# Patient Record
Sex: Female | Born: 1975
Health system: Southern US, Community
[De-identification: ages and names within clinical notes are randomized; demographics above are authoritative.]

## PROBLEM LIST (undated history)

## (undated) DIAGNOSIS — F329 Major depressive disorder, single episode, unspecified: Secondary | ICD-10-CM

## (undated) DIAGNOSIS — I2589 Other forms of chronic ischemic heart disease: Secondary | ICD-10-CM

## (undated) DIAGNOSIS — E079 Disorder of thyroid, unspecified: Secondary | ICD-10-CM

## (undated) DIAGNOSIS — I2585 Chronic coronary microvascular dysfunction: Secondary | ICD-10-CM

## (undated) DIAGNOSIS — B019 Varicella without complication: Secondary | ICD-10-CM

## (undated) DIAGNOSIS — F32A Depression, unspecified: Secondary | ICD-10-CM

## (undated) DIAGNOSIS — F419 Anxiety disorder, unspecified: Secondary | ICD-10-CM

## (undated) HISTORY — DX: Major depressive disorder, single episode, unspecified: F32.9

## (undated) HISTORY — DX: Depression, unspecified: F32.A

## (undated) HISTORY — DX: Disorder of thyroid, unspecified: E07.9

## (undated) HISTORY — DX: Varicella without complication: B01.9

## (undated) HISTORY — DX: Other forms of chronic ischemic heart disease: I25.89

## (undated) HISTORY — PX: THYROID SURGERY: SHX805

## (undated) HISTORY — DX: Chronic coronary microvascular dysfunction: I25.85

## (undated) HISTORY — PX: SEPTOPLASTY: SUR1290

## (undated) HISTORY — PX: TUBAL LIGATION: SHX77

## (undated) HISTORY — DX: Anxiety disorder, unspecified: F41.9

---

## 2007-08-15 HISTORY — PX: CHOLECYSTECTOMY: SHX55

## 2014-11-19 ENCOUNTER — Ambulatory Visit: Payer: Self-pay | Admitting: Internal Medicine

## 2014-12-29 ENCOUNTER — Encounter (INDEPENDENT_AMBULATORY_CARE_PROVIDER_SITE_OTHER): Payer: Self-pay

## 2014-12-29 ENCOUNTER — Ambulatory Visit (INDEPENDENT_AMBULATORY_CARE_PROVIDER_SITE_OTHER): Payer: Managed Care, Other (non HMO) | Admitting: Internal Medicine

## 2014-12-29 ENCOUNTER — Encounter: Payer: Self-pay | Admitting: Internal Medicine

## 2014-12-29 VITALS — BP 106/60 | HR 82 | Temp 98.2°F | Wt 237.0 lb

## 2014-12-29 DIAGNOSIS — R079 Chest pain, unspecified: Secondary | ICD-10-CM | POA: Diagnosis not present

## 2014-12-29 DIAGNOSIS — Z91048 Other nonmedicinal substance allergy status: Secondary | ICD-10-CM

## 2014-12-29 DIAGNOSIS — I209 Angina pectoris, unspecified: Secondary | ICD-10-CM

## 2014-12-29 DIAGNOSIS — F32A Depression, unspecified: Secondary | ICD-10-CM

## 2014-12-29 DIAGNOSIS — G43909 Migraine, unspecified, not intractable, without status migrainosus: Secondary | ICD-10-CM | POA: Insufficient documentation

## 2014-12-29 DIAGNOSIS — F329 Major depressive disorder, single episode, unspecified: Secondary | ICD-10-CM | POA: Insufficient documentation

## 2014-12-29 DIAGNOSIS — I2585 Chronic coronary microvascular dysfunction: Secondary | ICD-10-CM

## 2014-12-29 DIAGNOSIS — G43819 Other migraine, intractable, without status migrainosus: Secondary | ICD-10-CM

## 2014-12-29 DIAGNOSIS — G8929 Other chronic pain: Secondary | ICD-10-CM

## 2014-12-29 DIAGNOSIS — F419 Anxiety disorder, unspecified: Secondary | ICD-10-CM | POA: Insufficient documentation

## 2014-12-29 DIAGNOSIS — I2589 Other forms of chronic ischemic heart disease: Secondary | ICD-10-CM | POA: Insufficient documentation

## 2014-12-29 DIAGNOSIS — F418 Other specified anxiety disorders: Secondary | ICD-10-CM

## 2014-12-29 DIAGNOSIS — E079 Disorder of thyroid, unspecified: Secondary | ICD-10-CM | POA: Diagnosis not present

## 2014-12-29 DIAGNOSIS — Z9109 Other allergy status, other than to drugs and biological substances: Secondary | ICD-10-CM | POA: Insufficient documentation

## 2014-12-29 NOTE — Progress Notes (Signed)
Pre visit review using our clinic review tool, if applicable. No additional management support is needed unless otherwise documented below in the visit note. 

## 2014-12-29 NOTE — Assessment & Plan Note (Signed)
No reoccurance Will check TSH and Free T4 at physical

## 2014-12-29 NOTE — Progress Notes (Signed)
HPI  Pt presents to the clinic today to establish care and for management of the conditions listed below. She is transferring care from Dr. Senaida Oresichardson in [place].    Anxiety and Depression: She follows with [facility]. She takes Effexor daily. She is tapering off the Valium at this time. She is using Hydroxyzine daily in its place. She sees her psychiatrist once per month. She does plan on seeing a counselor once a week.  Microvascular heart disease: She follows with [facility] Alden Hippaubert at [facility]. Taking Norvasc, Coreg Zocor and ASA daily. She does have chest pain daily. She is taking the Isosorbide daily. She uses the Nitro 1-2 times a day when needed. She has chest pain on a daily basis but has "learned to live with it".  Chronic chest pain:Secondary to the microvascular disease. She follows with [facility] Azz[facility]Fras at [facility]. She reports the Isosorbide and Nitro are not helping all that much. Her pain management doctor is considering putting her on narcotics but insist that she be weaned off the Valium first.  Thyroid mass: She has had a partial thyroidectomy in 2011. She reports this was a benign mass.  Allergies: They occur all year long. She is taking Allegra daily with good relief.  Migraines: These are intermittent. She takes Topomax daily. She is not sure what triggers it. It is located on the right side of her head. She does have sensitivity to light and sound. She also has nausea and vomiting.   Insomnia: Takes Trazadone daily. Flu: 05/2014 Tetanus: > 10 years ago LMP: 12/28/2014 Pap Smear: She thinks it was in 2012 Dentist: biannually  Past Medical History  Diagnosis Date  . Chicken pox   . Depression   . Cardiac microvascular disease   . Thyroid mass     right side  . Anxiety     Current Outpatient Prescriptions  Medication Sig Dispense Refill  . amLODipine (NORVASC) 10 MG tablet Take 1 tablet by mouth daily.    Marland Kitchen. aspirin EC 81 MG tablet Take 1 tablet by mouth daily.    .  carvedilol (COREG) 25 MG tablet Take 1 tablet by mouth 2 (two) times daily.    . diazepam (VALIUM) 5 MG tablet Take 1 tablet by mouth daily.    Marland Kitchen. EPINEPHrine 0.3 mg/0.3 mL IJ SOAJ injection Inject 0.3 mg into the muscle as needed. For Anaphylaxis    . fexofenadine (ALLEGRA) 180 MG tablet Take 1 tablet by mouth daily.    . hydrOXYzine (ATARAX/VISTARIL) 25 MG tablet Take 1-2 tablets by mouth 3 (three) times daily as needed. For anxiety    . isosorbide dinitrate (ISORDIL) 20 MG tablet Take 1 tablet by mouth 3 (three) times daily.    . nitroGLYCERIN (NITROSTAT) 0.4 MG SL tablet Place 0.4 mg under the tongue every 5 (five) minutes as needed for chest pain.    . simvastatin (ZOCOR) 40 MG tablet Take 1 tablet by mouth daily.    Marland Kitchen. topiramate (TOPAMAX) 25 MG tablet Take 3 tablets by mouth at bedtime. Increase as instructed    . traZODone (DESYREL) 100 MG tablet Take 1.5 tablets by mouth at bedtime.    Marland Kitchen. venlafaxine XR (EFFEXOR-XR) 75 MG 24 hr capsule Take 3 capsules by mouth daily.     No current facility-administered medications for this visit.    Allergies  Allergen Reactions  . Fentanyl Swelling  . Mirtazapine Swelling  . Penicillins Anaphylaxis  . Sulfa Antibiotics Hives  . Ms Contin  [Morphine]  Swelling    Family History  Problem Relation Age of Onset  . Depression Sister   . Anxiety disorder Sister   . Cancer Paternal Uncle     Lung  . Depression Maternal Grandmother   . Heart disease Paternal Grandmother   . Stroke Paternal Grandmother   . Diabetes Paternal Grandmother   . Diabetes Paternal Grandfather     History   Social History  . Marital Status: Married    Spouse Name: N/A  . Number of Children: N/A  . Years of Education: N/A   Occupational History  . Not on file.   Social History Main Topics  . Smoking status: Current Every Day Smoker -- 0.50 packs/day    Types: Cigarettes  . Smokeless tobacco: Never Used  . Alcohol Use: 0.0 oz/week    0 Standard drinks or  equivalent per week     Comment: occasional  . Drug Use: No  . Sexual Activity: Not on file   Other Topics Concern  . Not on file   Social History Narrative  . No narrative on file    ROS:  Constitutional: Pt reports fatigue. Denies fever, malaise,  or abrupt weight changes.  HEENT: Denies eye pain, eye redness, ear pain, ringing in the ears, wax buildup, runny nose, nasal congestion, bloody nose, or sore throat. Respiratory: Denies difficulty breathing, shortness of breath, cough or sputum production.   Cardiovascular: Pt reports chest pain. Denies chest pain, chest tightness, palpitations or swelling in the hands or feet.  Gastrointestinal: Denies abdominal pain, bloating, constipation, diarrhea or blood in the stool.  Skin: Denies redness, rashes, lesions or ulcercations.  Neurological: Denies dizziness, difficulty with memory, difficulty with speech or problems with balance and coordination.  Psych: Pt reports anxiety and depression. Denies SI/HI.  No other specific complaints in a complete review of systems (except as listed in HPI above).  PE:  BP 106/60 mmHg  Pulse 82  Temp(Src) 98.2 F (36.8 C) (Oral)  Wt 237 lb (107.502 kg)  SpO2 98%  LMP 12/28/2014  Wt Readings from Last 3 Encounters:  12/29/14 237 lb (107.502 kg)    General: Appears her stated age, obesein NAD. HEENT: Head: normal shape and size; Eyes: sclera white, no icterus, conjunctiva pink, PERRLA and EOMs intact;  Neck:  Neck supple, trachea midline. No masses, lumps or thyromegaly present.  Cardiovascular: Normal rate and rhythm. S1,S2 noted.  No murmur, rubs or gallops noted. Pulmonary/Chest: Normal effort and positive vesicular breath sounds. No respiratory distress. No wheezes, rales or ronchi noted.  Neurological: Alert and oriented. Psychiatric: Mood and affect mildly flat. Behavior is normal. Judgment and thought content normal.     Assessment and Plan:

## 2014-12-29 NOTE — Assessment & Plan Note (Signed)
Not well controlled Continue to follow with pain management

## 2014-12-29 NOTE — Assessment & Plan Note (Signed)
Controlled on Topomax Advised her to try to keep a headache log so that we can identify some of her triggers

## 2014-12-29 NOTE — Patient Instructions (Signed)

## 2014-12-29 NOTE — Assessment & Plan Note (Signed)
She reports this is not well controlled on her current meds She has a follow up with her psychiatrist next week She will make an appt with Dr. Laymond PurserPerrin and start seeing her weekly

## 2014-12-29 NOTE — Assessment & Plan Note (Signed)
Continue Allegra daily 

## 2015-01-03 ENCOUNTER — Other Ambulatory Visit: Payer: Self-pay | Admitting: Internal Medicine

## 2015-01-03 DIAGNOSIS — Z0000 Encounter for general adult medical examination without abnormal findings: Secondary | ICD-10-CM

## 2015-01-04 ENCOUNTER — Other Ambulatory Visit (INDEPENDENT_AMBULATORY_CARE_PROVIDER_SITE_OTHER): Payer: Managed Care, Other (non HMO)

## 2015-01-04 DIAGNOSIS — Z0000 Encounter for general adult medical examination without abnormal findings: Secondary | ICD-10-CM

## 2015-01-04 LAB — COMPREHENSIVE METABOLIC PANEL
ALT: 10 U/L (ref 0–35)
AST: 14 U/L (ref 0–37)
Albumin: 4.1 g/dL (ref 3.5–5.2)
Alkaline Phosphatase: 62 U/L (ref 39–117)
BUN: 14 mg/dL (ref 6–23)
CO2: 26 mEq/L (ref 19–32)
CREATININE: 1.03 mg/dL (ref 0.40–1.20)
Calcium: 9.1 mg/dL (ref 8.4–10.5)
Chloride: 109 mEq/L (ref 96–112)
GFR: 63.53 mL/min (ref >60.00)
Glucose, Bld: 92 mg/dL (ref 70–99)
Potassium: 4 mEq/L (ref 3.5–5.1)
Sodium: 140 mEq/L (ref 135–145)
TOTAL PROTEIN: 7.2 g/dL (ref 6.0–8.3)
Total Bilirubin: 0.4 mg/dL (ref 0.2–1.2)

## 2015-01-04 LAB — LIPID PANEL
Cholesterol: 163 mg/dL (ref 0–200)
HDL: 57.7 mg/dL (ref >39.00)
LDL Cholesterol: 70 mg/dL (ref 0–99)
NONHDL: 105.3
Total CHOL/HDL Ratio: 3
Triglycerides: 175 mg/dL — ABNORMAL HIGH (ref 0.0–149.0)
VLDL: 35 mg/dL (ref 0.0–40.0)

## 2015-01-04 LAB — TSH: TSH: 1.48 u[IU]/mL (ref 0.35–4.50)

## 2015-01-04 LAB — HEMOGLOBIN A1C: Hgb A1c MFr Bld: 5.3 % (ref 4.6–6.5)

## 2015-01-04 LAB — CBC
HCT: 39.3 % (ref 36.0–46.0)
Hemoglobin: 13.4 g/dL (ref 12.0–15.0)
MCHC: 34.1 g/dL (ref 30.0–36.0)
MCV: 95.4 fl (ref 78.0–100.0)
PLATELETS: 337 10*3/uL (ref 150.0–400.0)
RBC: 4.12 Mil/uL (ref 3.87–5.11)
RDW: 13.6 % (ref 11.5–15.5)
WBC: 7 10*3/uL (ref 4.0–10.5)

## 2015-01-05 ENCOUNTER — Encounter: Payer: Managed Care, Other (non HMO) | Admitting: Internal Medicine

## 2015-01-06 ENCOUNTER — Other Ambulatory Visit (HOSPITAL_COMMUNITY)
Admission: RE | Admit: 2015-01-06 | Discharge: 2015-01-06 | Disposition: A | Discharge status: Home / Self Care | Payer: Managed Care, Other (non HMO) | Source: Ambulatory Visit | Attending: Internal Medicine | Admitting: Internal Medicine

## 2015-01-06 ENCOUNTER — Ambulatory Visit (INDEPENDENT_AMBULATORY_CARE_PROVIDER_SITE_OTHER): Payer: Managed Care, Other (non HMO) | Admitting: Internal Medicine

## 2015-01-06 ENCOUNTER — Encounter: Payer: Self-pay | Admitting: Internal Medicine

## 2015-01-06 VITALS — BP 108/70 | HR 100 | Temp 98.5°F | Ht 66.0 in | Wt 233.0 lb

## 2015-01-06 DIAGNOSIS — Z23 Encounter for immunization: Secondary | ICD-10-CM | POA: Diagnosis not present

## 2015-01-06 DIAGNOSIS — Z1151 Encounter for screening for human papillomavirus (HPV): Secondary | ICD-10-CM | POA: Diagnosis present

## 2015-01-06 DIAGNOSIS — R0989 Other specified symptoms and signs involving the circulatory and respiratory systems: Secondary | ICD-10-CM

## 2015-01-06 DIAGNOSIS — Z01419 Encounter for gynecological examination (general) (routine) without abnormal findings: Secondary | ICD-10-CM | POA: Insufficient documentation

## 2015-01-06 DIAGNOSIS — R198 Other specified symptoms and signs involving the digestive system and abdomen: Secondary | ICD-10-CM

## 2015-01-06 DIAGNOSIS — Z124 Encounter for screening for malignant neoplasm of cervix: Secondary | ICD-10-CM | POA: Diagnosis not present

## 2015-01-06 DIAGNOSIS — Z0000 Encounter for general adult medical examination without abnormal findings: Secondary | ICD-10-CM | POA: Diagnosis not present

## 2015-01-06 MED ORDER — SELENIUM SULFIDE 2.25 % EX SHAM: 1.0000 "application " | MEDICATED_SHAMPOO | Freq: Every day | CUTANEOUS | Status: DC | PRN

## 2015-01-06 NOTE — Patient Instructions (Signed)

## 2015-01-06 NOTE — Progress Notes (Signed)
Pre visit review using our clinic review tool, if applicable. No additional management support is needed unless otherwise documented below in the visit note. 

## 2015-01-06 NOTE — Progress Notes (Signed)
Subjective:    Patient ID: Dana Miller, female    DOB: [birth year], 39 y.o.   MRN: [id number]  HPI  Pt presents to the clinic today for her annual exam.  Flu: 05/2014 Tetanus: > 10 years ago Pap Smear: she thinks it was in 2012 Dentist: biannually  Review of Systems      Past Medical History  Diagnosis Date  . Chicken pox   . Depression   . Cardiac microvascular disease   . Thyroid mass     right side  . Anxiety     Current Outpatient Prescriptions  Medication Sig Dispense Refill  . amLODipine (NORVASC) 10 MG tablet Take 1 tablet by mouth daily.    Marland Kitchen aspirin EC 81 MG tablet Take 1 tablet by mouth daily.    . carvedilol (COREG) 25 MG tablet Take 1 tablet by mouth 2 (two) times daily.    . diazepam (VALIUM) 5 MG tablet Take 1 tablet by mouth daily.    Marland Kitchen EPINEPHrine 0.3 mg/0.3 mL IJ SOAJ injection Inject 0.3 mg into the muscle as needed. For Anaphylaxis    . fexofenadine (ALLEGRA) 180 MG tablet Take 1 tablet by mouth daily.    . isosorbide dinitrate (ISORDIL) 20 MG tablet Take 1 tablet by mouth 3 (three) times daily.    . nitroGLYCERIN (NITROSTAT) 0.4 MG SL tablet Place 0.4 mg under the tongue every 5 (five) minutes as needed for chest pain.    Marland Kitchen QUEtiapine (SEROQUEL) 50 MG tablet Take 25-50mg  twice a day as needed for anxiety/panic attacks. Take  nightly for insomnia.    Marland Kitchen simvastatin (ZOCOR) 40 MG tablet Take 1 tablet by mouth daily.    Marland Kitchen topiramate (TOPAMAX) 25 MG tablet Take 3 tablets by mouth at bedtime. Increase as instructed    . venlafaxine XR (EFFEXOR-XR) 75 MG 24 hr capsule Take 3 capsules by mouth daily.     No current facility-administered medications for this visit.    Allergies  Allergen Reactions  . Fentanyl Swelling  . Mirtazapine Swelling  . Penicillins Anaphylaxis  . Sulfa Antibiotics Hives  . Ms Contin  [Morphine] Swelling    Family History  Problem Relation Age of Onset  . Depression Sister   . Anxiety disorder Sister   . Cancer  Paternal Uncle     Lung  . Depression Maternal Grandmother   . Heart disease Paternal Grandmother   . Stroke Paternal Grandmother   . Diabetes Paternal Grandmother   . Diabetes Paternal Grandfather     History   Social History  . Marital Status: Married    Spouse Name: N/A  . Number of Children: N/A  . Years of Education: N/A   Occupational History  . Not on file.   Social History Main Topics  . Smoking status: Current Every Day Smoker -- 0.50 packs/day    Types: Cigarettes  . Smokeless tobacco: Never Used  . Alcohol Use: 0.0 oz/week    0 Standard drinks or equivalent per week     Comment: occasional  . Drug Use: No  . Sexual Activity: Yes   Other Topics Concern  . Not on file   Social History Narrative     Constitutional: Pt reports fatigue. Denies fever, malaise, headache or abrupt weight changes.  HEENT: Denies eye pain, eye redness, ear pain, ringing in the ears, wax buildup, runny nose, nasal congestion, bloody nose, or sore throat. Respiratory: Denies difficulty breathing, shortness of breath, cough or sputum production.   Cardiovascular: Pt  reports chronic chest pain. Denies chest tightness, palpitations or swelling in the hands or feet.  Gastrointestinal: Denies abdominal pain, bloating, constipation, diarrhea or blood in the stool.  GU: Denies urgency, frequency, pain with urination, burning sensation, blood in urine, odor or discharge. Musculoskeletal: Denies decrease in range of motion, difficulty with gait, muscle pain or joint pain and swelling.  Skin: Denies redness, rashes, lesions or ulcercations.  Neurological: Denies dizziness, difficulty with memory, difficulty with speech or problems with balance and coordination.  Psych: Pt reports anxiety and depression. Denies SI/HI.  No other specific complaints in a complete review of systems (except as listed in HPI above).  Objective:   Physical Exam  BP 108/70 mmHg  Pulse 100  Temp(Src) 98.5 F (36.9  C) (Oral)  Ht 5\' 6"  (1.676 m)  Wt 233 lb (105.688 kg)  BMI 37.63 kg/m2  SpO2 98%  LMP 12/28/2014 Wt Readings from Last 3 Encounters:  01/06/15 233 lb (105.688 kg)  12/29/14 237 lb (107.502 kg)    General: Appears her stated age, obese in NAD. Skin: Warm, dry and intact. No rashes or ulcerations noted. HEENT: Head: normal shape and size; Eyes: sclera white, no icterus, conjunctiva pink, PERRLA and EOMs intact; Ears: Tm's gray and intact, normal light reflex; Throat/Mouth: Teeth present, mucosa pink and moist, no exudate, lesions or ulcerations noted. Small growth noted on right tonsil. Neck: Neck supple, trachea midline. No masses, lumps or thyromegaly present. Surgical scar noted on anterior neck. Cardiovascular: Normal rate and rhythm. S1,S2 noted.  No murmur, rubs or gallops noted. No JVD or BLE edema.  Pulmonary/Chest: Normal effort and positive vesicular breath sounds. No respiratory distress. No wheezes, rales or ronchi noted.  Abdomen: Soft and nontender. Normal bowel sounds, no bruits noted. No distention or masses noted. Liver, spleen and kidneys non palpable. Musculoskeletal: Strength 5/5 BUE/BLE. No signs of joint swelling. No difficulty with gait.  Neurological: Alert and oriented. Cranial nerves II-XII grossly intact. Coordination normal.  Psychiatric: Mood and affect mildly flat. Behavior is normal. Judgment and thought content normal.     BMET    Component Value Date/Time   NA 140 01/04/2015 1122   K 4.0 01/04/2015 1122   CL 109 01/04/2015 1122   CO2 26 01/04/2015 1122   GLUCOSE 92 01/04/2015 1122   BUN 14 01/04/2015 1122   CREATININE 1.03 01/04/2015 1122   CALCIUM 9.1 01/04/2015 1122    Lipid Panel     Component Value Date/Time   CHOL 163 01/04/2015 1122   TRIG 175.0* 01/04/2015 1122   HDL 57.70 01/04/2015 1122   CHOLHDL 3 01/04/2015 1122   VLDL 35.0 01/04/2015 1122   LDLCALC 70 01/04/2015 1122    CBC    Component Value Date/Time   WBC 7.0 01/04/2015  1122   RBC 4.12 01/04/2015 1122   HGB 13.4 01/04/2015 1122   HCT 39.3 01/04/2015 1122   PLT 337.0 01/04/2015 1122   MCV 95.4 01/04/2015 1122   MCHC 34.1 01/04/2015 1122   RDW 13.6 01/04/2015 1122    Hgb A1C Lab Results  Component Value Date   HGBA1C 5.3 01/04/2015         Assessment & Plan:   Preventative Health Maintenance:  CBC, CMET, Lipid, TSH and A1C reviewed, slightly elevated triglycerides, advised her to work on low fat diet and start taking fish oil daily Tdap today Pap Smear today, will call you with the results All other HM UTD  Tonsil growth:  Will place referral to ENT  to r/o neoplasm  RTC in 1 year or sooner if needed

## 2015-01-06 NOTE — Addendum Note (Signed)
Addended by: Roena MaladyEVONTENNO, MELANIE Y on: 01/06/2015 04:06 PM   Modules accepted: Orders

## 2015-01-06 NOTE — Addendum Note (Signed)
Addended by: Roena MaladyEVONTENNO, Danyell Awbrey Y on: 01/06/2015 04:04 PM   Modules accepted: Orders

## 2015-01-12 LAB — CYTOLOGY - PAP

## 2015-02-03 ENCOUNTER — Ambulatory Visit (INDEPENDENT_AMBULATORY_CARE_PROVIDER_SITE_OTHER): Payer: 59 | Admitting: Psychology

## 2015-02-03 DIAGNOSIS — F332 Major depressive disorder, recurrent severe without psychotic features: Secondary | ICD-10-CM | POA: Diagnosis not present

## 2015-02-17 ENCOUNTER — Ambulatory Visit (INDEPENDENT_AMBULATORY_CARE_PROVIDER_SITE_OTHER): Payer: 59 | Admitting: Psychology

## 2015-02-17 DIAGNOSIS — F4323 Adjustment disorder with mixed anxiety and depressed mood: Secondary | ICD-10-CM | POA: Diagnosis not present

## 2015-03-04 ENCOUNTER — Ambulatory Visit: Payer: 59 | Admitting: Psychology

## 2015-03-31 ENCOUNTER — Ambulatory Visit (INDEPENDENT_AMBULATORY_CARE_PROVIDER_SITE_OTHER): Payer: 59 | Admitting: Psychology

## 2015-03-31 DIAGNOSIS — F323 Major depressive disorder, single episode, severe with psychotic features: Secondary | ICD-10-CM

## 2015-04-06 ENCOUNTER — Ambulatory Visit (INDEPENDENT_AMBULATORY_CARE_PROVIDER_SITE_OTHER): Payer: 59 | Admitting: Psychology

## 2015-04-06 DIAGNOSIS — F4323 Adjustment disorder with mixed anxiety and depressed mood: Secondary | ICD-10-CM

## 2015-04-13 ENCOUNTER — Ambulatory Visit: Payer: 59 | Admitting: Psychology

## 2015-04-20 ENCOUNTER — Ambulatory Visit: Payer: Managed Care, Other (non HMO) | Admitting: Internal Medicine

## 2015-04-20 ENCOUNTER — Telehealth: Payer: Self-pay | Admitting: Internal Medicine

## 2015-04-20 NOTE — Telephone Encounter (Signed)
No follow up needed

## 2015-04-20 NOTE — Telephone Encounter (Signed)
Pt did not come in for their appt today for acute visit. Please let me know if pt needs to be contacted immediately for follow up or no follow up needed. Best phone number to contact pt is [phone].

## 2015-04-21 ENCOUNTER — Ambulatory Visit (INDEPENDENT_AMBULATORY_CARE_PROVIDER_SITE_OTHER): Payer: Self-pay | Admitting: Internal Medicine

## 2015-04-21 ENCOUNTER — Ambulatory Visit: Payer: 59 | Admitting: Psychology

## 2015-04-21 ENCOUNTER — Encounter: Payer: Self-pay | Admitting: Internal Medicine

## 2015-04-21 VITALS — BP 112/70 | HR 91 | Temp 98.6°F | Wt 237.0 lb

## 2015-04-21 DIAGNOSIS — E669 Obesity, unspecified: Secondary | ICD-10-CM

## 2015-04-21 DIAGNOSIS — E079 Disorder of thyroid, unspecified: Secondary | ICD-10-CM

## 2015-04-21 NOTE — Patient Instructions (Signed)

## 2015-04-21 NOTE — Progress Notes (Signed)
Pre visit review using our clinic review tool, if applicable. No additional management support is needed unless otherwise documented below in the visit note. 

## 2015-04-21 NOTE — Progress Notes (Signed)
Subjective:    Patient ID: Dana Miller, female    DOB: 1976/05/31, 39 y.o.   MRN: [id number]  HPI  Pt presents to the clinic today with c/o weight gain. This started 1.5 years ago and has plateud in the last 6 months. She feels like her appetite is decreased but she continues to gain weight. Most days she only eats 1 meal per day. She cut sodas out her diet. She does not consume junk food. Her weight today is 237 with a BMI of 38.25. She is not exercising at this time.   Breakfast: 1 cup of coffee Lunch: Typically does not eat Dinner: Chicken, potato's, rice.   She does not eat out. She does not cook with butter or salt. She does not fry foods. She does not understand why she eats so little and still does not loose weight.  She is also concerned about a possible mass on her thyroid. She noticed this about 6 months ago but has noticed that it has gotten bigger. She feels like it is putting pressure on her esophagus. She has had the right side of her thyroid removed secondary to a mass. Her TSH was checked a few months ago and was normal.  Review of Systems      Past Medical History  Diagnosis Date  . Chicken pox   . Depression   . Cardiac microvascular disease   . Thyroid mass     right side  . Anxiety     Current Outpatient Prescriptions  Medication Sig Dispense Refill  . amLODipine (NORVASC) 10 MG tablet Take 1 tablet by mouth daily.    Marland Kitchen aspirin EC 81 MG tablet Take 1 tablet by mouth daily.    Marland Kitchen atorvastatin (LIPITOR) 20 MG tablet Take 1 tablet by mouth daily.  11  . carvedilol (COREG) 25 MG tablet Take 1 tablet by mouth 2 (two) times daily.    Marland Kitchen EPINEPHrine 0.3 mg/0.3 mL IJ SOAJ injection Inject 0.3 mg into the muscle as needed. For Anaphylaxis    . fexofenadine (ALLEGRA) 180 MG tablet Take 1 tablet by mouth daily.    . isosorbide dinitrate (ISORDIL) 20 MG tablet Take 1 tablet by mouth 3 (three) times daily.    . nitroGLYCERIN (NITROSTAT) 0.4 MG SL tablet Place 0.4 mg  under the tongue every 5 (five) minutes as needed for chest pain.    Marland Kitchen QUEtiapine (SEROQUEL) 50 MG tablet Take 25-50mg  twice a day as needed for anxiety/panic attacks. Take  nightly for insomnia.    Marland Kitchen RANEXA 500 MG 12 hr tablet Take 1 tablet by mouth 2 (two) times daily.  11  . Selenium Sulfide 2.25 % SHAM Apply 1 application topically daily as needed. 1 Bottle 3  . topiramate (TOPAMAX) 25 MG tablet Take 3 tablets by mouth at bedtime. Increase as instructed    . venlafaxine XR (EFFEXOR-XR) 75 MG 24 hr capsule Take 3 capsules by mouth daily.     No current facility-administered medications for this visit.    Allergies  Allergen Reactions  . Fentanyl Swelling  . Mirtazapine Swelling  . Penicillins Anaphylaxis  . Sulfa Antibiotics Hives  . Ms Contin  [Morphine] Swelling    Family History  Problem Relation Age of Onset  . Depression Sister   . Anxiety disorder Sister   . Cancer Paternal Uncle     Lung  . Depression Maternal Grandmother   . Heart disease Paternal Grandmother   . Stroke Paternal Grandmother   .  Diabetes Paternal Grandmother   . Diabetes Paternal Grandfather     Social History   Social History  . Marital Status: Married    Spouse Name: N/A  . Number of Children: N/A  . Years of Education: N/A   Occupational History  . Not on file.   Social History Main Topics  . Smoking status: Current Every Day Smoker -- 0.50 packs/day    Types: Cigarettes  . Smokeless tobacco: Never Used  . Alcohol Use: 0.0 oz/week    0 Standard drinks or equivalent per week     Comment: occasional  . Drug Use: No  . Sexual Activity: Yes   Other Topics Concern  . Not on file   Social History Narrative     Constitutional: Denies fever, malaise, fatigue, headache or abrupt weight changes.  Respiratory: Denies difficulty breathing, shortness of breath, cough or sputum production.   Cardiovascular: Denies chest pain, chest tightness, palpitations or swelling in the hands or  feet.  Skin: Pt reports thyroid mass. Denies redness, rashes, lesions or ulcercations.   No other specific complaints in a complete review of systems (except as listed in HPI above).  Objective:   Physical Exam   BP 112/70 mmHg  Pulse 91  Temp(Src) 98.6 F (37 C) (Oral)  Wt 237 lb (107.502 kg)  SpO2 98%  LMP 03/30/2015 Wt Readings from Last 3 Encounters:  04/21/15 237 lb (107.502 kg)  01/06/15 233 lb (105.688 kg)  12/29/14 237 lb (107.502 kg)    General: Appears her stated age, obese in NAD. Neck:  Neck supple, trachea midline. Small, palpable, oval mass noted at top of left thyroid. Cardiovascular: Normal rate and rhythm. S1,S2 noted.   Pulmonary/Chest: Normal effort and positive vesicular breath sounds. No respiratory distress. No wheezes, rales or ronchi noted.  Neurological: Alert and oriented.   BMET    Component Value Date/Time   NA 140 01/04/2015 1122   K 4.0 01/04/2015 1122   CL 109 01/04/2015 1122   CO2 26 01/04/2015 1122   GLUCOSE 92 01/04/2015 1122   BUN 14 01/04/2015 1122   CREATININE 1.03 01/04/2015 1122   CALCIUM 9.1 01/04/2015 1122    Lipid Panel     Component Value Date/Time   CHOL 163 01/04/2015 1122   TRIG 175.0* 01/04/2015 1122   HDL 57.70 01/04/2015 1122   CHOLHDL 3 01/04/2015 1122   VLDL 35.0 01/04/2015 1122   LDLCALC 70 01/04/2015 1122    CBC    Component Value Date/Time   WBC 7.0 01/04/2015 1122   RBC 4.12 01/04/2015 1122   HGB 13.4 01/04/2015 1122   HCT 39.3 01/04/2015 1122   PLT 337.0 01/04/2015 1122   MCV 95.4 01/04/2015 1122   MCHC 34.1 01/04/2015 1122   RDW 13.6 01/04/2015 1122    Hgb A1C Lab Results  Component Value Date   HGBA1C 5.3 01/04/2015        Assessment & Plan:   Palpable mass of thyroid:  Given location, could be a lymph node TSH reviewed Will obtain thyroid ultrasound- see Shirlee Limerick on your way out to schedule  Obesity:  Discussed how eating only once a day decreases her metabolism and stores  fat Encouraged her to eat at least 3-4 small meals per day Advised her to incorporate more fruits and veggies into her diet Referral placed for a nutritionist- see Shirlee Limerick on your way out to schedule  RTC as needed

## 2015-04-26 ENCOUNTER — Ambulatory Visit
Admission: RE | Admit: 2015-04-26 | Discharge: 2015-04-26 | Disposition: A | Discharge status: Home / Self Care | Payer: Self-pay | Source: Ambulatory Visit | Attending: Internal Medicine | Admitting: Internal Medicine

## 2015-04-26 DIAGNOSIS — E041 Nontoxic single thyroid nodule: Secondary | ICD-10-CM | POA: Insufficient documentation

## 2015-04-26 DIAGNOSIS — E079 Disorder of thyroid, unspecified: Secondary | ICD-10-CM

## 2015-06-06 ENCOUNTER — Encounter: Payer: Self-pay | Admitting: Gynecology

## 2015-06-06 ENCOUNTER — Ambulatory Visit
Admission: EM | Admit: 2015-06-06 | Discharge: 2015-06-06 | Disposition: A | Discharge status: Home / Self Care | Payer: BLUE CROSS/BLUE SHIELD | Attending: Family Medicine | Admitting: Family Medicine

## 2015-06-06 ENCOUNTER — Ambulatory Visit (INDEPENDENT_AMBULATORY_CARE_PROVIDER_SITE_OTHER): Payer: BLUE CROSS/BLUE SHIELD

## 2015-06-06 DIAGNOSIS — M79671 Pain in right foot: Secondary | ICD-10-CM

## 2015-06-06 NOTE — ED Provider Notes (Signed)
H and presents today with symptoms of right foot pain for the past 4-6 weeks. Patient denies any injury to the right foot. She does admit that she has gained quite a bit of weight recently and has started to coach more with volleyball. She has pain now that is radiating to the lateral ankle and also to the anterior lower leg area. She denies any swelling or burning/tingling of the foot. She denies any previous injury such as stress fracture to the foot.  ROS: Negative except mentioned above. Vitals as per Epic.  GENERAL: NAD RESP: CTA B CARD: RRR MSK: R Foot-no deformity appreciated, mild pes cavus, tenderness primarily over the 4th-5th MTP region dorsally, mild discomfort along ATFL and anterior tib. area, FROM, nv intact   A/P: R Foot Pain-no obvious bony abnormality noted on x-ray, recommend patient follow up with podiatry for further imaging, workup/treatment. Will put patient in walking boot at this time, patient states that she cannot take NSAIDs due to her heart medications, will take Tylenol for now and ice when necessary. Copy of Xrays given to patient on disc.   Jolene ProvostKirtida Kynslie Ringle, MD 06/06/15 [phone]

## 2015-06-06 NOTE — ED Notes (Signed)
Patient c/o right foot pain x 4-6 weeks. Per pt. No injury to her right foot. Patient stated unable to walk on foot and painful.

## 2015-07-14 ENCOUNTER — Ambulatory Visit (INDEPENDENT_AMBULATORY_CARE_PROVIDER_SITE_OTHER): Payer: BLUE CROSS/BLUE SHIELD | Admitting: Psychology

## 2015-07-14 DIAGNOSIS — F331 Major depressive disorder, recurrent, moderate: Secondary | ICD-10-CM

## 2015-07-20 ENCOUNTER — Encounter: Payer: Self-pay | Admitting: Internal Medicine

## 2015-07-20 ENCOUNTER — Ambulatory Visit (INDEPENDENT_AMBULATORY_CARE_PROVIDER_SITE_OTHER): Payer: BLUE CROSS/BLUE SHIELD | Admitting: Internal Medicine

## 2015-07-20 VITALS — BP 116/70 | HR 87 | Temp 98.4°F | Wt 236.0 lb

## 2015-07-20 DIAGNOSIS — R238 Other skin changes: Secondary | ICD-10-CM | POA: Diagnosis not present

## 2015-07-20 DIAGNOSIS — R233 Spontaneous ecchymoses: Secondary | ICD-10-CM

## 2015-07-20 DIAGNOSIS — M25571 Pain in right ankle and joints of right foot: Secondary | ICD-10-CM | POA: Diagnosis not present

## 2015-07-20 DIAGNOSIS — R635 Abnormal weight gain: Secondary | ICD-10-CM

## 2015-07-20 DIAGNOSIS — E559 Vitamin D deficiency, unspecified: Secondary | ICD-10-CM

## 2015-07-20 DIAGNOSIS — L603 Nail dystrophy: Secondary | ICD-10-CM

## 2015-07-20 LAB — CBC WITH DIFFERENTIAL/PLATELET
BASOS ABS: 0 10*3/uL (ref 0.0–0.1)
Basophils Relative: 0.4 % (ref 0.0–3.0)
EOS PCT: 3.3 % (ref 0.0–5.0)
Eosinophils Absolute: 0.3 10*3/uL (ref 0.0–0.7)
HEMATOCRIT: 36.8 % (ref 36.0–46.0)
HEMOGLOBIN: 12.4 g/dL (ref 12.0–15.0)
LYMPHS PCT: 16.9 % (ref 12.0–46.0)
Lymphs Abs: 1.4 10*3/uL (ref 0.7–4.0)
MCHC: 33.6 g/dL (ref 30.0–36.0)
MCV: 97.6 fl (ref 78.0–100.0)
MONOS PCT: 6 % (ref 3.0–12.0)
Monocytes Absolute: 0.5 10*3/uL (ref 0.1–1.0)
NEUTROS PCT: 73.4 % (ref 43.0–77.0)
Neutro Abs: 6.1 10*3/uL (ref 1.4–7.7)
Platelets: 296 10*3/uL (ref 150.0–400.0)
RBC: 3.77 Mil/uL — AB (ref 3.87–5.11)
RDW: 13.6 % (ref 11.5–15.5)
WBC: 8.3 10*3/uL (ref 4.0–10.5)

## 2015-07-20 LAB — COMPREHENSIVE METABOLIC PANEL
ALBUMIN: 3.7 g/dL (ref 3.5–5.2)
ALK PHOS: 71 U/L (ref 39–117)
ALT: 7 U/L (ref 0–35)
AST: 12 U/L (ref 0–37)
BILIRUBIN TOTAL: 0.3 mg/dL (ref 0.2–1.2)
BUN: 9 mg/dL (ref 6–23)
CO2: 23 meq/L (ref 19–32)
Calcium: 8.7 mg/dL (ref 8.4–10.5)
Chloride: 111 mEq/L (ref 96–112)
Creatinine, Ser: 1.12 mg/dL (ref 0.40–1.20)
GFR: 57.52 mL/min — AB (ref >60.00)
Glucose, Bld: 89 mg/dL (ref 70–99)
Potassium: 3.9 mEq/L (ref 3.5–5.1)
Sodium: 141 mEq/L (ref 135–145)
TOTAL PROTEIN: 6.9 g/dL (ref 6.0–8.3)

## 2015-07-20 LAB — VITAMIN D 25 HYDROXY (VIT D DEFICIENCY, FRACTURES): VITD: 13.27 ng/mL — AB (ref 30.00–100.00)

## 2015-07-20 LAB — TSH: TSH: 2.77 u[IU]/mL (ref 0.35–4.50)

## 2015-07-20 LAB — FOLATE: Folate: 7.5 ng/mL (ref >5.9)

## 2015-07-20 LAB — VITAMIN B12: VITAMIN B 12: 149 pg/mL — AB (ref 211–911)

## 2015-07-20 NOTE — Progress Notes (Signed)
Pre visit review using our clinic review tool, if applicable. No additional management support is needed unless otherwise documented below in the visit note. 

## 2015-07-20 NOTE — Progress Notes (Signed)
Subjective:    Patient ID: Dana Miller, female    DOB: [birth year], 39 y.o.   MRN: [id number]  HPI  Pt presents to the clinic today with multiple concerns. She recently injured her right foot out of nowhere. She was placed in a boot by podiatry after 9 weeks of pain in her right foot. She reports there is no fracture but she is not sure why she would all of a sudden develop pain like this. She also reports easy brusing. She had a bruise on the back of her right leg about the size of a grapefruit. She does not recall hitting her leg on anything. The bruise has resolved. She reports brittle nails. Normally, her nails and long and hard but for some reason, they are breaking off. She also reports that she is unable to lose weight despite the fact that she does not eat that much. She reports that she consumes a balanced diet but she does not exercise. It the last 6 months, she has lost 1 lb.  Review of Systems      Past Medical History  Diagnosis Date  . Chicken pox   . Depression   . Cardiac microvascular disease (HCC)   . Thyroid mass     right side  . Anxiety     Current Outpatient Prescriptions  Medication Sig Dispense Refill  . aspirin EC 81 MG tablet Take 1 tablet by mouth daily.    Marland Kitchen. atorvastatin (LIPITOR) 20 MG tablet Take 1 tablet by mouth daily.  11  . EPINEPHrine 0.3 mg/0.3 mL IJ SOAJ injection Inject 0.3 mg into the muscle as needed. For Anaphylaxis    . fexofenadine (ALLEGRA) 180 MG tablet Take 1 tablet by mouth daily.    . isosorbide dinitrate (ISORDIL) 20 MG tablet Take 1 tablet by mouth 3 (three) times daily.    . nitroGLYCERIN (NITROSTAT) 0.4 MG SL tablet Place 0.4 mg under the tongue every 5 (five) minutes as needed for chest pain.    Marland Kitchen. QUEtiapine (SEROQUEL) 50 MG tablet Take 25-50mg  twice a day as needed for anxiety/panic attacks. Take 50mg  nightly for insomnia.    Marland Kitchen. RANEXA 500 MG 12 hr tablet Take 1 tablet by mouth 2 (two) times daily.  11  . Selenium Sulfide 2.25 %  SHAM Apply 1 application topically daily as needed. 1 Bottle 3  . topiramate (TOPAMAX) 25 MG tablet Take 3 tablets by mouth at bedtime. Increase as instructed    . venlafaxine XR (EFFEXOR-XR) 75 MG 24 hr capsule Take 3 capsules by mouth daily.    Marland Kitchen. amLODipine (NORVASC) 10 MG tablet Take 1 tablet by mouth daily.    . carvedilol (COREG) 25 MG tablet Take 1 tablet by mouth 2 (two) times daily.     No current facility-administered medications for this visit.    Allergies  Allergen Reactions  . Fentanyl Swelling  . Mirtazapine Swelling  . Penicillins Anaphylaxis  . Sulfa Antibiotics Hives  . Ms Contin  [Morphine] Swelling    Family History  Problem Relation Age of Onset  . Depression Sister   . Anxiety disorder Sister   . Cancer Paternal Uncle     Lung  . Depression Maternal Grandmother   . Heart disease Paternal Grandmother   . Stroke Paternal Grandmother   . Diabetes Paternal Grandmother   . Diabetes Paternal Grandfather     Social History   Social History  . Marital Status: Married    Spouse Name: N/A  .  Number of Children: N/A  . Years of Education: N/A   Occupational History  . Not on file.   Social History Main Topics  . Smoking status: Current Every Day Smoker -- 0.50 packs/day    Types: Cigarettes  . Smokeless tobacco: Never Used  . Alcohol Use: 0.0 oz/week    0 Standard drinks or equivalent per week     Comment: occasional  . Drug Use: No  . Sexual Activity: Yes   Other Topics Concern  . Not on file   Social History Narrative     Constitutional: Pt reports weight gain. Denies fever, malaise, fatigue, headache.  Respiratory: Denies difficulty breathing, shortness of breath, cough or sputum production.   Cardiovascular: Denies chest pain, chest tightness, palpitations or swelling in the hands or feet.  Gastrointestinal: Denies abdominal pain, bloating, constipation, diarrhea or blood in the stool.  GU: Denies urgency, frequency, pain with urination,  burning sensation, blood in urine, odor or discharge. Musculoskeletal: Pt reports foot pain. Denies decrease in range of motion, difficulty with gait, muscle pain or joint swelling.  Skin: Pt reports easy bruising and brittle nails. Denies redness, rashes, lesions or ulcercations.  Neurological: Denies dizziness, difficulty with memory, difficulty with speech or problems with balance and coordination.  Psych: Pt reports stress and depression. Denies anxiety, SI/HI.  No other specific complaints in a complete review of systems (except as listed in HPI above).  Objective:   Physical Exam  BP 116/70 mmHg  Pulse 87  Temp(Src) 98.4 F (36.9 C) (Oral)  Wt 236 lb (107.049 kg)  SpO2 98% Wt Readings from Last 3 Encounters:  07/20/15 236 lb (107.049 kg)  06/06/15 235 lb (106.595 kg)  04/21/15 237 lb (107.502 kg)    General: Appears her stated age, obese in NAD. Skin: Warm, dry and intact. Nails are different lengths but they appear strong, no ridges noted. No bruises noted. Cardiovascular: Normal rate and rhythm. S1,S2 noted.  No murmur, rubs or gallops noted. . Pulmonary/Chest: Normal effort and positive vesicular breath sounds. No respiratory distress. No wheezes, rales or ronchi noted.  Musculoskeletal: Unable to assess right foot, in boot.  Neurological: Alert and oriented.  Psychiatric: She seems anxious today.  BMET    Component Value Date/Time   NA 140 01/04/2015 1122   K 4.0 01/04/2015 1122   CL 109 01/04/2015 1122   CO2 26 01/04/2015 1122   GLUCOSE 92 01/04/2015 1122   BUN 14 01/04/2015 1122   CREATININE 1.03 01/04/2015 1122   CALCIUM 9.1 01/04/2015 1122    Lipid Panel     Component Value Date/Time   CHOL 163 01/04/2015 1122   TRIG 175.0* 01/04/2015 1122   HDL 57.70 01/04/2015 1122   CHOLHDL 3 01/04/2015 1122   VLDL 35.0 01/04/2015 1122   LDLCALC 70 01/04/2015 1122    CBC    Component Value Date/Time   WBC 7.0 01/04/2015 1122   RBC 4.12 01/04/2015 1122   HGB  13.4 01/04/2015 1122   HCT 39.3 01/04/2015 1122   PLT 337.0 01/04/2015 1122   MCV 95.4 01/04/2015 1122   MCHC 34.1 01/04/2015 1122   RDW 13.6 01/04/2015 1122    Hgb A1C Lab Results  Component Value Date   HGBA1C 5.3 01/04/2015         Assessment & Plan:   Joint pain, easy bruising, brittle fingernails, weight gain:  Will check CBC, CMET, TSH, Vit D, B12, Folate ? Weight gain d/t seroquel versus effexor  Will follow up after  labs come back, RTC as needed

## 2015-07-20 NOTE — Patient Instructions (Signed)
Vitamin D Deficiency Vitamin D deficiency is when your body does not have enough vitamin D. Vitamin D is important to your body for many reasons:  It helps the body to absorb two important minerals, called calcium and phosphorus.  It plays a role in bone health.  It may help to prevent some diseases, such as diabetes and multiple sclerosis.  It plays a role in muscle function, including heart function. You can get vitamin D by:  Eating foods that naturally contain vitamin D.  Eating or drinking milk or other dairy products that have vitamin D added to them.  Taking a vitamin D supplement or a multivitamin supplement that contains vitamin D.  Being in the sun. Your body naturally makes vitamin D when your skin is exposed to sunlight. Your body changes the sunlight into a form of the vitamin that the body can use. If vitamin D deficiency is severe, it can cause a condition in which your bones become soft. In adults, this condition is called osteomalacia. In children, this condition is called rickets. CAUSES Vitamin D deficiency may be caused by:  Not eating enough foods that contain vitamin D.  Not getting enough sun exposure.  Having certain digestive system diseases that make it difficult for your body to absorb vitamin D. These diseases include Crohn disease, chronic pancreatitis, and cystic fibrosis.  Having a surgery in which a part of the stomach or a part of the small intestine is removed.  Being obese.  Having chronic kidney disease or liver disease. RISK FACTORS This condition is more likely to develop in:  Older people.  People who do not spend much time outdoors.  People who live in a long-term care facility.  People who have had broken bones.  People with weak or thin bones (osteoporosis).  People who have a disease or condition that changes how the body absorbs vitamin D.  People who have dark skin.  People who take certain medicines, such as steroid  medicines or certain seizure medicines.  People who are overweight or obese. SYMPTOMS In mild cases of vitamin D deficiency, there may not be any symptoms. If the condition is severe, symptoms may include:  Bone pain.  Muscle pain.  Falling often.  Broken bones caused by a minor injury. DIAGNOSIS This condition is usually diagnosed with a blood test.  TREATMENT Treatment for this condition may depend on what caused the condition. Treatment options include:  Taking vitamin D supplements.  Taking a calcium supplement. Your health care provider will suggest what dose is best for you. HOME CARE INSTRUCTIONS  Take medicines and supplements only as told by your health care provider.  Eat foods that contain vitamin D. Choices include:  Fortified dairy products, cereals, or juices. Fortified means that vitamin D has been added to the food. Check the label on the package to be sure.  Fatty fish, such as salmon or trout.  Eggs.  Oysters.  Do not use a tanning bed.  Maintain a healthy weight. Lose weight, if needed.  Keep all follow-up visits as told by your health care provider. This is important. SEEK MEDICAL CARE IF:  Your symptoms do not go away.  You feel like throwing up (nausea) or you throw up (vomit).  You have fewer bowel movements than usual or it is difficult for you to have a bowel movement (constipation).   This information is not intended to replace advice given to you by your health care provider. Make sure you discuss   any questions you have with your health care provider.   Document Released: 10/23/2011 Document Revised: 04/21/2015 Document Reviewed: 12/16/2014 Elsevier Interactive Patient Education 2016 [employer]  

## 2015-07-21 MED ORDER — VITAMIN D (ERGOCALCIFEROL) 1.25 MG (50000 UNIT) PO CAPS: 50000.0000 [IU] | ORAL_CAPSULE | ORAL | Status: DC

## 2015-07-21 NOTE — Addendum Note (Signed)
Addended by: Roena MaladyEVONTENNO, Evert Wenrich Y on: 07/21/2015 11:34 AM   Modules accepted: Orders

## 2015-07-22 ENCOUNTER — Ambulatory Visit (INDEPENDENT_AMBULATORY_CARE_PROVIDER_SITE_OTHER): Payer: BLUE CROSS/BLUE SHIELD | Admitting: *Deleted

## 2015-07-22 DIAGNOSIS — E538 Deficiency of other specified B group vitamins: Secondary | ICD-10-CM | POA: Diagnosis not present

## 2015-07-22 MED ORDER — CYANOCOBALAMIN 1000 MCG/ML IJ SOLN
1000.0000 ug | Freq: Once | INTRAMUSCULAR | Status: AC
  Administered 2015-07-22: 1000 ug via INTRAMUSCULAR

## 2015-07-27 ENCOUNTER — Ambulatory Visit (INDEPENDENT_AMBULATORY_CARE_PROVIDER_SITE_OTHER): Payer: BLUE CROSS/BLUE SHIELD | Admitting: Psychology

## 2015-07-27 DIAGNOSIS — F4323 Adjustment disorder with mixed anxiety and depressed mood: Secondary | ICD-10-CM | POA: Diagnosis not present

## 2015-08-24 ENCOUNTER — Ambulatory Visit: Payer: Self-pay

## 2015-08-27 ENCOUNTER — Ambulatory Visit: Payer: Self-pay

## 2015-09-01 ENCOUNTER — Ambulatory Visit: Payer: Self-pay | Admitting: Internal Medicine

## 2015-09-03 ENCOUNTER — Other Ambulatory Visit: Payer: Self-pay

## 2015-09-03 DIAGNOSIS — E559 Vitamin D deficiency, unspecified: Secondary | ICD-10-CM

## 2015-09-03 MED ORDER — VITAMIN D (ERGOCALCIFEROL) 1.25 MG (50000 UNIT) PO CAPS: 50000.0000 [IU] | ORAL_CAPSULE | ORAL | Status: DC

## 2015-09-03 NOTE — Telephone Encounter (Signed)
Received fax requesting Vit D Rx sent to mail order---pt had Vit d lab done 07/2015 was 13---pt's insurance did not allow 12 capsules--only #4 per month---I canceled last 4 capsules at local pharmacy---is it okay for pt to complete another 12 weeks Vit D treatment based on lab results? She would only have completed 8 weeks with current Rx---please advise

## 2015-09-03 NOTE — Telephone Encounter (Signed)
Yes, ok to continue Vit D supplement

## 2015-10-12 ENCOUNTER — Ambulatory Visit (INDEPENDENT_AMBULATORY_CARE_PROVIDER_SITE_OTHER): Payer: BLUE CROSS/BLUE SHIELD | Admitting: Psychology

## 2015-10-12 DIAGNOSIS — F411 Generalized anxiety disorder: Secondary | ICD-10-CM

## 2015-10-13 ENCOUNTER — Other Ambulatory Visit: Payer: Self-pay

## 2015-10-13 ENCOUNTER — Encounter: Payer: Self-pay | Admitting: Internal Medicine

## 2015-10-13 MED ORDER — TOPIRAMATE 100 MG PO TABS: 100.0000 mg | ORAL_TABLET | Freq: Every day | ORAL | Status: DC

## 2015-10-26 ENCOUNTER — Ambulatory Visit: Payer: BLUE CROSS/BLUE SHIELD | Admitting: Psychology

## 2015-11-02 ENCOUNTER — Ambulatory Visit: Payer: BLUE CROSS/BLUE SHIELD | Admitting: Psychology

## 2015-11-04 ENCOUNTER — Other Ambulatory Visit: Payer: Self-pay | Admitting: Internal Medicine

## 2015-11-09 ENCOUNTER — Ambulatory Visit: Payer: BLUE CROSS/BLUE SHIELD | Admitting: Psychology

## 2015-11-16 ENCOUNTER — Ambulatory Visit (INDEPENDENT_AMBULATORY_CARE_PROVIDER_SITE_OTHER): Payer: BLUE CROSS/BLUE SHIELD | Admitting: Psychology

## 2015-11-16 DIAGNOSIS — F4323 Adjustment disorder with mixed anxiety and depressed mood: Secondary | ICD-10-CM

## 2015-11-17 DIAGNOSIS — I209 Angina pectoris, unspecified: Secondary | ICD-10-CM | POA: Diagnosis not present

## 2015-11-17 DIAGNOSIS — R0602 Shortness of breath: Secondary | ICD-10-CM | POA: Diagnosis not present

## 2015-11-17 DIAGNOSIS — I201 Angina pectoris with documented spasm: Secondary | ICD-10-CM | POA: Diagnosis not present

## 2015-11-17 DIAGNOSIS — E782 Mixed hyperlipidemia: Secondary | ICD-10-CM | POA: Diagnosis not present

## 2015-11-23 ENCOUNTER — Ambulatory Visit (INDEPENDENT_AMBULATORY_CARE_PROVIDER_SITE_OTHER): Payer: BLUE CROSS/BLUE SHIELD | Admitting: Psychology

## 2015-11-23 DIAGNOSIS — F4323 Adjustment disorder with mixed anxiety and depressed mood: Secondary | ICD-10-CM | POA: Diagnosis not present

## 2015-11-30 ENCOUNTER — Ambulatory Visit: Payer: BLUE CROSS/BLUE SHIELD | Admitting: Psychology

## 2015-12-07 ENCOUNTER — Ambulatory Visit (INDEPENDENT_AMBULATORY_CARE_PROVIDER_SITE_OTHER): Payer: BLUE CROSS/BLUE SHIELD | Admitting: Psychology

## 2015-12-07 DIAGNOSIS — F4323 Adjustment disorder with mixed anxiety and depressed mood: Secondary | ICD-10-CM

## 2015-12-14 ENCOUNTER — Ambulatory Visit (INDEPENDENT_AMBULATORY_CARE_PROVIDER_SITE_OTHER): Payer: BLUE CROSS/BLUE SHIELD | Admitting: Psychology

## 2015-12-14 DIAGNOSIS — F4323 Adjustment disorder with mixed anxiety and depressed mood: Secondary | ICD-10-CM | POA: Diagnosis not present

## 2015-12-21 ENCOUNTER — Ambulatory Visit: Payer: BLUE CROSS/BLUE SHIELD | Admitting: Psychology

## 2015-12-28 ENCOUNTER — Ambulatory Visit (INDEPENDENT_AMBULATORY_CARE_PROVIDER_SITE_OTHER): Payer: BLUE CROSS/BLUE SHIELD | Admitting: Psychology

## 2015-12-28 DIAGNOSIS — F4323 Adjustment disorder with mixed anxiety and depressed mood: Secondary | ICD-10-CM | POA: Diagnosis not present

## 2015-12-31 ENCOUNTER — Ambulatory Visit
Admission: EM | Admit: 2015-12-31 | Discharge: 2015-12-31 | Disposition: A | Discharge status: Home / Self Care | Payer: BLUE CROSS/BLUE SHIELD | Attending: Family Medicine | Admitting: Family Medicine

## 2015-12-31 ENCOUNTER — Ambulatory Visit (INDEPENDENT_AMBULATORY_CARE_PROVIDER_SITE_OTHER): Payer: BLUE CROSS/BLUE SHIELD

## 2015-12-31 ENCOUNTER — Encounter: Payer: Self-pay | Admitting: *Deleted

## 2015-12-31 DIAGNOSIS — Z818 Family history of other mental and behavioral disorders: Secondary | ICD-10-CM | POA: Diagnosis not present

## 2015-12-31 DIAGNOSIS — F329 Major depressive disorder, single episode, unspecified: Secondary | ICD-10-CM | POA: Insufficient documentation

## 2015-12-31 DIAGNOSIS — F419 Anxiety disorder, unspecified: Secondary | ICD-10-CM | POA: Insufficient documentation

## 2015-12-31 DIAGNOSIS — Z7982 Long term (current) use of aspirin: Secondary | ICD-10-CM | POA: Diagnosis not present

## 2015-12-31 DIAGNOSIS — J181 Lobar pneumonia, unspecified organism: Secondary | ICD-10-CM

## 2015-12-31 DIAGNOSIS — Z823 Family history of stroke: Secondary | ICD-10-CM | POA: Insufficient documentation

## 2015-12-31 DIAGNOSIS — Z79899 Other long term (current) drug therapy: Secondary | ICD-10-CM | POA: Insufficient documentation

## 2015-12-31 DIAGNOSIS — F17210 Nicotine dependence, cigarettes, uncomplicated: Secondary | ICD-10-CM | POA: Diagnosis not present

## 2015-12-31 DIAGNOSIS — Z880 Allergy status to penicillin: Secondary | ICD-10-CM | POA: Diagnosis not present

## 2015-12-31 DIAGNOSIS — Z801 Family history of malignant neoplasm of trachea, bronchus and lung: Secondary | ICD-10-CM | POA: Diagnosis not present

## 2015-12-31 DIAGNOSIS — Z882 Allergy status to sulfonamides status: Secondary | ICD-10-CM | POA: Diagnosis not present

## 2015-12-31 DIAGNOSIS — Z885 Allergy status to narcotic agent status: Secondary | ICD-10-CM | POA: Diagnosis not present

## 2015-12-31 DIAGNOSIS — J189 Pneumonia, unspecified organism: Secondary | ICD-10-CM

## 2015-12-31 DIAGNOSIS — R05 Cough: Secondary | ICD-10-CM | POA: Diagnosis not present

## 2015-12-31 DIAGNOSIS — Z833 Family history of diabetes mellitus: Secondary | ICD-10-CM | POA: Insufficient documentation

## 2015-12-31 DIAGNOSIS — J069 Acute upper respiratory infection, unspecified: Secondary | ICD-10-CM | POA: Diagnosis present

## 2015-12-31 DIAGNOSIS — R0602 Shortness of breath: Secondary | ICD-10-CM | POA: Diagnosis not present

## 2015-12-31 MED ORDER — HYDROCOD POLST-CPM POLST ER 10-8 MG/5ML PO SUER: 5.0000 mL | Freq: Two times a day (BID) | ORAL | Status: DC | PRN

## 2015-12-31 MED ORDER — DOXYCYCLINE HYCLATE 100 MG PO TABS: 100.0000 mg | ORAL_TABLET | Freq: Two times a day (BID) | ORAL | Status: DC

## 2015-12-31 MED ORDER — IPRATROPIUM-ALBUTEROL 0.5-2.5 (3) MG/3ML IN SOLN
3.0000 mL | Freq: Once | RESPIRATORY_TRACT | Status: AC
  Administered 2015-12-31: 3 mL via RESPIRATORY_TRACT

## 2015-12-31 NOTE — ED Provider Notes (Signed)
CSN: [id number]     Arrival date & time 12/31/15  4098 History   First MD Initiated Contact with Patient 12/31/15 1023     Chief Complaint  Patient presents with  . URI   (Consider location/radiation/quality/duration/timing/severity/associated sxs/prior Treatment) Patient is a 40 y.o. female presenting with URI. The history is provided by the patient.  URI Presenting symptoms: cough and fever   Severity:  Moderate Onset quality:  Sudden Timing:  Constant Progression:  Worsening Chronicity:  New Relieved by:  Nothing Ineffective treatments:  OTC medications Associated symptoms comment:  Shortness of breath Risk factors: chronic cardiac disease and sick contacts   Risk factors: not elderly, no chronic kidney disease, no chronic respiratory disease, no diabetes mellitus, no immunosuppression, no recent illness and no recent travel     Past Medical History  Diagnosis Date  . Chicken pox   . Depression   . Cardiac microvascular disease (HCC)   . Thyroid mass     right side  . Anxiety    Past Surgical History  Procedure Laterality Date  . Cholecystectomy  2009  . Tubal ligation    . Cesarean section    . Septoplasty    . Thyroid surgery Right     thyroidectomy   Family History  Problem Relation Age of Onset  . Depression Sister   . Anxiety disorder Sister   . Cancer Paternal Uncle     Lung  . Depression Maternal Grandmother   . Heart disease Paternal Grandmother   . Stroke Paternal Grandmother   . Diabetes Paternal Grandmother   . Diabetes Paternal Grandfather    Social History  Substance Use Topics  . Smoking status: Current Every Day Smoker -- 0.50 packs/day    Types: Cigarettes  . Smokeless tobacco: Never Used  . Alcohol Use: 0.0 oz/week    0 Standard drinks or equivalent per week     Comment: occasional   OB History    No data available     Review of Systems  Constitutional: Positive for fever.  Respiratory: Positive for cough.     Allergies   Fentanyl; Mirtazapine; Penicillins; Sulfa antibiotics; and Ms contin   Home Medications   Prior to Admission medications   Medication Sig Start Date End Date Taking? Authorizing Provider  aspirin EC 81 MG tablet Take 1 tablet by mouth daily.   Yes Historical Provider, MD  atorvastatin (LIPITOR) 20 MG tablet Take 1 tablet by mouth daily. 04/06/15  Yes Historical Provider, MD  carvedilol (COREG) 25 MG tablet Take 1 tablet by mouth 2 (two) times daily. 06/24/14 12/31/15 Yes Historical Provider, MD  EPINEPHrine 0.3 mg/0.3 mL IJ SOAJ injection Inject 0.3 mg into the muscle as needed. For Anaphylaxis   Yes Historical Provider, MD  fexofenadine (ALLEGRA) 180 MG tablet Take 1 tablet by mouth daily.   Yes Historical Provider, MD  isosorbide dinitrate (ISORDIL) 20 MG tablet Take 1 tablet by mouth 3 (three) times daily.   Yes Historical Provider, MD  nitroGLYCERIN (NITROSTAT) 0.4 MG SL tablet Place 0.4 mg under the tongue every 5 (five) minutes as needed for chest pain.   Yes Historical Provider, MD  QUEtiapine (SEROQUEL) 50 MG tablet Take 25-50mg  twice a day as needed for anxiety/panic attacks. Take 50mg  nightly for insomnia. 01/05/15  Yes Historical Provider, MD  RANEXA 500 MG 12 hr tablet Take 1 tablet by mouth 2 (two) times daily. 04/06/15  Yes Historical Provider, MD  Selenium Sulfide 2.25 % SHAM Apply 1 application topically  daily as needed. 01/06/15  Yes Lorre Munroeegina W Baity, NP  topiramate (TOPAMAX) 100 MG tablet Take 1 tablet (100 mg total) by mouth daily. 10/13/15  Yes Lorre Munroeegina W Baity, NP  venlafaxine XR (EFFEXOR-XR) 75 MG 24 hr capsule Take 3 capsules by mouth daily. 11/03/14  Yes Historical Provider, MD  Vitamin D, Ergocalciferol, (DRISDOL) 50000 units CAPS capsule Take 1 capsule (50,000 Units total) by mouth every 7 (seven) days. 09/03/15  Yes Lorre Munroeegina W Baity, NP  amLODipine (NORVASC) 10 MG tablet Take 1 tablet by mouth daily. 06/24/14 06/25/15  Historical Provider, MD  chlorpheniramine-HYDROcodone (TUSSIONEX  PENNKINETIC ER) 10-8 MG/5ML SUER Take 5 mLs by mouth every 12 (twelve) hours as needed. 12/31/15   Payton Mccallumrlando Merikay Lesniewski, MD  doxycycline (VIBRA-TABS) 100 MG tablet Take 1 tablet (100 mg total) by mouth 2 (two) times daily. 12/31/15   Payton Mccallumrlando Prue Lingenfelter, MD   Meds Ordered and Administered this Visit   Medications  ipratropium-albuterol (DUONEB) 0.5-2.5 (3) MG/3ML nebulizer solution 3 mL (3 mLs Nebulization Given 12/31/15 0943)    BP 122/66 mmHg  Pulse 89  Temp(Src) 99.1 F (37.3 C) (Oral)  Resp 20  Ht 5\' 7"  (1.702 m)  Wt 235 lb (106.595 kg)  BMI 36.80 kg/m2  SpO2 98%  LMP 12/17/2015 No data found.   Physical Exam  Constitutional: She appears well-developed and well-nourished. No distress.  HENT:  Head: Normocephalic and atraumatic.  Right Ear: Tympanic membrane, external ear and ear canal normal.  Left Ear: Tympanic membrane, external ear and ear canal normal.  Nose: Mucosal edema and rhinorrhea present. No nose lacerations, sinus tenderness, nasal deformity, septal deviation or nasal septal hematoma. No epistaxis.  No foreign bodies.  Mouth/Throat: Uvula is midline, oropharynx is clear and moist and mucous membranes are normal. No oropharyngeal exudate.  Eyes: Conjunctivae and EOM are normal. Pupils are equal, round, and reactive to light. Right eye exhibits no discharge. Left eye exhibits no discharge. No scleral icterus.  Neck: Normal range of motion. Neck supple. No thyromegaly present.  Cardiovascular: Normal rate, regular rhythm and normal heart sounds.   Pulmonary/Chest: Effort normal and breath sounds normal. No respiratory distress. She has no wheezes. She has no rales.  Lymphadenopathy:    She has no cervical adenopathy.  Skin: She is not diaphoretic.  Nursing note and vitals reviewed.   ED Course  Procedures (including critical care time)  Labs Review Labs Reviewed - No data to display  Imaging Review Dg Chest 2 View  12/31/2015  CLINICAL DATA:  Cough and fever EXAM: CHEST   2 VIEW COMPARISON:  None. FINDINGS: Left lower lobe airspace disease. Lingular airspace disease. Findings most consistent with pneumonia. No significant pleural effusion Right lung is clear.  Negative for heart failure. Hiatal hernia with air-fluid level. IMPRESSION: Left lower lobe and lingular infiltrate compatible with pneumonia. Electronically Signed   By: Marlan Palauharles  Clark M.D.   On: 12/31/2015 10:13     Visual Acuity Review  Right Eye Distance:   Left Eye Distance:   Bilateral Distance:    Right Eye Near:   Left Eye Near:    Bilateral Near:         MDM   1. Left lower lobe pneumonia    Discharge Medication List as of 12/31/2015 11:03 AM    START taking these medications   Details  chlorpheniramine-HYDROcodone (TUSSIONEX PENNKINETIC ER) 10-8 MG/5ML SUER Take 5 mLs by mouth every 12 (twelve) hours as needed., Starting 12/31/2015, Until Discontinued, Normal    doxycycline (VIBRA-TABS)  100 MG tablet Take 1 tablet (100 mg total) by mouth 2 (two) times daily., Starting 12/31/2015, Until Discontinued, Normal       1. x-ray results and diagnosis reviewed with patient; patient given duoneb x1 with improvement in symptoms 2. rx as per orders above; reviewed possible side effects, interactions, risks and benefits; (rx for doxycycline due to allergies and current chronic medications interaction precluding use of other antibiotics) 3. Recommend supportive treatment with rest, increased fluids, otc analgesics prn 4. Follow-up prn if symptoms worsen or don't improve    Payton Mccallum, MD 12/31/15 1731

## 2015-12-31 NOTE — ED Notes (Signed)
Patient started having symptoms of cough 2 days ago and difficulty breathing 1 day ago. Additional symptoms of body aches and fever started this am. Son has been diagnosed with bronchitis and is being treated.

## 2016-01-04 ENCOUNTER — Ambulatory Visit: Payer: BLUE CROSS/BLUE SHIELD | Admitting: Psychology

## 2016-01-09 ENCOUNTER — Encounter: Payer: Self-pay | Admitting: Internal Medicine

## 2016-01-11 ENCOUNTER — Ambulatory Visit: Payer: BLUE CROSS/BLUE SHIELD | Admitting: Psychology

## 2016-01-25 ENCOUNTER — Ambulatory Visit: Payer: BLUE CROSS/BLUE SHIELD | Admitting: Psychology

## 2016-02-01 ENCOUNTER — Ambulatory Visit (INDEPENDENT_AMBULATORY_CARE_PROVIDER_SITE_OTHER): Payer: BLUE CROSS/BLUE SHIELD | Admitting: Psychology

## 2016-02-01 DIAGNOSIS — F4323 Adjustment disorder with mixed anxiety and depressed mood: Secondary | ICD-10-CM

## 2016-02-08 ENCOUNTER — Encounter: Payer: Self-pay | Admitting: Internal Medicine

## 2016-02-08 ENCOUNTER — Ambulatory Visit: Payer: BLUE CROSS/BLUE SHIELD | Admitting: Psychology

## 2016-02-22 ENCOUNTER — Ambulatory Visit: Payer: BLUE CROSS/BLUE SHIELD | Admitting: Psychology

## 2016-02-23 ENCOUNTER — Other Ambulatory Visit: Payer: Self-pay

## 2016-02-23 MED ORDER — TOPIRAMATE 100 MG PO TABS: 100.0000 mg | ORAL_TABLET | Freq: Every day | ORAL | Status: DC

## 2016-02-28 DIAGNOSIS — F4010 Social phobia, unspecified: Secondary | ICD-10-CM | POA: Diagnosis not present

## 2016-02-28 DIAGNOSIS — F5102 Adjustment insomnia: Secondary | ICD-10-CM | POA: Diagnosis not present

## 2016-02-28 DIAGNOSIS — F331 Major depressive disorder, recurrent, moderate: Secondary | ICD-10-CM | POA: Diagnosis not present

## 2016-02-28 DIAGNOSIS — F411 Generalized anxiety disorder: Secondary | ICD-10-CM | POA: Diagnosis not present

## 2016-02-29 ENCOUNTER — Ambulatory Visit: Payer: BLUE CROSS/BLUE SHIELD | Admitting: Psychology

## 2016-03-03 ENCOUNTER — Encounter: Payer: Self-pay | Admitting: Internal Medicine

## 2016-03-03 ENCOUNTER — Ambulatory Visit (INDEPENDENT_AMBULATORY_CARE_PROVIDER_SITE_OTHER): Payer: BLUE CROSS/BLUE SHIELD | Admitting: Internal Medicine

## 2016-03-03 VITALS — BP 96/70 | HR 84 | Temp 98.4°F | Ht 66.0 in | Wt 237.0 lb

## 2016-03-03 DIAGNOSIS — E559 Vitamin D deficiency, unspecified: Secondary | ICD-10-CM

## 2016-03-03 DIAGNOSIS — F329 Major depressive disorder, single episode, unspecified: Secondary | ICD-10-CM

## 2016-03-03 DIAGNOSIS — G43819 Other migraine, intractable, without status migrainosus: Secondary | ICD-10-CM

## 2016-03-03 DIAGNOSIS — E538 Deficiency of other specified B group vitamins: Secondary | ICD-10-CM

## 2016-03-03 DIAGNOSIS — Z0001 Encounter for general adult medical examination with abnormal findings: Secondary | ICD-10-CM

## 2016-03-03 DIAGNOSIS — G8929 Other chronic pain: Secondary | ICD-10-CM

## 2016-03-03 DIAGNOSIS — Z9109 Other allergy status, other than to drugs and biological substances: Secondary | ICD-10-CM

## 2016-03-03 DIAGNOSIS — I2589 Other forms of chronic ischemic heart disease: Secondary | ICD-10-CM

## 2016-03-03 DIAGNOSIS — Z111 Encounter for screening for respiratory tuberculosis: Secondary | ICD-10-CM | POA: Diagnosis not present

## 2016-03-03 DIAGNOSIS — E079 Disorder of thyroid, unspecified: Secondary | ICD-10-CM | POA: Diagnosis not present

## 2016-03-03 DIAGNOSIS — R079 Chest pain, unspecified: Secondary | ICD-10-CM | POA: Diagnosis not present

## 2016-03-03 DIAGNOSIS — F419 Anxiety disorder, unspecified: Secondary | ICD-10-CM

## 2016-03-03 DIAGNOSIS — F32A Depression, unspecified: Secondary | ICD-10-CM

## 2016-03-03 DIAGNOSIS — N3946 Mixed incontinence: Secondary | ICD-10-CM

## 2016-03-03 DIAGNOSIS — F418 Other specified anxiety disorders: Secondary | ICD-10-CM | POA: Diagnosis not present

## 2016-03-03 DIAGNOSIS — I209 Angina pectoris, unspecified: Secondary | ICD-10-CM

## 2016-03-03 DIAGNOSIS — Z91048 Other nonmedicinal substance allergy status: Secondary | ICD-10-CM

## 2016-03-03 LAB — COMPREHENSIVE METABOLIC PANEL
ALK PHOS: 70 U/L (ref 39–117)
ALT: 11 U/L (ref 0–35)
AST: 15 U/L (ref 0–37)
Albumin: 4 g/dL (ref 3.5–5.2)
BUN: 12 mg/dL (ref 6–23)
CHLORIDE: 110 meq/L (ref 96–112)
CO2: 24 mEq/L (ref 19–32)
Calcium: 8.9 mg/dL (ref 8.4–10.5)
Creatinine, Ser: 1.25 mg/dL — ABNORMAL HIGH (ref 0.40–1.20)
GFR: 50.51 mL/min — ABNORMAL LOW (ref >60.00)
GLUCOSE: 82 mg/dL (ref 70–99)
POTASSIUM: 4 meq/L (ref 3.5–5.1)
SODIUM: 139 meq/L (ref 135–145)
TOTAL PROTEIN: 7.1 g/dL (ref 6.0–8.3)
Total Bilirubin: 0.3 mg/dL (ref 0.2–1.2)

## 2016-03-03 LAB — LIPID PANEL
Cholesterol: 143 mg/dL (ref 0–200)
HDL: 55.2 mg/dL (ref >39.00)
LDL CALC: 75 mg/dL (ref 0–99)
NONHDL: 87.39
Total CHOL/HDL Ratio: 3
Triglycerides: 64 mg/dL (ref 0.0–149.0)
VLDL: 12.8 mg/dL (ref 0.0–40.0)

## 2016-03-03 LAB — TSH: TSH: 1.48 u[IU]/mL (ref 0.35–4.50)

## 2016-03-03 LAB — VITAMIN B12: VITAMIN B 12: 172 pg/mL — AB (ref 211–911)

## 2016-03-03 LAB — CBC
HEMATOCRIT: 38.1 % (ref 36.0–46.0)
Hemoglobin: 12.8 g/dL (ref 12.0–15.0)
MCHC: 33.6 g/dL (ref 30.0–36.0)
MCV: 97.1 fl (ref 78.0–100.0)
Platelets: 315 10*3/uL (ref 150.0–400.0)
RBC: 3.92 Mil/uL (ref 3.87–5.11)
RDW: 14.3 % (ref 11.5–15.5)
WBC: 5.9 10*3/uL (ref 4.0–10.5)

## 2016-03-03 LAB — VITAMIN D 25 HYDROXY (VIT D DEFICIENCY, FRACTURES): VITD: 31.5 ng/mL (ref 30.00–100.00)

## 2016-03-03 MED ORDER — EPINEPHRINE 0.3 MG/0.3ML IJ SOAJ: 0.3000 mg | INTRAMUSCULAR | Status: DC | PRN

## 2016-03-03 NOTE — Assessment & Plan Note (Signed)
Continue daily Allegra Will monitor

## 2016-03-03 NOTE — Progress Notes (Signed)
Pre visit review using our clinic review tool, if applicable. No additional management support is needed unless otherwise documented below in the visit note. 

## 2016-03-03 NOTE — Progress Notes (Signed)
Subjective:    Patient ID: Dana Miller, female    DOB: 1976/05/01, 40 y.o.   MRN: [id number]  HPI  Pt presents to the clinic today for her annual exam. She is also due for follow up of chronic conditions.  Flu: 05/2014 Tetanus: 12/2014 Pap Smear: 12/2014 Dentist: biannually  Diet: She does eat meats. She consumes fruits and veggies daily. She tries to avoid sweets and fried foods. She has cut out sodas. She drinks mostly water and juice. Exercise: She is slowly getting into exercise, squats etc  Anxiety and Depression: She follows with Dr. Lucianne Muss at [facility] She takes Effexor daily. She sees her psychiatrist once per month. She does plan on seeing a counselor once a week. She is now taking Seroquel at night for sleep.  Microvascular heart disease: She follows with Dr. Alden Hipp at [facility]. Taking Norvasc, Coreg, Lipitor and ASA daily. She is taking the Isosorbide daily. She uses the Nitro 1-2 times a day when needed. She has chest pain on a daily basis but has "learned to live with it".  Chronic chest pain: Secondary to the microvascular disease. She follows with Dr. Azzie Glatter at [facility]. She reports the Isosorbide, Ranexa and Nitro are not helping all that much. Her pain management doctor is considering putting her on narcotics but insist that she be weaned off the Valium first.  Thyroid mass: She has had a partial thyroidectomy in 2011. She reports this was a benign mass.  Allergies: They occur all year long. She is taking Allegra daily with good relief.  Migraines: These are intermittent. She takes Topomax daily. She is not sure what triggers it. It is located on the right side of her head. She does have sensitivity to light and sound. She also has nausea and vomiting.    Review of Systems      Past Medical History  Diagnosis Date  . Chicken pox   . Depression   . Cardiac microvascular disease (HCC)   . Thyroid mass     right side  . Anxiety     Current Outpatient Prescriptions    Medication Sig Dispense Refill  . amLODipine (NORVASC) 10 MG tablet Take 1 tablet by mouth daily.    Marland Kitchen aspirin EC 81 MG tablet Take 1 tablet by mouth daily.    Marland Kitchen atorvastatin (LIPITOR) 20 MG tablet Take 1 tablet by mouth daily.  11  . carvedilol (COREG) 25 MG tablet Take 1 tablet by mouth 2 (two) times daily.    . chlorpheniramine-HYDROcodone (TUSSIONEX PENNKINETIC ER) 10-8 MG/5ML SUER Take 5 mLs by mouth every 12 (twelve) hours as needed. 140 mL 0  . doxycycline (VIBRA-TABS) 100 MG tablet Take 1 tablet (100 mg total) by mouth 2 (two) times daily. 20 tablet 0  . EPINEPHrine 0.3 mg/0.3 mL IJ SOAJ injection Inject 0.3 mg into the muscle as needed. For Anaphylaxis    . fexofenadine (ALLEGRA) 180 MG tablet Take 1 tablet by mouth daily.    . isosorbide dinitrate (ISORDIL) 20 MG tablet Take 1 tablet by mouth 3 (three) times daily.    . nitroGLYCERIN (NITROSTAT) 0.4 MG SL tablet Place 0.4 mg under the tongue every 5 (five) minutes as needed for chest pain.    Marland Kitchen QUEtiapine (SEROQUEL) 50 MG tablet Take 25-50mg  twice a day as needed for anxiety/panic attacks. Take 50mg  nightly for insomnia.    Marland Kitchen RANEXA 500 MG 12 hr tablet Take 1 tablet by mouth 2 (two) times daily.  11  . Selenium  Sulfide 2.25 % SHAM Apply 1 application topically daily as needed. 1 Bottle 3  . topiramate (TOPAMAX) 100 MG tablet Take 1 tablet (100 mg total) by mouth daily. 90 tablet 0  . venlafaxine XR (EFFEXOR-XR) 75 MG 24 hr capsule Take 3 capsules by mouth daily.    . Vitamin D, Ergocalciferol, (DRISDOL) 50000 units CAPS capsule Take 1 capsule (50,000 Units total) by mouth every 7 (seven) days. 12 capsule 0   No current facility-administered medications for this visit.    Allergies  Allergen Reactions  . Fentanyl Swelling  . Mirtazapine Swelling  . Penicillins Anaphylaxis  . Sulfa Antibiotics Hives  . Ms Contin  [Morphine] Swelling    Family History  Problem Relation Age of Onset  . Depression Sister   . Anxiety disorder  Sister   . Cancer Paternal Uncle     Lung  . Depression Maternal Grandmother   . Heart disease Paternal Grandmother   . Stroke Paternal Grandmother   . Diabetes Paternal Grandmother   . Diabetes Paternal Grandfather     Social History   Social History  . Marital Status: Married    Spouse Name: N/A  . Number of Children: N/A  . Years of Education: N/A   Occupational History  . Not on file.   Social History Main Topics  . Smoking status: Current Every Day Smoker -- 0.50 packs/day    Types: Cigarettes  . Smokeless tobacco: Never Used  . Alcohol Use: 0.0 oz/week    0 Standard drinks or equivalent per week     Comment: occasional  . Drug Use: No  . Sexual Activity: Yes   Other Topics Concern  . Not on file   Social History Narrative     Constitutional: Pt reports fatigue. Denies fever, malaise, headache or abrupt weight changes.  HEENT: Denies eye pain, eye redness, ear pain, ringing in the ears, wax buildup, runny nose, nasal congestion, bloody nose, or sore throat. Respiratory: Denies difficulty breathing, shortness of breath, cough or sputum production.   Cardiovascular: Pt reports chronic chest pain. Denies chest tightness, palpitations or swelling in the hands or feet.  Gastrointestinal: Denies abdominal pain, bloating, constipation, diarrhea or blood in the stool.  GU: Pt reports urge and stress incontinence. Denies frequency, pain with urination, burning sensation, blood in urine, odor or discharge. Musculoskeletal: Denies decrease in range of motion, difficulty with gait, muscle pain or joint pain and swelling.  Skin: Denies redness, rashes, lesions or ulcercations.  Neurological: Denies dizziness, difficulty with memory, difficulty with speech or problems with balance and coordination.  Psych: Pt reports anxiety and depression. Denies SI/HI.  No other specific complaints in a complete review of systems (except as listed in HPI above).  Objective:   Physical  Exam BP 96/70 mmHg  Pulse 84  Temp(Src) 98.4 F (36.9 C) (Oral)  Ht  (1.676 m)  Wt 237 lb (107.502 kg)  BMI 38.27 kg/m2  SpO2 97%  LMP 02/27/2016  Wt Readings from Last 3 Encounters:  12/31/15 235 lb (106.595 kg)  07/20/15 236 lb (107.049 kg)  06/06/15 235 lb (106.595 kg)    General: Appears her stated age, obese in NAD. Skin: Warm, dry and intact. No rashes or ulcerations noted. HEENT: Head: normal shape and size; Eyes: sclera white, no icterus, conjunctiva pink, PERRLA and EOMs intact; Ears: Tm's gray and intact, normal light reflex; Throat/Mouth: Teeth present, mucosa pink and moist, no exudate, lesions or ulcerations noted. Small growth noted on right tonsil. Neck:  Neck supple, trachea midline. No masses, lumps or thyromegaly present. Surgical scar noted on anterior neck. Cardiovascular: Normal rate and rhythm. S1,S2 noted.  No murmur, rubs or gallops noted. No JVD or BLE edema.  Pulmonary/Chest: Normal effort and positive vesicular breath sounds. No respiratory distress. No wheezes, rales or ronchi noted.  Abdomen: Soft and nontender. Normal bowel sounds. No distention or masses noted. Liver, spleen and kidneys non palpable. Musculoskeletal: Strength 5/5 BUE/BLE. No signs of joint swelling. No difficulty with gait.  Neurological: Alert and oriented. Cranial nerves II-XII grossly intact. Coordination normal.  Psychiatric: Mood and affect mildly flat. Behavior is normal. Judgment and thought content normal.     BMET    Component Value Date/Time   NA 141 07/20/2015 0926   K 3.9 07/20/2015 0926   CL 111 07/20/2015 0926   CO2 23 07/20/2015 0926   GLUCOSE 89 07/20/2015 0926   BUN 9 07/20/2015 0926   CREATININE 1.12 07/20/2015 0926   CALCIUM 8.7 07/20/2015 0926    Lipid Panel     Component Value Date/Time   CHOL 163 01/04/2015 1122   TRIG 175.0* 01/04/2015 1122   HDL 57.70 01/04/2015 1122   CHOLHDL 3 01/04/2015 1122   VLDL 35.0 01/04/2015 1122   LDLCALC 70  01/04/2015 1122    CBC    Component Value Date/Time   WBC 8.3 07/20/2015 0926   RBC 3.77* 07/20/2015 0926   HGB 12.4 07/20/2015 0926   HCT 36.8 07/20/2015 0926   PLT 296.0 07/20/2015 0926   MCV 97.6 07/20/2015 0926   MCHC 33.6 07/20/2015 0926   RDW 13.6 07/20/2015 0926   LYMPHSABS 1.4 07/20/2015 0926   MONOABS 0.5 07/20/2015 0926   EOSABS 0.3 07/20/2015 0926   BASOSABS 0.0 07/20/2015 0926    Hgb A1C Lab Results  Component Value Date   HGBA1C 5.3 01/04/2015         Assessment & Plan:   Preventative Health Maintenance:  CBC, CMET, Lipid, TSH and Quantiferon TB Gold assay ordered Encouraged her to get a flu shot in the fall Tetanus UTD Pap Smear UTD Encouraged her to consume a balanced diet and start an exercise regimen Advised her to continue to see a dentist annually Discussed mammogram and vision screening starting at age 40  B12 and Vit D deficiency:  Will check Vit D and B12 today  Mixed Incontinence:  Discussed kegel exercises, treatment with OAB medication or referral to urogynecology for evaluation of need for bladder tack  RTC in 1 year or sooner if needed

## 2016-03-03 NOTE — Assessment & Plan Note (Signed)
S/p partial thyroidectomy Will monitor

## 2016-03-03 NOTE — Assessment & Plan Note (Signed)
Stable on Effexor Now on Seroquel for sleep She will continue to follow with Dr. Lucianne MussKumar

## 2016-03-03 NOTE — Assessment & Plan Note (Signed)
Lipid panel and CMET today She will continue Coreg, Norvasc, Zocor, ASA, Isosorbide and Nitro She will continue to follow with Dr. Alden Hippaubert

## 2016-03-03 NOTE — Patient Instructions (Signed)
Health Maintenance, Female Adopting a healthy lifestyle and getting preventive care can go a long way to promote health and wellness. Talk with your health care provider about what schedule of regular examinations is right for you. This is a good chance for you to check in with your provider about disease prevention and staying healthy. In between checkups, there are plenty of things you can do on your own. Experts have done a lot of research about which lifestyle changes and preventive measures are most likely to keep you healthy. Ask your health care provider for more information. WEIGHT AND DIET  Eat a healthy diet  Be sure to include plenty of vegetables, fruits, low-fat dairy products, and lean protein.  Do not eat a lot of foods high in solid fats, added sugars, or salt.  Get regular exercise. This is one of the most important things you can do for your health.  Most adults should exercise for at least 150 minutes each week. The exercise should increase your heart rate and make you sweat (moderate-intensity exercise).  Most adults should also do strengthening exercises at least twice a week. This is in addition to the moderate-intensity exercise.  Maintain a healthy weight  Body mass index (BMI) is a measurement that can be used to identify possible weight problems. It estimates body fat based on height and weight. Your health care provider can help determine your BMI and help you achieve or maintain a healthy weight.  For females 20 years of age and older:   A BMI below 18.5 is considered underweight.  A BMI of 18.5 to 24.9 is normal.  A BMI of 25 to 29.9 is considered overweight.  A BMI of 30 and above is considered obese.  Watch levels of cholesterol and blood lipids  You should start having your blood tested for lipids and cholesterol at 40 years of age, then have this test every 5 years.  You may need to have your cholesterol levels checked more often if:  Your lipid  or cholesterol levels are high.  You are older than 40 years of age.  You are at high risk for heart disease.  CANCER SCREENING   Lung Cancer  Lung cancer screening is recommended for adults 55-80 years old who are at high risk for lung cancer because of a history of smoking.  A yearly low-dose CT scan of the lungs is recommended for people who:  Currently smoke.  Have quit within the past 15 years.  Have at least a 30-pack-year history of smoking. A pack year is smoking an average of one pack of cigarettes a day for 1 year.  Yearly screening should continue until it has been 15 years since you quit.  Yearly screening should stop if you develop a health problem that would prevent you from having lung cancer treatment.  Breast Cancer  Practice breast self-awareness. This means understanding how your breasts normally appear and feel.  It also means doing regular breast self-exams. Let your health care provider know about any changes, no matter how small.  If you are in your 20s or 30s, you should have a clinical breast exam (CBE) by a health care provider every 1-3 years as part of a regular health exam.  If you are 40 or older, have a CBE every year. Also consider having a breast X-ray (mammogram) every year.  If you have a family history of breast cancer, talk to your health care provider about genetic screening.  If you   are at high risk for breast cancer, talk to your health care provider about having an MRI and a mammogram every year.  Breast cancer gene (BRCA) assessment is recommended for women who have family members with BRCA-related cancers. BRCA-related cancers include:  Breast.  Ovarian.  Tubal.  Peritoneal cancers.  Results of the assessment will determine the need for genetic counseling and BRCA1 and BRCA2 testing. Cervical Cancer Your health care provider may recommend that you be screened regularly for cancer of the pelvic organs (ovaries, uterus, and  vagina). This screening involves a pelvic examination, including checking for microscopic changes to the surface of your cervix (Pap test). You may be encouraged to have this screening done every 3 years, beginning at age 21.  For women ages 30-65, health care providers may recommend pelvic exams and Pap testing every 3 years, or they may recommend the Pap and pelvic exam, combined with testing for human papilloma virus (HPV), every 5 years. Some types of HPV increase your risk of cervical cancer. Testing for HPV may also be done on women of any age with unclear Pap test results.  Other health care providers may not recommend any screening for nonpregnant women who are considered low risk for pelvic cancer and who do not have symptoms. Ask your health care provider if a screening pelvic exam is right for you.  If you have had past treatment for cervical cancer or a condition that could lead to cancer, you need Pap tests and screening for cancer for at least 20 years after your treatment. If Pap tests have been discontinued, your risk factors (such as having a new sexual partner) need to be reassessed to determine if screening should resume. Some women have medical problems that increase the chance of getting cervical cancer. In these cases, your health care provider may recommend more frequent screening and Pap tests. Colorectal Cancer  This type of cancer can be detected and often prevented.  Routine colorectal cancer screening usually begins at 40 years of age and continues through 40 years of age.  Your health care provider may recommend screening at an earlier age if you have risk factors for colon cancer.  Your health care provider may also recommend using home test kits to check for hidden blood in the stool.  A small camera at the end of a tube can be used to examine your colon directly (sigmoidoscopy or colonoscopy). This is done to check for the earliest forms of colorectal  cancer.  Routine screening usually begins at age 50.  Direct examination of the colon should be repeated every 5-10 years through 40 years of age. However, you may need to be screened more often if early forms of precancerous polyps or small growths are found. Skin Cancer  Check your skin from head to toe regularly.  Tell your health care provider about any new moles or changes in moles, especially if there is a change in a mole's shape or color.  Also tell your health care provider if you have a mole that is larger than the size of a pencil eraser.  Always use sunscreen. Apply sunscreen liberally and repeatedly throughout the day.  Protect yourself by wearing long sleeves, pants, a wide-brimmed hat, and sunglasses whenever you are outside. HEART DISEASE, DIABETES, AND HIGH BLOOD PRESSURE   High blood pressure causes heart disease and increases the risk of stroke. High blood pressure is more likely to develop in:  People who have blood pressure in the high end   of the normal range (130-139/85-89 mm Hg).  People who are overweight or obese.  People who are African American.  If you are 38-23 years of age, have your blood pressure checked every 3-5 years. If you are 61 years of age or older, have your blood pressure checked every year. You should have your blood pressure measured twice--once when you are at a hospital or clinic, and once when you are not at a hospital or clinic. Record the average of the two measurements. To check your blood pressure when you are not at a hospital or clinic, you can use:  An automated blood pressure machine at a pharmacy.  A home blood pressure monitor.  If you are between 45 years and 39 years old, ask your health care provider if you should take aspirin to prevent strokes.  Have regular diabetes screenings. This involves taking a blood sample to check your fasting blood sugar level.  If you are at a normal weight and have a low risk for diabetes,  have this test once every three years after 40 years of age.  If you are overweight and have a high risk for diabetes, consider being tested at a younger age or more often. PREVENTING INFECTION  Hepatitis B  If you have a higher risk for hepatitis B, you should be screened for this virus. You are considered at high risk for hepatitis B if:  You were born in a country where hepatitis B is common. Ask your health care provider which countries are considered high risk.  Your parents were born in a high-risk country, and you have not been immunized against hepatitis B (hepatitis B vaccine).  You have HIV or AIDS.  You use needles to inject street drugs.  You live with someone who has hepatitis B.  You have had sex with someone who has hepatitis B.  You get hemodialysis treatment.  You take certain medicines for conditions, including cancer, organ transplantation, and autoimmune conditions. Hepatitis C  Blood testing is recommended for:  Everyone born from [birth year] through 1965.  Anyone with known risk factors for hepatitis C. Sexually transmitted infections (STIs)  You should be screened for sexually transmitted infections (STIs) including gonorrhea and chlamydia if:  You are sexually active and are younger than 40 years of age.  You are older than 40 years of age and your health care provider tells you that you are at risk for this type of infection.  Your sexual activity has changed since you were last screened and you are at an increased risk for chlamydia or gonorrhea. Ask your health care provider if you are at risk.  If you do not have HIV, but are at risk, it may be recommended that you take a prescription medicine daily to prevent HIV infection. This is called pre-exposure prophylaxis (PrEP). You are considered at risk if:  You are sexually active and do not regularly use condoms or know the HIV status of your partner(s).  You take drugs by injection.  You are sexually  active with a partner who has HIV. Talk with your health care provider about whether you are at high risk of being infected with HIV. If you choose to begin PrEP, you should first be tested for HIV. You should then be tested every 3 months for as long as you are taking PrEP.  PREGNANCY   If you are premenopausal and you may become pregnant, ask your health care provider about preconception counseling.  If you may  become pregnant, take 400 to 800 micrograms (mcg) of folic acid every day.  If you want to prevent pregnancy, talk to your health care provider about birth control (contraception). OSTEOPOROSIS AND MENOPAUSE   Osteoporosis is a disease in which the bones lose minerals and strength with aging. This can result in serious bone fractures. Your risk for osteoporosis can be identified using a bone density scan.  If you are 61 years of age or older, or if you are at risk for osteoporosis and fractures, ask your health care provider if you should be screened.  Ask your health care provider whether you should take a calcium or vitamin D supplement to lower your risk for osteoporosis.  Menopause may have certain physical symptoms and risks.  Hormone replacement therapy may reduce some of these symptoms and risks. Talk to your health care provider about whether hormone replacement therapy is right for you.  HOME CARE INSTRUCTIONS   Schedule regular health, dental, and eye exams.  Stay current with your immunizations.   Do not use any tobacco products including cigarettes, chewing tobacco, or electronic cigarettes.  If you are pregnant, do not drink alcohol.  If you are breastfeeding, limit how much and how often you drink alcohol.  Limit alcohol intake to no more than 1 drink per day for nonpregnant women. One drink equals 12 ounces of beer, 5 ounces of wine, or 1 ounces of hard liquor.  Do not use street drugs.  Do not share needles.  Ask your health care provider for help if  you need support or information about quitting drugs.  Tell your health care provider if you often feel depressed.  Tell your health care provider if you have ever been abused or do not feel safe at home.   This information is not intended to replace advice given to you by your health care provider. Make sure you discuss any questions you have with your health care provider.   Document Released: 02/13/2011 Document Revised: 08/21/2014 Document Reviewed: 07/02/2013 Elsevier Interactive Patient Education [employer].

## 2016-03-03 NOTE — Assessment & Plan Note (Signed)
Controlled on Topomax Will monitor

## 2016-03-03 NOTE — Assessment & Plan Note (Signed)
Secondary to microvascular disease She is following with Dr. Azzie GlatterFras

## 2016-03-05 LAB — QUANTIFERON TB GOLD ASSAY (BLOOD)
INTERFERON GAMMA RELEASE ASSAY: NEGATIVE
Mitogen-Nil: >10 IU/mL
Quantiferon Nil Value: 0.01 IU/mL
Quantiferon Tb Ag Minus Nil Value: 0 IU/mL

## 2016-03-07 ENCOUNTER — Encounter: Payer: Self-pay | Admitting: Internal Medicine

## 2016-03-07 ENCOUNTER — Ambulatory Visit (INDEPENDENT_AMBULATORY_CARE_PROVIDER_SITE_OTHER): Payer: BLUE CROSS/BLUE SHIELD | Admitting: Psychology

## 2016-03-07 DIAGNOSIS — F4323 Adjustment disorder with mixed anxiety and depressed mood: Secondary | ICD-10-CM

## 2016-03-10 DIAGNOSIS — I209 Angina pectoris, unspecified: Secondary | ICD-10-CM | POA: Diagnosis not present

## 2016-03-10 DIAGNOSIS — I201 Angina pectoris with documented spasm: Secondary | ICD-10-CM | POA: Diagnosis not present

## 2016-03-10 DIAGNOSIS — F17200 Nicotine dependence, unspecified, uncomplicated: Secondary | ICD-10-CM | POA: Diagnosis not present

## 2016-03-10 NOTE — Telephone Encounter (Signed)
Form placed in the front office for pick up---mychart msg sent to pt letting her know it is ready

## 2016-03-14 ENCOUNTER — Ambulatory Visit: Payer: BLUE CROSS/BLUE SHIELD | Admitting: Psychology

## 2016-03-16 ENCOUNTER — Encounter: Payer: Self-pay | Admitting: Internal Medicine

## 2016-03-21 ENCOUNTER — Ambulatory Visit: Payer: BLUE CROSS/BLUE SHIELD | Admitting: Psychology

## 2016-03-28 ENCOUNTER — Ambulatory Visit: Payer: BLUE CROSS/BLUE SHIELD | Admitting: Psychology

## 2016-04-03 ENCOUNTER — Ambulatory Visit
Admission: EM | Admit: 2016-04-03 | Discharge: 2016-04-03 | Disposition: A | Discharge status: Home / Self Care | Payer: BLUE CROSS/BLUE SHIELD | Attending: Family Medicine | Admitting: Family Medicine

## 2016-04-03 ENCOUNTER — Ambulatory Visit (INDEPENDENT_AMBULATORY_CARE_PROVIDER_SITE_OTHER): Payer: BLUE CROSS/BLUE SHIELD

## 2016-04-03 DIAGNOSIS — S3992XA Unspecified injury of lower back, initial encounter: Secondary | ICD-10-CM | POA: Diagnosis not present

## 2016-04-03 DIAGNOSIS — M25552 Pain in left hip: Secondary | ICD-10-CM

## 2016-04-03 DIAGNOSIS — S39012A Strain of muscle, fascia and tendon of lower back, initial encounter: Secondary | ICD-10-CM

## 2016-04-03 DIAGNOSIS — M545 Low back pain, unspecified: Secondary | ICD-10-CM

## 2016-04-03 DIAGNOSIS — W19XXXA Unspecified fall, initial encounter: Secondary | ICD-10-CM

## 2016-04-03 DIAGNOSIS — S79912A Unspecified injury of left hip, initial encounter: Secondary | ICD-10-CM | POA: Diagnosis not present

## 2016-04-03 MED ORDER — CYCLOBENZAPRINE HCL 10 MG PO TABS: 10.0000 mg | ORAL_TABLET | Freq: Every day | ORAL | 0 refills | Status: DC

## 2016-04-03 MED ORDER — PREDNISONE 10 MG PO TABS: ORAL_TABLET | ORAL | 0 refills | Status: DC

## 2016-04-03 MED ORDER — HYDROCODONE-ACETAMINOPHEN 5-325 MG PO TABS: ORAL_TABLET | ORAL | 0 refills | Status: DC

## 2016-04-03 NOTE — ED Provider Notes (Signed)
[facility]    CSN: [id number] Arrival date & time: 04/03/16  4098  First Provider Contact:  None       History   Chief Complaint Chief Complaint  Patient presents with  . Back Pain    HPI Dana Miller is a 40 y.o. female.   The history is provided by the patient.  Back Pain  Location:  Lumbar spine Quality:  Aching Radiates to:  L thigh Pain severity:  Moderate Pain is:  Same all the time Onset quality:  Gradual Duration:  3 weeks Timing:  Constant Progression:  Worsening Chronicity:  New Context: falling   Relieved by:  Nothing Ineffective treatments:  OTC medications Associated symptoms: no bladder incontinence, no bowel incontinence, no fever, no numbness, no paresthesias, no perianal numbness, no tingling and no weakness     Past Medical History:  Diagnosis Date  . Anxiety   . Cardiac microvascular disease (HCC)   . Chicken pox   . Depression   . Thyroid mass    right side    Patient Active Problem List   Diagnosis Date Noted  . Anxiety and depression 12/29/2014  . Cardiac microvascular disease (HCC) 12/29/2014  . Chronic chest pain 12/29/2014  . Thyroid mass 12/29/2014  . Environmental allergies 12/29/2014  . Migraines 12/29/2014    Past Surgical History:  Procedure Laterality Date  . CESAREAN SECTION    . CHOLECYSTECTOMY  2009  . SEPTOPLASTY    . THYROID SURGERY Right    thyroidectomy  . TUBAL LIGATION      OB History    No data available       Home Medications    Prior to Admission medications   Medication Sig Start Date End Date Taking? Authorizing Provider  aspirin EC 81 MG tablet Take 1 tablet by mouth daily.   Yes Historical Provider, MD  atorvastatin (LIPITOR) 20 MG tablet Take 1 tablet by mouth daily. 04/06/15  Yes Historical Provider, MD  EPINEPHrine 0.3 mg/0.3 mL IJ SOAJ injection Inject 0.3 mLs (0.3 mg total) into the muscle as needed. For Anaphylaxis 03/03/16  Yes Lorre Munroe, NP  fexofenadine (ALLEGRA)  180 MG tablet Take 1 tablet by mouth daily.   Yes Historical Provider, MD  isosorbide dinitrate (ISORDIL) 20 MG tablet Take 1 tablet by mouth 3 (three) times daily.   Yes Historical Provider, MD  nitroGLYCERIN (NITROSTAT) 0.4 MG SL tablet Place 0.4 mg under the tongue every 5 (five) minutes as needed for chest pain.   Yes Historical Provider, MD  QUEtiapine (SEROQUEL) 50 MG tablet Take 25-50mg  twice a day as needed for anxiety/panic attacks. Take 50mg  nightly for insomnia. 01/05/15  Yes Historical Provider, MD  RANEXA 500 MG 12 hr tablet Take 1 tablet by mouth 2 (two) times daily. 04/06/15  Yes Historical Provider, MD  Selenium Sulfide 2.25 % SHAM Apply 1 application topically daily as needed. 01/06/15  Yes Lorre Munroe, NP  topiramate (TOPAMAX) 100 MG tablet Take 1 tablet (100 mg total) by mouth daily. 02/23/16  Yes Lorre Munroe, NP  venlafaxine XR (EFFEXOR-XR) 75 MG 24 hr capsule Take 3 capsules by mouth daily. 11/03/14  Yes Historical Provider, MD  Vitamin D, Ergocalciferol, (DRISDOL) 50000 units CAPS capsule Take 1 capsule (50,000 Units total) by mouth every 7 (seven) days. 09/03/15  Yes Lorre Munroe, NP  amLODipine (NORVASC) 10 MG tablet Take 1 tablet by mouth daily. 06/24/14 06/25/15  Historical Provider, MD  carvedilol (COREG) 25 MG tablet  Take 1 tablet by mouth 2 (two) times daily. 06/24/14 12/31/15  Historical Provider, MD  cyclobenzaprine (FLEXERIL) 10 MG tablet Take 1 tablet (10 mg total) by mouth at bedtime. 04/03/16   Payton Mccallumrlando Finnley Lewis, MD  HYDROcodone-acetaminophen (NORCO/VICODIN) 5-325 MG tablet 1-2 tabs po bid prn 04/03/16   Payton Mccallumrlando Forrest Jaroszewski, MD  predniSONE (DELTASONE) 10 MG tablet 1 tab po qd for 5 days 04/03/16   Payton Mccallumrlando Demetrice Combes, MD    Family History Family History  Problem Relation Age of Onset  . Depression Sister   . Anxiety disorder Sister   . Cancer Paternal Uncle     Lung  . Depression Maternal Grandmother   . Heart disease Paternal Grandmother   . Stroke Paternal Grandmother   .  Diabetes Paternal Grandmother   . Diabetes Paternal Grandfather     Social History Social History  Substance Use Topics  . Smoking status: Current Every Day Smoker    Packs/day: 0.50    Types: Cigarettes  . Smokeless tobacco: Never Used  . Alcohol use 0.0 oz/week     Comment: occasional     Allergies   Fentanyl; Mirtazapine; Penicillins; Sulfa antibiotics; and Ms contin  [morphine]   Review of Systems Review of Systems  Constitutional: Negative for fever.  Gastrointestinal: Negative for bowel incontinence.  Genitourinary: Negative for bladder incontinence.  Musculoskeletal: Positive for back pain.  Neurological: Negative for tingling, weakness, numbness and paresthesias.     Physical Exam Triage Vital Signs ED Triage Vitals  Enc Vitals Group     BP 04/03/16 1012 114/69     Pulse Rate 04/03/16 1012 77     Resp 04/03/16 1012 18     Temp 04/03/16 1012 98.1 F (36.7 C)     Temp Source 04/03/16 1012 Oral     SpO2 04/03/16 1012 97 %     Weight 04/03/16 1010 230 lb (104.3 kg)     Height 04/03/16 1010 5\' 6"  (1.676 m)     Head Circumference --      Peak Flow --      Pain Score 04/03/16 1012 8     Pain Loc --      Pain Edu? --      Excl. in GC? --    No data found.   Updated Vital Signs BP 114/69 (BP Location: Left Arm)   Pulse 77   Temp 98.1 F (36.7 C) (Oral)   Resp 18   Ht 5\' 6"  (1.676 m)   Wt 230 lb (104.3 kg)   LMP 03/24/2016 (Exact Date) Comment: denies preg, signed waiver,tubaligation 2009  SpO2 97%   BMI 37.12 kg/m   Visual Acuity Right Eye Distance:   Left Eye Distance:   Bilateral Distance:    Right Eye Near:   Left Eye Near:    Bilateral Near:     Physical Exam  Constitutional: She appears well-developed and well-nourished. No distress.  Musculoskeletal: She exhibits tenderness. She exhibits no edema.       Lumbar back: She exhibits tenderness (over the lumbar paraspinous muscles) and spasm. She exhibits normal range of motion, no bony  tenderness, no swelling, no edema, no deformity, no laceration, no pain and normal pulse.  Neurological: She is alert. She has normal reflexes. She displays normal reflexes. She exhibits normal muscle tone.  Skin: Skin is warm and dry. No rash noted. She is not diaphoretic. No erythema.  Nursing note and vitals reviewed.    UC Treatments / Results  Labs (all labs ordered  are listed, but only abnormal results are displayed) Labs Reviewed - No data to display  EKG  EKG Interpretation None       Radiology Dg Lumbar Spine Complete  Result Date: 04/03/2016 CLINICAL DATA:  Fall, pain EXAM: LUMBAR SPINE - COMPLETE 4+ VIEW COMPARISON:  None. FINDINGS: Prior cholecystectomy. Normal alignment. Mild degenerative changes in the facet joints in the lower lumbar spine. No fracture. Disc spaces are maintained. SI joints are symmetric and unremarkable. IMPRESSION: Degenerative facet disease at L5-S1.  No acute findings. Electronically Signed   By: Charlett NoseKevin  Dover M.D.   On: 04/03/2016 11:49   Dg Hip Unilat With Pelvis 2-3 Views Left  Result Date: 04/03/2016 CLINICAL DATA:  Fall 3 weeks ago.  Left hip pain. EXAM: DG HIP (WITH OR WITHOUT PELVIS) 2-3V LEFT COMPARISON:  None FINDINGS: No acute bony abnormality. Specifically, no fracture, subluxation, or dislocation. Soft tissues are intact. SI joints and hip joints are symmetric and unremarkable. IMPRESSION: Negative. Electronically Signed   By: Charlett NoseKevin  Dover M.D.   On: 04/03/2016 11:50    Procedures Procedures (including critical care time)  Medications Ordered in UC Medications - No data to display   Initial Impression / Assessment and Plan / UC Course  I have reviewed the triage vital signs and the nursing notes.  Pertinent labs & imaging results that were available during my care of the patient were reviewed by me and considered in my medical decision making (see chart for details).  Clinical Course      Final Clinical Impressions(s) / UC  Diagnoses   Final diagnoses:  Fall  Bilateral low back pain without sciatica  Hip pain, left  Lumbar strain, initial encounter    New Prescriptions Discharge Medication List as of 04/03/2016 12:19 PM    START taking these medications   Details  cyclobenzaprine (FLEXERIL) 10 MG tablet Take 1 tablet (10 mg total) by mouth at bedtime., Starting Mon 04/03/2016, Normal    HYDROcodone-acetaminophen (NORCO/VICODIN) 5-325 MG tablet 1-2 tabs po bid prn, Print    predniSONE (DELTASONE) 10 MG tablet 1 tab po qd for 5 days, Normal       1. diagnosis reviewed with patient 2. rx as per orders above; reviewed possible side effects, interactions, risks and benefits  3. Recommend supportive treatment with rest, heat/ice 4. Follow-up prn if symptoms worsen or don't improve   Payton Mccallumrlando Luanne Krzyzanowski, MD 04/03/16 1416

## 2016-04-03 NOTE — ED Triage Notes (Signed)
Patient complains of back pain, she says she has chest pains from Heart disease and she had an episode of chest pains about 3 weeks ago and she got up to go get her nitro and while she was walking she started vomiting and she blacked out because the pain was so bad, She fell and hit the column on the front porch. Nothing makes it better and it gets worse as the day goes on.

## 2016-04-04 ENCOUNTER — Ambulatory Visit: Payer: BLUE CROSS/BLUE SHIELD | Admitting: Psychology

## 2016-04-11 ENCOUNTER — Ambulatory Visit: Payer: BLUE CROSS/BLUE SHIELD | Admitting: Psychology

## 2016-06-15 ENCOUNTER — Ambulatory Visit (INDEPENDENT_AMBULATORY_CARE_PROVIDER_SITE_OTHER): Payer: BLUE CROSS/BLUE SHIELD | Admitting: Psychology

## 2016-06-15 DIAGNOSIS — F4323 Adjustment disorder with mixed anxiety and depressed mood: Secondary | ICD-10-CM

## 2016-07-13 DIAGNOSIS — I201 Angina pectoris with documented spasm: Secondary | ICD-10-CM | POA: Diagnosis not present

## 2016-07-13 DIAGNOSIS — I209 Angina pectoris, unspecified: Secondary | ICD-10-CM | POA: Diagnosis not present

## 2016-08-11 ENCOUNTER — Other Ambulatory Visit: Payer: Self-pay | Admitting: Internal Medicine

## 2016-08-22 ENCOUNTER — Ambulatory Visit (INDEPENDENT_AMBULATORY_CARE_PROVIDER_SITE_OTHER): Payer: BLUE CROSS/BLUE SHIELD | Admitting: Psychology

## 2016-08-22 DIAGNOSIS — F4323 Adjustment disorder with mixed anxiety and depressed mood: Secondary | ICD-10-CM

## 2016-08-29 ENCOUNTER — Encounter: Payer: Self-pay | Admitting: Internal Medicine

## 2016-08-29 ENCOUNTER — Ambulatory Visit (INDEPENDENT_AMBULATORY_CARE_PROVIDER_SITE_OTHER): Payer: BLUE CROSS/BLUE SHIELD | Admitting: Internal Medicine

## 2016-08-29 VITALS — BP 106/68 | HR 81 | Temp 98.2°F | Wt 225.0 lb

## 2016-08-29 DIAGNOSIS — R1031 Right lower quadrant pain: Secondary | ICD-10-CM | POA: Diagnosis not present

## 2016-08-29 DIAGNOSIS — R252 Cramp and spasm: Secondary | ICD-10-CM | POA: Diagnosis not present

## 2016-08-29 DIAGNOSIS — Z23 Encounter for immunization: Secondary | ICD-10-CM | POA: Diagnosis not present

## 2016-08-29 LAB — BASIC METABOLIC PANEL
BUN: 11 mg/dL (ref 6–23)
CHLORIDE: 110 meq/L (ref 96–112)
CO2: 25 meq/L (ref 19–32)
CREATININE: 1.25 mg/dL — AB (ref 0.40–1.20)
Calcium: 8.6 mg/dL (ref 8.4–10.5)
GFR: 50.38 mL/min — ABNORMAL LOW (ref >60.00)
Glucose, Bld: 82 mg/dL (ref 70–99)
POTASSIUM: 4.4 meq/L (ref 3.5–5.1)
Sodium: 139 mEq/L (ref 135–145)

## 2016-08-29 LAB — MAGNESIUM: MAGNESIUM: 2.1 mg/dL (ref 1.5–2.5)

## 2016-08-29 MED ORDER — QUETIAPINE FUMARATE 50 MG PO TABS: ORAL_TABLET | ORAL | 0 refills | Status: DC

## 2016-08-29 NOTE — Patient Instructions (Signed)
Muscle Cramps and Spasms Muscle cramps and spasms are when muscles tighten by themselves. They usually get better within minutes. Muscle cramps are painful. They are usually stronger and last longer than muscle spasms. Muscle spasms may or may not be painful. They can last a few seconds or much longer. HOME CARE  Drink enough fluid to keep your pee (urine) clear or pale yellow.  Massage, stretch, and relax the muscle.  Use a warm towel, heating pad, or warm shower water on tight muscles.  Place ice on the muscle if it is tender or in pain.  Put ice in a plastic bag.  Place a towel between your skin and the bag.  Leave the ice on for 15-20 minutes, 3-4 times a day.  Only take medicine as told by your doctor. GET HELP RIGHT AWAY IF:  Your cramps or spasms get worse, happen more often, or do not get better with time. MAKE SURE YOU:  Understand these instructions.  Will watch your condition.  Will get help right away if you are not doing well or get worse. This information is not intended to replace advice given to you by your health care provider. Make sure you discuss any questions you have with your health care provider. Document Released: 07/13/2008 Document Revised: 11/25/2012 Document Reviewed: 05/04/2015 Elsevier Interactive Patient Education  2017 Elsevier Inc.  

## 2016-08-29 NOTE — Progress Notes (Signed)
Subjective:    Patient ID: Dana Miller, female    DOB: 1976/08/08, 41 y.o.   MRN: [id number]  HPI  Pt presents to the clinic today with a few concerns.  1- She is c/o bilateral lower leg cramps. This started 6 weeks ago. She describes the pain as multiple charlie horses that occur in multiple spots at the same time. They occur mostly in her calf and groin. Her right leg seems worse than her left leg. The symptoms can occur during the day but often seem worse at night. She denies any numbness, tingling, abnormal crawling sensation or leg jerking. She has increased her water intake, and has tried to eat mor calcium and potassium rich foods.  She has also tried stretching and hot baths without any relief.  2- She also c/o RLQ cramping. This also started 6 weeks ago. It is intermittent. It is not associated with food intake. She denies nausea, vomiting, constipation or diarrhea. The pain is not worse with certain movements. She has not taken anything OTC for this.  3- She would like her flu shot today.  Review of Systems      Past Medical History:  Diagnosis Date  . Anxiety   . Cardiac microvascular disease (HCC)   . Chicken pox   . Depression   . Thyroid mass    right side    Current Outpatient Prescriptions  Medication Sig Dispense Refill  . aspirin EC 81 MG tablet Take 1 tablet by mouth daily.    Marland Kitchen atorvastatin (LIPITOR) 20 MG tablet Take 1 tablet by mouth daily.  11  . cyclobenzaprine (FLEXERIL) 10 MG tablet Take 1 tablet (10 mg total) by mouth at bedtime. 15 tablet 0  . EPINEPHrine 0.3 mg/0.3 mL IJ SOAJ injection Inject 0.3 mLs (0.3 mg total) into the muscle as needed. For Anaphylaxis 1 Device 0  . fexofenadine (ALLEGRA) 180 MG tablet Take 1 tablet by mouth daily.    Marland Kitchen HYDROcodone-acetaminophen (NORCO/VICODIN) 5-325 MG tablet 1-2 tabs po bid prn 6 tablet 0  . isosorbide dinitrate (ISORDIL) 20 MG tablet Take 1 tablet by mouth 3 (three) times daily.    . nitroGLYCERIN  (NITROSTAT) 0.4 MG SL tablet Place 0.4 mg under the tongue every 5 (five) minutes as needed for chest pain.    . predniSONE (DELTASONE) 10 MG tablet 1 tab po qd for 5 days 5 tablet 0  . QUEtiapine (SEROQUEL) 50 MG tablet Take 25-50mg  twice a day as needed for anxiety/panic attacks. Take 50mg  nightly for insomnia.    Marland Kitchen RANEXA 500 MG 12 hr tablet Take 1 tablet by mouth 2 (two) times daily.  11  . Selenium Sulfide 2.25 % SHAM Apply 1 application topically daily as needed. 1 Bottle 3  . topiramate (TOPAMAX) 100 MG tablet TAKE 1 TABLET DAILY 90 tablet 1  . venlafaxine XR (EFFEXOR-XR) 75 MG 24 hr capsule Take 3 capsules by mouth daily.    . Vitamin D, Ergocalciferol, (DRISDOL) 50000 units CAPS capsule Take 1 capsule (50,000 Units total) by mouth every 7 (seven) days. 12 capsule 0  . amLODipine (NORVASC) 10 MG tablet Take 1 tablet by mouth daily.    . carvedilol (COREG) 25 MG tablet Take 1 tablet by mouth 2 (two) times daily.     No current facility-administered medications for this visit.     Allergies  Allergen Reactions  . Fentanyl Swelling  . Mirtazapine Swelling  . Penicillins Anaphylaxis  . Sulfa Antibiotics Hives  . Ms Contin  [  Morphine] Swelling    Family History  Problem Relation Age of Onset  . Depression Sister   . Anxiety disorder Sister   . Cancer Paternal Uncle     Lung  . Depression Maternal Grandmother   . Heart disease Paternal Grandmother   . Stroke Paternal Grandmother   . Diabetes Paternal Grandmother   . Diabetes Paternal Grandfather     Social History   Social History  . Marital status: Married    Spouse name: N/A  . Number of children: N/A  . Years of education: N/A   Occupational History  . Not on file.   Social History Main Topics  . Smoking status: Current Every Day Smoker    Packs/day: 0.50    Types: Cigarettes  . Smokeless tobacco: Never Used  . Alcohol use 0.0 oz/week     Comment: occasional  . Drug use: No  . Sexual activity: Yes   Other  Topics Concern  . Not on file   Social History Narrative  . No narrative on file     Constitutional: Denies fever, malaise, fatigue, headache or abrupt weight changes.  Gastrointestinal: Pt reports abdominal pain. Denies bloating, constipation, diarrhea or blood in the stool.  GU: Denies urgency, frequency, pain with urination, burning sensation, blood in urine, odor or discharge. Musculoskeletal: Pt reports leg cramps. Denies decrease in range of motion, difficulty with gait, or joint pain and swelling.    No other specific complaints in a complete review of systems (except as listed in HPI above).  Objective:   Physical Exam    BP 106/68   Pulse 81   Temp 98.2 F (36.8 C) (Oral)   Wt 225 lb (102.1 kg)   LMP 08/08/2016   SpO2 99%   BMI 36.32 kg/m  Wt Readings from Last 3 Encounters:  08/29/16 225 lb (102.1 kg)  04/03/16 230 lb (104.3 kg)  03/03/16 237 lb (107.5 kg)    General: Appears her stated age, obese in NAD. Abdomen: Soft and nontender. Normal bowel sounds. No distention or masses noted.  Musculoskeletal: Calves nontender with palpation. No difficulty with gait.  BMET    Component Value Date/Time   NA 139 03/03/2016 1406   K 4.0 03/03/2016 1406   CL 110 03/03/2016 1406   CO2 24 03/03/2016 1406   GLUCOSE 82 03/03/2016 1406   BUN 12 03/03/2016 1406   CREATININE 1.25 (H) 03/03/2016 1406   CALCIUM 8.9 03/03/2016 1406    Lipid Panel     Component Value Date/Time   CHOL 143 03/03/2016 1406   TRIG 64.0 03/03/2016 1406   HDL 55.20 03/03/2016 1406   CHOLHDL 3 03/03/2016 1406   VLDL 12.8 03/03/2016 1406   LDLCALC 75 03/03/2016 1406    CBC    Component Value Date/Time   WBC 5.9 03/03/2016 1406   RBC 3.92 03/03/2016 1406   HGB 12.8 03/03/2016 1406   HCT 38.1 03/03/2016 1406   PLT 315.0 03/03/2016 1406   MCV 97.1 03/03/2016 1406   MCHC 33.6 03/03/2016 1406   RDW 14.3 03/03/2016 1406   LYMPHSABS 1.4 07/20/2015 0926   MONOABS 0.5 07/20/2015 0926    EOSABS 0.3 07/20/2015 0926   BASOSABS 0.0 07/20/2015 0926    Hgb A1C Lab Results  Component Value Date   HGBA1C 5.3 01/04/2015          Assessment & Plan:   Muscle cramps of BLE:  Does not sound like restless leg Will check BMET and magnesioum today If  labs normal, consider holding stating for 2 weeks to see if this is the cause Advised her to consume plenty of water and take a tsp of yellow mustard if she has cramps again  ot see if this helps  RLQ pain:  Exam benign ? Muscle strain Will monitor for now Need for influenza vaccine:  Flu shot today  Will follow up after labs, RTC as needed Nicki Reaper, NP

## 2016-08-30 ENCOUNTER — Encounter: Payer: Self-pay | Admitting: Internal Medicine

## 2016-08-31 ENCOUNTER — Encounter: Payer: Self-pay | Admitting: Internal Medicine

## 2016-09-05 ENCOUNTER — Other Ambulatory Visit: Payer: Self-pay | Admitting: Internal Medicine

## 2016-09-05 DIAGNOSIS — R7989 Other specified abnormal findings of blood chemistry: Secondary | ICD-10-CM

## 2016-09-05 DIAGNOSIS — R252 Cramp and spasm: Secondary | ICD-10-CM

## 2016-09-05 MED ORDER — PRAMIPEXOLE DIHYDROCHLORIDE 0.5 MG PO TABS: 0.5000 mg | ORAL_TABLET | Freq: Every day | ORAL | 2 refills | Status: DC

## 2016-09-28 ENCOUNTER — Other Ambulatory Visit: Payer: Self-pay | Admitting: Internal Medicine

## 2016-11-14 ENCOUNTER — Other Ambulatory Visit: Payer: Self-pay | Admitting: Internal Medicine

## 2016-11-14 DIAGNOSIS — R7989 Other specified abnormal findings of blood chemistry: Secondary | ICD-10-CM

## 2016-11-14 NOTE — Progress Notes (Signed)
Referral placed.

## 2016-11-15 ENCOUNTER — Ambulatory Visit: Payer: Self-pay | Admitting: Psychology

## 2016-11-23 ENCOUNTER — Ambulatory Visit (INDEPENDENT_AMBULATORY_CARE_PROVIDER_SITE_OTHER): Payer: Medicaid Other | Admitting: Psychology

## 2016-11-23 ENCOUNTER — Encounter: Payer: Self-pay | Admitting: Internal Medicine

## 2016-11-23 DIAGNOSIS — F4323 Adjustment disorder with mixed anxiety and depressed mood: Secondary | ICD-10-CM | POA: Diagnosis not present

## 2016-11-24 MED ORDER — TOPIRAMATE 100 MG PO TABS: 100.0000 mg | ORAL_TABLET | Freq: Every day | ORAL | 0 refills | Status: DC

## 2016-12-14 ENCOUNTER — Ambulatory Visit (INDEPENDENT_AMBULATORY_CARE_PROVIDER_SITE_OTHER): Payer: Medicaid Other | Admitting: Psychology

## 2016-12-14 DIAGNOSIS — F4323 Adjustment disorder with mixed anxiety and depressed mood: Secondary | ICD-10-CM

## 2016-12-29 ENCOUNTER — Other Ambulatory Visit: Payer: Self-pay | Admitting: Internal Medicine

## 2016-12-29 NOTE — Telephone Encounter (Signed)
Last filled 09/28/16 with 2 refills-- please advise if okay to refill

## 2016-12-29 NOTE — Telephone Encounter (Signed)
I know I filled this for her but [name]. Is she still seeing him?

## 2017-01-03 ENCOUNTER — Telehealth: Payer: Self-pay | Admitting: Internal Medicine

## 2017-01-03 DIAGNOSIS — R252 Cramp and spasm: Secondary | ICD-10-CM

## 2017-01-03 NOTE — Telephone Encounter (Signed)
Last filled 09/05/16-- please advise

## 2017-01-03 NOTE — Telephone Encounter (Signed)
Left detailed msg on VM per HIPAA asking pt if she is still seeing Dr Lucianne MussKumar who fills this medication

## 2017-01-04 NOTE — Telephone Encounter (Signed)
Patient returned Dana Miller's call.  Patient said [name] left.  She has an appointment with her therapist and she'll discuss with the therapist about who to see. The two she recommended before were out of network with her insurance.  Patient is requesting Dana Miller to refill for one more month and hopefully she can get in with someone else. Patient can be reached at [phone].

## 2017-01-04 NOTE — Telephone Encounter (Signed)
Left detailed msg on VM per HIPAA  

## 2017-01-04 NOTE — Telephone Encounter (Signed)
Ok to refill Seroquel

## 2017-01-04 NOTE — Telephone Encounter (Signed)
Dana Miller, Dana Miller 33 minutes ago (3:35 PM)      Patient returned Dana Miller's call.  Patient said [name] left.  She has an appointment with her therapist and she'll discuss with the therapist about who to see. The two she recommended before were out of network with her insurance.  Patient is requesting Dana KocherRegina to refill for one more month and hopefully she can get in with someone else. Patient can be reached at [phone].

## 2017-01-09 MED ORDER — QUETIAPINE FUMARATE 50 MG PO TABS: ORAL_TABLET | ORAL | 2 refills | Status: DC

## 2017-01-09 NOTE — Addendum Note (Signed)
Addended by: Lorre MunroeBAITY, Kreston Ahrendt W on: 01/09/2017 09:41 AM   Modules accepted: Orders

## 2017-01-18 ENCOUNTER — Ambulatory Visit (INDEPENDENT_AMBULATORY_CARE_PROVIDER_SITE_OTHER): Payer: Medicaid Other | Admitting: Psychology

## 2017-01-18 DIAGNOSIS — F4323 Adjustment disorder with mixed anxiety and depressed mood: Secondary | ICD-10-CM | POA: Diagnosis not present

## 2017-01-18 DIAGNOSIS — N183 Chronic kidney disease, stage 3 (moderate): Secondary | ICD-10-CM | POA: Diagnosis not present

## 2017-02-08 ENCOUNTER — Encounter: Payer: Self-pay | Admitting: Internal Medicine

## 2017-02-08 ENCOUNTER — Ambulatory Visit (INDEPENDENT_AMBULATORY_CARE_PROVIDER_SITE_OTHER): Payer: BLUE CROSS/BLUE SHIELD | Admitting: Internal Medicine

## 2017-02-08 VITALS — BP 116/74 | HR 74 | Temp 98.1°F | Ht 66.0 in | Wt 214.0 lb

## 2017-02-08 DIAGNOSIS — G43819 Other migraine, intractable, without status migrainosus: Secondary | ICD-10-CM | POA: Diagnosis not present

## 2017-02-08 DIAGNOSIS — Z1239 Encounter for other screening for malignant neoplasm of breast: Secondary | ICD-10-CM

## 2017-02-08 DIAGNOSIS — F329 Major depressive disorder, single episode, unspecified: Secondary | ICD-10-CM

## 2017-02-08 DIAGNOSIS — I2589 Other forms of chronic ischemic heart disease: Secondary | ICD-10-CM

## 2017-02-08 DIAGNOSIS — Z9109 Other allergy status, other than to drugs and biological substances: Secondary | ICD-10-CM | POA: Diagnosis not present

## 2017-02-08 DIAGNOSIS — Z1231 Encounter for screening mammogram for malignant neoplasm of breast: Secondary | ICD-10-CM | POA: Diagnosis not present

## 2017-02-08 DIAGNOSIS — F419 Anxiety disorder, unspecified: Secondary | ICD-10-CM

## 2017-02-08 DIAGNOSIS — F32A Depression, unspecified: Secondary | ICD-10-CM

## 2017-02-08 DIAGNOSIS — G2581 Restless legs syndrome: Secondary | ICD-10-CM

## 2017-02-08 DIAGNOSIS — Z0001 Encounter for general adult medical examination with abnormal findings: Secondary | ICD-10-CM

## 2017-02-08 LAB — COMPREHENSIVE METABOLIC PANEL
ALK PHOS: 67 U/L (ref 39–117)
ALT: 7 U/L (ref 0–35)
AST: 13 U/L (ref 0–37)
Albumin: 4 g/dL (ref 3.5–5.2)
BILIRUBIN TOTAL: 0.3 mg/dL (ref 0.2–1.2)
BUN: 7 mg/dL (ref 6–23)
CALCIUM: 9.1 mg/dL (ref 8.4–10.5)
CO2: 27 mEq/L (ref 19–32)
CREATININE: 1.13 mg/dL (ref 0.40–1.20)
Chloride: 109 mEq/L (ref 96–112)
GFR: 56.48 mL/min — AB (ref >60.00)
Glucose, Bld: 91 mg/dL (ref 70–99)
Potassium: 4.2 mEq/L (ref 3.5–5.1)
Sodium: 141 mEq/L (ref 135–145)
TOTAL PROTEIN: 6.6 g/dL (ref 6.0–8.3)

## 2017-02-08 LAB — CBC
HCT: 38.9 % (ref 36.0–46.0)
HEMOGLOBIN: 13.3 g/dL (ref 12.0–15.0)
MCHC: 34 g/dL (ref 30.0–36.0)
MCV: 100.7 fl — ABNORMAL HIGH (ref 78.0–100.0)
PLATELETS: 308 10*3/uL (ref 150.0–400.0)
RBC: 3.87 Mil/uL (ref 3.87–5.11)
RDW: 13.8 % (ref 11.5–15.5)
WBC: 7.1 10*3/uL (ref 4.0–10.5)

## 2017-02-08 LAB — LIPID PANEL
Cholesterol: 167 mg/dL (ref 0–200)
HDL: 48.1 mg/dL (ref >39.00)
LDL Cholesterol: 88 mg/dL (ref 0–99)
NONHDL: 118.59
TRIGLYCERIDES: 154 mg/dL — AB (ref 0.0–149.0)
Total CHOL/HDL Ratio: 3
VLDL: 30.8 mg/dL (ref 0.0–40.0)

## 2017-02-08 LAB — HEMOGLOBIN A1C: Hgb A1c MFr Bld: 5.5 % (ref 4.6–6.5)

## 2017-02-08 MED ORDER — QUETIAPINE FUMARATE 50 MG PO TABS: ORAL_TABLET | ORAL | 5 refills | Status: DC

## 2017-02-08 MED ORDER — TOPIRAMATE 100 MG PO TABS: 100.0000 mg | ORAL_TABLET | Freq: Every day | ORAL | 11 refills | Status: DC

## 2017-02-08 MED ORDER — VENLAFAXINE HCL ER 75 MG PO CP24: 225.0000 mg | ORAL_CAPSULE | Freq: Every day | ORAL | 11 refills | Status: DC

## 2017-02-08 NOTE — Assessment & Plan Note (Signed)
She plans on seeing a dentist to see if her TMJ is contributing She will continue Topamax for now, refilled today

## 2017-02-08 NOTE — Assessment & Plan Note (Signed)
She will continue to follow with cardiology Continue Amlodipine, Carvedilol, Ranexa, Lipitor and ASA

## 2017-02-08 NOTE — Telephone Encounter (Signed)
Printed and placed in your box. 

## 2017-02-08 NOTE — Assessment & Plan Note (Signed)
Continue daily Allegra Will monitor 

## 2017-02-08 NOTE — Progress Notes (Signed)
Subjective:    Patient ID: Dana Miller, female    DOB: 1976/05/15, 41 y.o.   MRN: [id number]  HPI  Pt presents to the clinic today for her annual exam. She is also due to follow up chronic conditions.  Anxiety and Depression: Triggered by her health issues. She takes Effexor daily as prescribed and Seroquel at night. She is no longer following with Dr. Lucianne Muss at [facility]. She is no longer seeing her therapist, but is establishing care with a new therapist soon. She is requesting a refill of her Effexor today.  Microvascular Heart Disease: She follows with Dr. Vidal Schwalbe at [facility]. She is taking Amlodipine, Carvedilol, Ranexa and Lipitor and ASA. She takes Nitro on an as needed basis.  She has chronic chest pain.  RLS: She reports the Mirapex helps her with the leg cramps.  Seasonal Allergies: These occur all year long. She takes Allegra daily as prescribed.   Migraines: These occur a few times a week. She is not sure what her triggers are but thinks it might be her TMJ. She takes Topamax daily and does need a refill of this today.  Flu: 08/2016 Tetanus: 12/2014 Pap Smear: 12/2014 Mammogram: never, [facility] Vision Screening: as needed Dentist: biannually  Diet: She does eat meat. She consumes fruits and veggies daily. She avoids fried foods. She drinks mostly Green Tea, some coffee. Exercise: None  Review of Systems      Past Medical History:  Diagnosis Date  . Anxiety   . Cardiac microvascular disease   . Chicken pox   . Depression   . Thyroid mass    right side    Current Outpatient Prescriptions  Medication Sig Dispense Refill  . aspirin EC 81 MG tablet Take 1 tablet by mouth daily.    Marland Kitchen atorvastatin (LIPITOR) 20 MG tablet Take 1 tablet by mouth daily.  11  . cyclobenzaprine (FLEXERIL) 10 MG tablet Take 1 tablet (10 mg total) by mouth at bedtime. 15 tablet 0  . EPINEPHrine 0.3 mg/0.3 mL IJ SOAJ injection Inject 0.3 mLs (0.3 mg total) into the muscle as  needed. For Anaphylaxis 1 Device 0  . fexofenadine (ALLEGRA) 180 MG tablet Take 1 tablet by mouth daily.    Marland Kitchen HYDROcodone-acetaminophen (NORCO/VICODIN) 5-325 MG tablet 1-2 tabs po bid prn 6 tablet 0  . isosorbide dinitrate (ISORDIL) 20 MG tablet Take 1 tablet by mouth 3 (three) times daily.    . nitroGLYCERIN (NITROSTAT) 0.4 MG SL tablet Place 0.4 mg under the tongue every 5 (five) minutes as needed for chest pain.    . pramipexole (MIRAPEX) 0.5 MG tablet TAKE 1 TABLET (0.5 MG TOTAL) BY MOUTH AT BEDTIME. 30 tablet 2  . QUEtiapine (SEROQUEL) 50 MG tablet TAKE 1/2 TO 1 TABLET BY MOUTH TWICE A DAY AS NEEDED FOR PANIC ATTACKS, TAKE 1 NIGHTLY FOR INSOMNIA 60 tablet 2  . RANEXA 500 MG 12 hr tablet Take 1 tablet by mouth 2 (two) times daily.  11  . Selenium Sulfide 2.25 % SHAM Apply 1 application topically daily as needed. 1 Bottle 3  . topiramate (TOPAMAX) 100 MG tablet Take 1 tablet (100 mg total) by mouth daily. 90 tablet 0  . venlafaxine XR (EFFEXOR-XR) 75 MG 24 hr capsule Take 3 capsules by mouth daily.    Marland Kitchen amLODipine (NORVASC) 10 MG tablet Take 1 tablet by mouth daily.    . carvedilol (COREG) 25 MG tablet Take 1 tablet by mouth 2 (two) times  daily.     No current facility-administered medications for this visit.     Allergies  Allergen Reactions  . Fentanyl Swelling  . Mirtazapine Swelling  . Penicillins Anaphylaxis  . Sulfa Antibiotics Hives  . Ms Contin  [Morphine] Swelling    Family History  Problem Relation Age of Onset  . Depression Sister   . Anxiety disorder Sister   . Cancer Paternal Uncle        Lung  . Depression Maternal Grandmother   . Heart disease Paternal Grandmother   . Stroke Paternal Grandmother   . Diabetes Paternal Grandmother   . Diabetes Paternal Grandfather     Social History   Social History  . Marital status: Married    Spouse name: N/A  . Number of children: N/A  . Years of education: N/A   Occupational History  . Not on file.   Social  History Main Topics  . Smoking status: Current Every Day Smoker    Packs/day: 0.50    Types: Cigarettes  . Smokeless tobacco: Never Used  . Alcohol use 0.0 oz/week     Comment: occasional  . Drug use: No  . Sexual activity: Yes   Other Topics Concern  . Not on file   Social History Narrative  . No narrative on file     Constitutional: Pt reports headaches. Denies fever, malaise, fatigue, or abrupt weight changes.  HEENT: Denies eye pain, eye redness, ear pain, ringing in the ears, wax buildup, runny nose, nasal congestion, bloody nose, or sore throat. Respiratory: Denies difficulty breathing, shortness of breath, cough or sputum production.   Cardiovascular: Pt reports chronic chest pain. Denies chest tightness, palpitations or swelling in the hands or feet.  Gastrointestinal: Pt reports loose stools.  Denies abdominal pain, bloating, constipation, or blood in the stool.  GU: Denies urgency, frequency, pain with urination, burning sensation, blood in urine, odor or discharge. Musculoskeletal: Pt reports muscle cramps. Denies decrease in range of motion, difficulty with gait, or joint pain and swelling.  Skin: Denies redness, rashes, lesions or ulcercations.  Neurological: Denies dizziness, difficulty with memory, difficulty with speech or problems with balance and coordination.  Psych: Pt has a history of anxiety and depression. Denies SI/HI.  No other specific complaints in a complete review of systems (except as listed in HPI above).  Objective:   Physical Exam    BP 116/74   Pulse 74   Temp 98.1 F (36.7 C) (Oral)   Ht 5\' 6"  (1.676 m)   Wt 214 lb (97.1 kg)   LMP 02/02/2017   SpO2 98%   BMI 34.54 kg/m  Wt Readings from Last 3 Encounters:  02/08/17 214 lb (97.1 kg)  08/29/16 225 lb (102.1 kg)  04/03/16 230 lb (104.3 kg)    General: Appears her stated age, obese in NAD. Skin: Warm, dry and intact.  HEENT: Head: normal shape and size; Eyes: sclera white, no  icterus, conjunctiva pink, PERRLA and EOMs intact; Ears: Tm's gray and intact, normal light reflex; Throat/Mouth: Teeth present, mucosa pink and moist, no exudate, lesions or ulcerations noted.  Neck:  Neck supple, trachea midline. No masses, lumps present.  Cardiovascular: Normal rate and rhythm. S1,S2 noted.  No murmur, rubs or gallops noted. No JVD or BLE edema.  Pulmonary/Chest: Normal effort and positive vesicular breath sounds. No respiratory distress. No wheezes, rales or ronchi noted.  Abdomen: Soft and nontender. Normal bowel sounds. No distention or masses noted.  Musculoskeletal: Strength 5/5 BUE/BLE. No difficulty  with gait.  Neurological: Alert and oriented. Cranial nerves II-XII grossly intact. Coordination normal.  Psychiatric: Mood and affect normal. Behavior is normal. Judgment and thought content normal.    BMET    Component Value Date/Time   NA 139 08/29/2016 1150   K 4.4 08/29/2016 1150   CL 110 08/29/2016 1150   CO2 25 08/29/2016 1150   GLUCOSE 82 08/29/2016 1150   BUN 11 08/29/2016 1150   CREATININE 1.25 (H) 08/29/2016 1150   CALCIUM 8.6 08/29/2016 1150    Lipid Panel     Component Value Date/Time   CHOL 143 03/03/2016 1406   TRIG 64.0 03/03/2016 1406   HDL 55.20 03/03/2016 1406   CHOLHDL 3 03/03/2016 1406   VLDL 12.8 03/03/2016 1406   LDLCALC 75 03/03/2016 1406    CBC    Component Value Date/Time   WBC 5.9 03/03/2016 1406   RBC 3.92 03/03/2016 1406   HGB 12.8 03/03/2016 1406   HCT 38.1 03/03/2016 1406   PLT 315.0 03/03/2016 1406   MCV 97.1 03/03/2016 1406   MCHC 33.6 03/03/2016 1406   RDW 14.3 03/03/2016 1406   LYMPHSABS 1.4 07/20/2015 0926   MONOABS 0.5 07/20/2015 0926   EOSABS 0.3 07/20/2015 0926   BASOSABS 0.0 07/20/2015 0926    Hgb A1C Lab Results  Component Value Date   HGBA1C 5.3 01/04/2015          Assessment & Plan:   Preventative Health Maintenance:  Encouraged her to get a flu shot in the fall Tetanus UTD Pap smear  UTD Mammogram ordered, [facility]- number provided Encouraged her to consume a balanced diet and exercise regimen Advised her to see an eye doctor and dentist annually Will check CBC, CMET, Lipid and A1C today  RTC in 1 year, sooner if needed Nicki ReaperBAITY, REGINA, NP

## 2017-02-08 NOTE — Assessment & Plan Note (Signed)
Persistent but controlled on Effexor and Seroquel She is trying to establish care with a new psychiatrist and therapist Effexor and Seroquel refilled today

## 2017-02-08 NOTE — Assessment & Plan Note (Signed)
Controlled with Mirapex Will monitor

## 2017-02-08 NOTE — Patient Instructions (Signed)

## 2017-02-09 ENCOUNTER — Telehealth: Payer: Self-pay | Admitting: Internal Medicine

## 2017-02-09 ENCOUNTER — Encounter: Payer: Self-pay | Admitting: Internal Medicine

## 2017-02-09 NOTE — Telephone Encounter (Signed)
Pt's husband dropped off form to be filled out. Placed in RX tower. Can call [phone].

## 2017-02-09 NOTE — Telephone Encounter (Signed)
Form has been completed and place in front office for pick up. Pt aware via mychart msg.Dana Miller.Dana Miller..Dana Miller

## 2017-03-13 ENCOUNTER — Other Ambulatory Visit: Payer: Self-pay | Admitting: Internal Medicine

## 2017-03-13 DIAGNOSIS — Z1231 Encounter for screening mammogram for malignant neoplasm of breast: Secondary | ICD-10-CM

## 2017-03-29 ENCOUNTER — Ambulatory Visit (INDEPENDENT_AMBULATORY_CARE_PROVIDER_SITE_OTHER): Payer: Medicaid Other | Admitting: Psychology

## 2017-03-29 DIAGNOSIS — F4323 Adjustment disorder with mixed anxiety and depressed mood: Secondary | ICD-10-CM

## 2017-03-30 ENCOUNTER — Encounter: Payer: Self-pay | Admitting: Internal Medicine

## 2017-04-18 ENCOUNTER — Ambulatory Visit: Payer: Self-pay | Admitting: Psychology

## 2017-04-28 DIAGNOSIS — J189 Pneumonia, unspecified organism: Secondary | ICD-10-CM | POA: Diagnosis not present

## 2017-05-09 ENCOUNTER — Encounter: Payer: Self-pay | Admitting: Internal Medicine

## 2017-07-02 ENCOUNTER — Other Ambulatory Visit: Payer: Self-pay | Admitting: Family Medicine

## 2017-07-02 ENCOUNTER — Other Ambulatory Visit: Payer: Self-pay | Admitting: Internal Medicine

## 2017-07-02 DIAGNOSIS — R252 Cramp and spasm: Secondary | ICD-10-CM

## 2017-07-02 NOTE — Telephone Encounter (Signed)
Electronic refill request. Mirapex Last office visit:   02/08/17 Last Filled:    30 tablet 2 01/03/2017  Please advise.

## 2017-07-05 ENCOUNTER — Encounter: Payer: Self-pay | Admitting: Internal Medicine

## 2017-07-09 MED ORDER — BUSPIRONE HCL 5 MG PO TABS: 5.0000 mg | ORAL_TABLET | Freq: Two times a day (BID) | ORAL | 0 refills | Status: DC

## 2017-07-09 NOTE — Addendum Note (Signed)
Addended by: Lorre MunroeBAITY, REGINA W on: 07/09/2017 04:32 PM   Modules accepted: Orders

## 2017-07-17 ENCOUNTER — Encounter: Payer: Self-pay | Admitting: Internal Medicine

## 2017-07-17 MED ORDER — VENLAFAXINE HCL ER 75 MG PO CP24: 225.0000 mg | ORAL_CAPSULE | Freq: Every day | ORAL | 4 refills | Status: DC

## 2017-07-29 ENCOUNTER — Other Ambulatory Visit: Payer: Self-pay | Admitting: Family Medicine

## 2017-07-29 ENCOUNTER — Other Ambulatory Visit: Payer: Self-pay | Admitting: Internal Medicine

## 2017-07-29 DIAGNOSIS — R252 Cramp and spasm: Secondary | ICD-10-CM

## 2017-07-30 NOTE — Telephone Encounter (Signed)
RG-Plz see refill req/thx dmf 

## 2017-08-01 NOTE — Telephone Encounter (Signed)
Last filled 07/03/2017... Please advise 

## 2017-08-06 ENCOUNTER — Ambulatory Visit: Payer: Medicaid Other | Admitting: Psychology

## 2017-09-13 ENCOUNTER — Other Ambulatory Visit: Payer: Self-pay | Admitting: Internal Medicine

## 2017-09-13 NOTE — Telephone Encounter (Signed)
Please advise if okay to refill. 

## 2017-10-02 ENCOUNTER — Other Ambulatory Visit: Payer: Self-pay | Admitting: Internal Medicine

## 2017-10-03 ENCOUNTER — Telehealth: Payer: Self-pay | Admitting: Internal Medicine

## 2017-10-03 MED ORDER — VENLAFAXINE HCL ER 75 MG PO CP24: 225.0000 mg | ORAL_CAPSULE | Freq: Every day | ORAL | 0 refills | Status: DC

## 2017-10-03 NOTE — Telephone Encounter (Signed)
Copied from CRM [phone]. Topic: General - Other >> Oct 03, 2017 10:27 AM Cecelia ByarsGreen, Shaniah Baltes L, RMA wrote: Reason for CRM: [facility] insurance will only pay for a 90 day prescription for venlafaxine XR (EFFEXOR-XR) 75 MG 24 hr capsule, and the prescription was only written for a 10- day supply pharmacy is requesting an adjustment please call pharmacy

## 2017-10-03 NOTE — Telephone Encounter (Signed)
Rx sent through e-scribe  

## 2017-10-23 ENCOUNTER — Ambulatory Visit: Payer: BLUE CROSS/BLUE SHIELD | Admitting: Nurse Practitioner

## 2017-10-23 DIAGNOSIS — J069 Acute upper respiratory infection, unspecified: Secondary | ICD-10-CM | POA: Diagnosis not present

## 2017-10-30 ENCOUNTER — Other Ambulatory Visit: Payer: Self-pay | Admitting: Internal Medicine

## 2017-11-09 DIAGNOSIS — Z0000 Encounter for general adult medical examination without abnormal findings: Secondary | ICD-10-CM | POA: Diagnosis not present

## 2017-11-14 ENCOUNTER — Other Ambulatory Visit: Payer: Self-pay | Admitting: Internal Medicine

## 2017-11-15 NOTE — Telephone Encounter (Signed)
Buspar was not on her med list the last time I saw her. Did someone else prescribe this? Did she used to take it and want to restart it?

## 2017-11-15 NOTE — Telephone Encounter (Signed)
At LOV it says pt is on Effexor and Seroquel... Please advise if appropriate to refill

## 2017-11-15 NOTE — Telephone Encounter (Signed)
Buspar was restarted 06/2017. Refilled today.

## 2017-11-23 ENCOUNTER — Ambulatory Visit (INDEPENDENT_AMBULATORY_CARE_PROVIDER_SITE_OTHER): Payer: BLUE CROSS/BLUE SHIELD | Admitting: Internal Medicine

## 2017-11-23 ENCOUNTER — Encounter: Payer: Self-pay | Admitting: Internal Medicine

## 2017-11-23 VITALS — BP 110/76 | HR 78 | Temp 98.4°F | Wt 209.0 lb

## 2017-11-23 DIAGNOSIS — H6983 Other specified disorders of Eustachian tube, bilateral: Secondary | ICD-10-CM | POA: Diagnosis not present

## 2017-11-23 DIAGNOSIS — N3946 Mixed incontinence: Secondary | ICD-10-CM

## 2017-11-23 DIAGNOSIS — R350 Frequency of micturition: Secondary | ICD-10-CM | POA: Diagnosis not present

## 2017-11-23 LAB — POC URINALSYSI DIPSTICK (AUTOMATED)
BILIRUBIN UA: NEGATIVE
Blood, UA: NEGATIVE
GLUCOSE UA: NEGATIVE
KETONES UA: NEGATIVE
Leukocytes, UA: NEGATIVE
Nitrite, UA: NEGATIVE
Protein, UA: NEGATIVE
SPEC GRAV UA: 1.02 (ref 1.010–1.025)
Urobilinogen, UA: 0.2 E.U./dL
pH, UA: 6 (ref 5.0–8.0)

## 2017-11-23 MED ORDER — PREDNISONE 10 MG PO TABS: ORAL_TABLET | ORAL | 0 refills | Status: DC

## 2017-11-23 MED ORDER — MIRABEGRON ER 25 MG PO TB24: 25.0000 mg | ORAL_TABLET | Freq: Every day | ORAL | 2 refills | Status: DC

## 2017-11-23 NOTE — Progress Notes (Signed)
Subjective:    Patient ID: Dana Miller, female    DOB: 1976-01-03, 42 y.o.   MRN: [id number]  HPI  Pt presents to the clinic today with c/o urinary frequency and mixed incontinence. She reports this has been going on for over 8 months. She reports she counted going at least 20 times one day. She has been making diet changes, drinking more water. She is now having to wear incontinence pads daily. She denies abdominal pain.  She also reports bilateral ear fullness. This started 7-8 weeks ago. She denies ear pain or drainage. She has been to UC for the same x 2, they advised her it could be ETD. She has been taking Flonase and an antihistamine OTC.  Review of Systems      Past Medical History:  Diagnosis Date  . Anxiety   . Cardiac microvascular disease   . Chicken pox   . Depression   . Thyroid mass    right side    Current Outpatient Medications  Medication Sig Dispense Refill  . aspirin EC 81 MG tablet Take 1 tablet by mouth daily.    Marland Kitchen atorvastatin (LIPITOR) 20 MG tablet Take 1 tablet by mouth daily.  11  . busPIRone (BUSPAR) 5 MG tablet TAKE 1 TABLET(5 MG) BY MOUTH TWICE DAILY 180 tablet 0  . EPINEPHrine 0.3 mg/0.3 mL IJ SOAJ injection Inject 0.3 mLs (0.3 mg total) into the muscle as needed. For Anaphylaxis 1 Device 0  . fexofenadine (ALLEGRA) 180 MG tablet Take 1 tablet by mouth daily.    . nitroGLYCERIN (NITROSTAT) 0.4 MG SL tablet Place 0.4 mg under the tongue every 5 (five) minutes as needed for chest pain.    . pramipexole (MIRAPEX) 0.5 MG tablet TAKE 1 TABLET BY MOUTH EVERY NIGHT AT BEDTIME 30 tablet 5  . QUEtiapine (SEROQUEL) 50 MG tablet Take 0.5-1 tablets (25-50 mg total) by mouth 2 (two) times daily as needed. 60 tablet 2  . Selenium Sulfide 2.25 % SHAM Apply 1 application topically daily as needed. 1 Bottle 3  . topiramate (TOPAMAX) 100 MG tablet Take 1 tablet (100 mg total) by mouth daily. 30 tablet 11  . venlafaxine XR (EFFEXOR-XR) 75 MG 24 hr capsule Take 3  capsules (225 mg total) by mouth daily with breakfast. 270 capsule 0  . amLODipine (NORVASC) 10 MG tablet Take 1 tablet by mouth daily.    . carvedilol (COREG) 25 MG tablet Take 1 tablet by mouth 2 (two) times daily.     No current facility-administered medications for this visit.     Allergies  Allergen Reactions  . Fentanyl Swelling  . Mirtazapine Swelling  . Penicillins Anaphylaxis  . Sulfa Antibiotics Hives  . Ms Contin  [Morphine] Swelling    Family History  Problem Relation Age of Onset  . Depression Sister   . Anxiety disorder Sister   . Cancer Paternal Uncle        Lung  . Depression Maternal Grandmother   . Heart disease Paternal Grandmother   . Stroke Paternal Grandmother   . Diabetes Paternal Grandmother   . Diabetes Paternal Grandfather     Social History   Socioeconomic History  . Marital status: Married    Spouse name: Not on file  . Number of children: Not on file  . Years of education: Not on file  . Highest education level: Not on file  Occupational History  . Not on file  Social Needs  . Financial resource strain: Not on  file  . Food insecurity:    Worry: Not on file    Inability: Not on file  . Transportation needs:    Medical: Not on file    Non-medical: Not on file  Tobacco Use  . Smoking status: Current Every Day Smoker    Packs/day: 0.50    Types: Cigarettes  . Smokeless tobacco: Never Used  Substance and Sexual Activity  . Alcohol use: Yes    Alcohol/week: 0.0 oz    Comment: occasional  . Drug use: No  . Sexual activity: Yes  Lifestyle  . Physical activity:    Days per week: Not on file    Minutes per session: Not on file  . Stress: Not on file  Relationships  . Social connections:    Talks on phone: Not on file    Gets together: Not on file    Attends religious service: Not on file    Active member of club or organization: Not on file    Attends meetings of clubs or organizations: Not on file    Relationship status: Not on  file  . Intimate partner violence:    Fear of current or ex partner: Not on file    Emotionally abused: Not on file    Physically abused: Not on file    Forced sexual activity: Not on file  Other Topics Concern  . Not on file  Social History Narrative  . Not on file     Constitutional: Denies fever, malaise, fatigue, headache or abrupt weight changes.  HEENT: Pt reports bilateral ear fullness. Denies eye pain, eye redness, ear pain, ringing in the ears, wax buildup, runny nose, nasal congestion, bloody nose, or sore throat. Respiratory: Denies difficulty breathing, shortness of breath, cough or sputum production.   Gastrointestinal: Denies abdominal pain, bloating, constipation, diarrhea or blood in the stool.  GU: Pt reports urinary frequency. Denies urgency, pain with urination, burning sensation, blood in urine, odor or discharge.   No other specific complaints in a complete review of systems (except as listed in HPI above).  Objective:   Physical Exam   BP 110/76   Pulse 78   Temp 98.4 F (36.9 C) (Oral)   Wt 209 lb (94.8 kg)   SpO2 98%   BMI 33.73 kg/m  Wt Readings from Last 3 Encounters:  11/23/17 209 lb (94.8 kg)  02/08/17 214 lb (97.1 kg)  08/29/16 225 lb (102.1 kg)    General: Appears their stated age, well developed, well nourished in NAD. Skin: Warm, dry and intact. No rashes, lesions or ulcerations noted. HEENT: Head: normal shape and size; Eyes: sclera white, no icterus, conjunctiva pink, PERRLA and EOMs intact; Ears: Tm's gray and intact, normal light reflex; Nose: mucosa pink and moist, septum midline; Throat/Mouth: Teeth present, mucosa pink and moist, no exudate, lesions or ulcerations noted.  Neck:  Neck supple, trachea midline. No masses, lumps or thyromegaly present.  Cardiovascular: Normal rate and rhythm. S1,S2 noted.  No murmur, rubs or gallops noted. No JVD or BLE edema. No carotid bruits noted. Pulmonary/Chest: Normal effort and positive vesicular  breath sounds. No respiratory distress. No wheezes, rales or ronchi noted.  Abdomen: Soft and nontender. Normal bowel sounds. No distention or masses noted. Liver, spleen and kidneys non palpable. Musculoskeletal: Normal range of motion. No signs of joint swelling. No difficulty with gait.  Neurological: Alert and oriented. Cranial nerves II-XII grossly intact. Coordination normal.  Psychiatric: Mood and affect normal. Behavior is normal. Judgment and thought  content normal.   EKG:  BMET    Component Value Date/Time   NA 141 02/08/2017 1349   K 4.2 02/08/2017 1349   CL 109 02/08/2017 1349   CO2 27 02/08/2017 1349   GLUCOSE 91 02/08/2017 1349   BUN 7 02/08/2017 1349   CREATININE 1.13 02/08/2017 1349   CALCIUM 9.1 02/08/2017 1349    Lipid Panel     Component Value Date/Time   CHOL 167 02/08/2017 1349   TRIG 154.0 (H) 02/08/2017 1349   HDL 48.10 02/08/2017 1349   CHOLHDL 3 02/08/2017 1349   VLDL 30.8 02/08/2017 1349   LDLCALC 88 02/08/2017 1349    CBC    Component Value Date/Time   WBC 7.1 02/08/2017 1349   RBC 3.87 02/08/2017 1349   HGB 13.3 02/08/2017 1349   HCT 38.9 02/08/2017 1349   PLT 308.0 02/08/2017 1349   MCV 100.7 (H) 02/08/2017 1349   MCHC 34.0 02/08/2017 1349   RDW 13.8 02/08/2017 1349   LYMPHSABS 1.4 07/20/2015 0926   MONOABS 0.5 07/20/2015 0926   EOSABS 0.3 07/20/2015 0926   BASOSABS 0.0 07/20/2015 0926    Hgb A1C Lab Results  Component Value Date   HGBA1C 5.5 02/08/2017           Assessment & Plan:   Urinary Frequency, Mixed Incontinence:  Urinalysis: normal eRx for Myrbetriq 25 mg daily Encouraged timed voiding Will send to urology if no improvement  ETD Bilateral:  Failed Flonase eRx for Pred Taper x 6 days Referral to ENT for further evaluation  Return precautions discussed Nicki ReaperBAITY, Linette Gunderson, NP

## 2017-11-23 NOTE — Patient Instructions (Signed)
Barotitis Media Barotitis media is inflammation of the middle ear. This condition occurs when an auditory tube (eustachian tube) is blocked in one or both ears. These tubes lead from the middle ear to the back of the nose (nasopharynx). This condition typically occurs when you experience changes in pressure, such as when flying or scuba diving. Untreated barotitis media may lead to damage or hearing loss (barotrauma), which may become permanent. What are the causes? This condition may be caused by changes in air pressure from:  Flying.  Scuba diving.  A nearby explosion. What increases the risk? The following factors may make you more likely to develop this condition:  Middle ear infection.  Sinus infection.  A cold.  Environmental allergies.  Small eustachian tubes.  Recent ear surgery. What are the signs or symptoms? Symptoms of this condition may include:  Ear pain.  Hearing loss. In severe cases, symptoms can include:  Dizziness and nausea (vertigo).  Temporary facial paralysis. How is this diagnosed? This condition is diagnosed based on:  A physical exam. Your health care provider may:  Use a device (otoscope) to look into your ear canal and check your eardrum.  Do a test that changes air pressure in the middle ear to check how well the eardrum moves and to see if the eustachian tube is working(tympanogram).  Your medical history. In some cases, your health care provider may have you take a hearing test. You may also be referred to someone who specializes in ear treatment (otolaryngologist, "ENT"). How is this treated? This condition may be treated with:  Medicines to relieve congestion in your nose, sinus, or upper respiratory tract (decongestants).  Techniques to equalize pressure (to "pop" your ears), such as:  Yawning.  Chewing gum.  Swallowing. In severe cases, you may need surgery to relieve your symptoms or to prevent future inflammation. Follow  these instructions at home:  Take over-the-counter and prescription medicines only as told by your health care provider.  Do not put anything into your ears to clean or unplug them. Ear drops will not help.  Keep all follow-up visits as told by your health care provider. This is important. How is this prevented? Using these strategies may help to prevent barotitis media:  Chewing gum with frequent, forceful swallowing during takeoff and landing when flying.  Holding your nose and gently blowing to pop your ears for equalizing pressure changes. This forces air into the eustachian tube.  Yawning during air pressure changes.  Using a nasal decongestant about 30-60 minutes before flying, if you have nasal congestion. Contact a health care provider if:  You have vertigo.  You have hearing loss.  Your symptoms do not get better or they get worse.  You have a fever. Get help right away if:  You have a severe headache, ear pain, and dizziness.  You have balance problems.  You cannot move or feel part of your face.  You have bloody or pus-like drainage from your ears. Summary  Barotitis media is inflammation of the middle ear.  This condition typically occurs when you experience changes in pressure, such as when flying or scuba diving.  You may be at a higher risk for this condition if you have small eustachian tubes, had recent ear surgery, or have allergies, a cold, or sinus or middle ear infection.  This condition may be treated with medicines or techniques to equalize pressure in your ears.  Strategies can be used to help prevent barotitis media. This information is   not intended to replace advice given to you by your health care provider. Make sure you discuss any questions you have with your health care provider. Document Released: 07/28/2000 Document Revised: 06/19/2016 Document Reviewed: 06/19/2016 Elsevier Interactive Patient Education  2017 [employer]  

## 2017-11-28 ENCOUNTER — Ambulatory Visit (INDEPENDENT_AMBULATORY_CARE_PROVIDER_SITE_OTHER): Payer: Medicare Other | Admitting: Psychology

## 2017-11-28 DIAGNOSIS — F4323 Adjustment disorder with mixed anxiety and depressed mood: Secondary | ICD-10-CM

## 2017-12-06 ENCOUNTER — Encounter: Payer: Self-pay | Admitting: Internal Medicine

## 2017-12-09 MED ORDER — QUETIAPINE FUMARATE 50 MG PO TABS: 25.0000 mg | ORAL_TABLET | Freq: Two times a day (BID) | ORAL | 0 refills | Status: DC | PRN

## 2017-12-27 DIAGNOSIS — H6983 Other specified disorders of Eustachian tube, bilateral: Secondary | ICD-10-CM | POA: Diagnosis not present

## 2017-12-27 DIAGNOSIS — J301 Allergic rhinitis due to pollen: Secondary | ICD-10-CM | POA: Diagnosis not present

## 2017-12-27 DIAGNOSIS — H9209 Otalgia, unspecified ear: Secondary | ICD-10-CM | POA: Diagnosis not present

## 2018-01-03 ENCOUNTER — Ambulatory Visit (INDEPENDENT_AMBULATORY_CARE_PROVIDER_SITE_OTHER): Payer: BLUE CROSS/BLUE SHIELD | Admitting: Psychology

## 2018-01-03 DIAGNOSIS — F4323 Adjustment disorder with mixed anxiety and depressed mood: Secondary | ICD-10-CM

## 2018-01-08 ENCOUNTER — Other Ambulatory Visit: Payer: Self-pay | Admitting: Internal Medicine

## 2018-01-10 ENCOUNTER — Ambulatory Visit: Payer: Self-pay | Admitting: Psychology

## 2018-01-10 DIAGNOSIS — R252 Cramp and spasm: Secondary | ICD-10-CM | POA: Diagnosis not present

## 2018-01-10 DIAGNOSIS — I201 Angina pectoris with documented spasm: Secondary | ICD-10-CM | POA: Diagnosis not present

## 2018-01-10 DIAGNOSIS — I2589 Other forms of chronic ischemic heart disease: Secondary | ICD-10-CM | POA: Diagnosis not present

## 2018-01-22 ENCOUNTER — Ambulatory Visit (INDEPENDENT_AMBULATORY_CARE_PROVIDER_SITE_OTHER): Payer: BLUE CROSS/BLUE SHIELD | Admitting: Psychology

## 2018-01-22 DIAGNOSIS — F4323 Adjustment disorder with mixed anxiety and depressed mood: Secondary | ICD-10-CM

## 2018-01-31 ENCOUNTER — Ambulatory Visit (INDEPENDENT_AMBULATORY_CARE_PROVIDER_SITE_OTHER): Payer: BLUE CROSS/BLUE SHIELD | Admitting: Psychology

## 2018-01-31 DIAGNOSIS — F4323 Adjustment disorder with mixed anxiety and depressed mood: Secondary | ICD-10-CM

## 2018-02-07 ENCOUNTER — Ambulatory Visit (INDEPENDENT_AMBULATORY_CARE_PROVIDER_SITE_OTHER): Payer: BLUE CROSS/BLUE SHIELD | Admitting: Psychology

## 2018-02-07 DIAGNOSIS — F4321 Adjustment disorder with depressed mood: Secondary | ICD-10-CM

## 2018-02-12 ENCOUNTER — Ambulatory Visit (INDEPENDENT_AMBULATORY_CARE_PROVIDER_SITE_OTHER): Payer: BLUE CROSS/BLUE SHIELD | Admitting: Psychology

## 2018-02-12 DIAGNOSIS — F4323 Adjustment disorder with mixed anxiety and depressed mood: Secondary | ICD-10-CM | POA: Diagnosis not present

## 2018-02-28 ENCOUNTER — Ambulatory Visit: Payer: Self-pay | Admitting: Psychology

## 2018-03-05 ENCOUNTER — Other Ambulatory Visit: Payer: Self-pay | Admitting: Internal Medicine

## 2018-03-05 DIAGNOSIS — R252 Cramp and spasm: Secondary | ICD-10-CM

## 2018-03-06 NOTE — Telephone Encounter (Signed)
Last filled 07/2017 w/ 5 refills... Please advise if okay to refill

## 2018-03-08 ENCOUNTER — Ambulatory Visit (INDEPENDENT_AMBULATORY_CARE_PROVIDER_SITE_OTHER): Payer: BLUE CROSS/BLUE SHIELD | Admitting: Psychology

## 2018-03-08 DIAGNOSIS — F4323 Adjustment disorder with mixed anxiety and depressed mood: Secondary | ICD-10-CM

## 2018-03-22 ENCOUNTER — Other Ambulatory Visit: Payer: Self-pay | Admitting: Internal Medicine

## 2018-03-22 ENCOUNTER — Ambulatory Visit (INDEPENDENT_AMBULATORY_CARE_PROVIDER_SITE_OTHER): Payer: BLUE CROSS/BLUE SHIELD | Admitting: Psychology

## 2018-03-22 DIAGNOSIS — F4323 Adjustment disorder with mixed anxiety and depressed mood: Secondary | ICD-10-CM | POA: Diagnosis not present

## 2018-04-03 ENCOUNTER — Other Ambulatory Visit: Payer: Self-pay | Admitting: Family Medicine

## 2018-04-03 NOTE — Telephone Encounter (Signed)
Please see refills request.

## 2018-04-22 ENCOUNTER — Encounter: Payer: Self-pay | Admitting: Internal Medicine

## 2018-05-09 ENCOUNTER — Other Ambulatory Visit: Payer: Self-pay | Admitting: Internal Medicine

## 2018-05-16 ENCOUNTER — Other Ambulatory Visit: Payer: Self-pay | Admitting: Internal Medicine

## 2018-05-21 ENCOUNTER — Other Ambulatory Visit: Payer: Self-pay | Admitting: Internal Medicine

## 2018-06-12 ENCOUNTER — Telehealth: Payer: Self-pay | Admitting: Internal Medicine

## 2018-06-12 NOTE — Telephone Encounter (Signed)
Pt husband called to get physical scheduled for insurance purposes. He said it need to done by 06/21/18. Can pt get worked in? Last cpe 01/2017.

## 2018-06-12 NOTE — Telephone Encounter (Signed)
We have been calling her and sending her emails for a few months now that she needed to schedule a physical. Now it's down to the last minute and she wants me to work her in. I'm not going to do that. She can take the next available.

## 2018-06-13 NOTE — Telephone Encounter (Signed)
I spoke with spouse Rusty per dpr. I offered 11/22 but he said they will have to get this done earlier and will have to go elsewhere.

## 2018-07-03 DIAGNOSIS — E7800 Pure hypercholesterolemia, unspecified: Secondary | ICD-10-CM | POA: Diagnosis not present

## 2018-07-03 DIAGNOSIS — Z131 Encounter for screening for diabetes mellitus: Secondary | ICD-10-CM | POA: Diagnosis not present

## 2018-07-06 ENCOUNTER — Encounter: Payer: Self-pay | Admitting: Internal Medicine

## 2018-07-08 ENCOUNTER — Ambulatory Visit (INDEPENDENT_AMBULATORY_CARE_PROVIDER_SITE_OTHER): Payer: BLUE CROSS/BLUE SHIELD | Admitting: Internal Medicine

## 2018-07-08 ENCOUNTER — Encounter: Payer: Self-pay | Admitting: Internal Medicine

## 2018-07-08 VITALS — BP 116/78 | HR 76 | Temp 97.9°F | Wt 175.0 lb

## 2018-07-08 DIAGNOSIS — G2581 Restless legs syndrome: Secondary | ICD-10-CM

## 2018-07-08 DIAGNOSIS — R109 Unspecified abdominal pain: Secondary | ICD-10-CM | POA: Diagnosis not present

## 2018-07-08 DIAGNOSIS — Z8051 Family history of malignant neoplasm of kidney: Secondary | ICD-10-CM

## 2018-07-08 DIAGNOSIS — R252 Cramp and spasm: Secondary | ICD-10-CM | POA: Diagnosis not present

## 2018-07-08 LAB — BASIC METABOLIC PANEL
BUN: 11 mg/dL (ref 6–23)
CO2: 24 meq/L (ref 19–32)
Calcium: 8.9 mg/dL (ref 8.4–10.5)
Chloride: 110 mEq/L (ref 96–112)
Creatinine, Ser: 1.16 mg/dL (ref 0.40–1.20)
GFR: 54.42 mL/min — AB (ref >60.00)
GLUCOSE: 115 mg/dL — AB (ref 70–99)
Potassium: 4.3 mEq/L (ref 3.5–5.1)
SODIUM: 141 meq/L (ref 135–145)

## 2018-07-08 LAB — MAGNESIUM: Magnesium: 2.1 mg/dL (ref 1.5–2.5)

## 2018-07-08 NOTE — Patient Instructions (Signed)

## 2018-07-08 NOTE — Progress Notes (Signed)
Subjective:    Patient ID: Dana Miller, female    DOB: 1976/04/21, 42 y.o.   MRN: [id number]  HPI  Pt presents to the clinic today with c/o cramping in her legs. She noticed this 1 year ago. She reports it is intermittent. The right seems worse than the left. She is concerned but it recently worsened and is occurring more frequently. It seems worse. She averages  48ounces of water daily. She does have RLS currently managed on Pramipexole. She feels like this is different than her RLS.  She also reports that they recently found a tumor on her fathers kidney. It was cancerous. He had ot have a nephrectomy. The cancer has metastasized. This is concerning for her. She would like her kidney function checked today.  Review of Systems  Past Medical History:  Diagnosis Date  . Anxiety   . Cardiac microvascular disease   . Chicken pox   . Depression   . Thyroid mass    right side    Current Outpatient Medications  Medication Sig Dispense Refill  . aspirin EC 81 MG tablet Take 1 tablet by mouth daily.    Marland Kitchen atorvastatin (LIPITOR) 20 MG tablet Take 1 tablet by mouth daily.  11  . busPIRone (BUSPAR) 5 MG tablet TAKE 1 TABLET(5 MG) BY MOUTH TWICE DAILY 180 tablet 0  . EPINEPHrine 0.3 mg/0.3 mL IJ SOAJ injection Inject 0.3 mLs (0.3 mg total) into the muscle as needed. For Anaphylaxis 1 Device 0  . fexofenadine (ALLEGRA) 180 MG tablet Take 1 tablet by mouth daily.    . mirabegron ER (MYRBETRIQ) 25 MG TB24 tablet Take 1 tablet (25 mg total) by mouth daily. 30 tablet 2  . nitroGLYCERIN (NITROSTAT) 0.4 MG SL tablet Place 0.4 mg under the tongue every 5 (five) minutes as needed for chest pain.    . pramipexole (MIRAPEX) 0.5 MG tablet Take 1 tablet (0.5 mg total) by mouth at bedtime. MUST SCHEDULE ANNUAL PHYSICAL EXAM 30 tablet 5  . predniSONE (DELTASONE) 10 MG tablet Take 6 tabs day 1, 5 tabs day 2, 4 tabs day 3, 3 tabs day 4, 2 tabs day 5, 1 tab day 6 21 tablet 0  . QUEtiapine (SEROQUEL) 50 MG  tablet TAKE 1/2 TO 1 TABLET(25 TO 50 MG) BY MOUTH TWICE DAILY AS NEEDED 180 tablet 0  . Selenium Sulfide 2.25 % SHAM Apply 1 application topically daily as needed. 1 Bottle 3  . topiramate (TOPAMAX) 100 MG tablet TAKE ONE TABLET BY MOUTH DAILY 30 tablet 5  . venlafaxine XR (EFFEXOR-XR) 75 MG 24 hr capsule Take 1 capsule (75 mg total) by mouth daily with breakfast. 270 capsule 0  . amLODipine (NORVASC) 10 MG tablet Take 1 tablet by mouth daily.    . carvedilol (COREG) 25 MG tablet Take 1 tablet by mouth 2 (two) times daily.     No current facility-administered medications for this visit.     Allergies  Allergen Reactions  . Fentanyl Swelling  . Mirtazapine Swelling  . Penicillins Anaphylaxis  . Sulfa Antibiotics Hives  . Ms Contin  [Morphine] Swelling    Family History  Problem Relation Age of Onset  . Depression Sister   . Anxiety disorder Sister   . Cancer Paternal Uncle        Lung  . Depression Maternal Grandmother   . Heart disease Paternal Grandmother   . Stroke Paternal Grandmother   . Diabetes Paternal Grandmother   . Diabetes Paternal Grandfather  Social History   Socioeconomic History  . Marital status: Married    Spouse name: Not on file  . Number of children: Not on file  . Years of education: Not on file  . Highest education level: Not on file  Occupational History  . Not on file  Social Needs  . Financial resource strain: Not on file  . Food insecurity:    Worry: Not on file    Inability: Not on file  . Transportation needs:    Medical: Not on file    Non-medical: Not on file  Tobacco Use  . Smoking status: Current Every Day Smoker    Packs/day: 0.50    Types: Cigarettes  . Smokeless tobacco: Never Used  Substance and Sexual Activity  . Alcohol use: Yes    Alcohol/week: 0.0 standard drinks    Comment: occasional  . Drug use: No  . Sexual activity: Yes  Lifestyle  . Physical activity:    Days per week: Not on file    Minutes per session:  Not on file  . Stress: Not on file  Relationships  . Social connections:    Talks on phone: Not on file    Gets together: Not on file    Attends religious service: Not on file    Active member of club or organization: Not on file    Attends meetings of clubs or organizations: Not on file    Relationship status: Not on file  . Intimate partner violence:    Fear of current or ex partner: Not on file    Emotionally abused: Not on file    Physically abused: Not on file    Forced sexual activity: Not on file  Other Topics Concern  . Not on file  Social History Narrative  . Not on file     Constitutional: Denies fever, malaise, fatigue, headache or abrupt weight changes.  GU: Pt reports bilateral flank pain. Denies urgency, frequency, pain with urination, burning sensation, blood in urine, odor or discharge. Musculoskeletal: Pt reports leg cramping. Denies decrease in range of motion, difficulty with gait, or joint pain and swelling.  Skin: Denies redness, rashes, lesions or ulcercations.  Neurological: Denies dizziness, difficulty with memory, difficulty with speech or problems with balance and coordination.    No other specific complaints in a complete review of systems (except as listed in HPI above).     Objective:   Physical Exam   BP 116/78   Pulse 76   Temp 97.9 F (36.6 C) (Oral)   Wt 175 lb (79.4 kg)   LMP 07/01/2018   SpO2 97%   BMI 28.25 kg/m  Wt Readings from Last 3 Encounters:  07/08/18 175 lb (79.4 kg)  11/23/17 209 lb (94.8 kg)  02/08/17 214 lb (97.1 kg)    General: Appears her stated age,  in NAD. Skin: Warm, dry and intact. No rashes, lesions or ulcerations noted. Cardiovascular: Normal rate and rhythm. S1,S2 noted.  No murmur, rubs or gallops noted. No JVD or BLE edema. No carotid bruits noted. Pulmonary/Chest: Normal effort and positive vesicular breath sounds. No respiratory distress. Abdomen: Soft and nontender. Normal bowel sounds. No distention  or masses noted. Liver, spleen and kidneys non palpable. No CVA tenderness noted. Musculoskeletal: No pain with palpation of the calves. Negative Homan's. No calf swelling noted. Strength 5/5 BUE/BLE. No difficulty with gait.  Neurological: Alert and oriented. Sensation intact to BLE.   BMET    Component Value Date/Time   NA  141 02/08/2017 1349   K 4.2 02/08/2017 1349   CL 109 02/08/2017 1349   CO2 27 02/08/2017 1349   GLUCOSE 91 02/08/2017 1349   BUN 7 02/08/2017 1349   CREATININE 1.13 02/08/2017 1349   CALCIUM 9.1 02/08/2017 1349    Lipid Panel     Component Value Date/Time   CHOL 167 02/08/2017 1349   TRIG 154.0 (H) 02/08/2017 1349   HDL 48.10 02/08/2017 1349   CHOLHDL 3 02/08/2017 1349   VLDL 30.8 02/08/2017 1349   LDLCALC 88 02/08/2017 1349    CBC    Component Value Date/Time   WBC 7.1 02/08/2017 1349   RBC 3.87 02/08/2017 1349   HGB 13.3 02/08/2017 1349   HCT 38.9 02/08/2017 1349   PLT 308.0 02/08/2017 1349   MCV 100.7 (H) 02/08/2017 1349   MCHC 34.0 02/08/2017 1349   RDW 13.8 02/08/2017 1349   LYMPHSABS 1.4 07/20/2015 0926   MONOABS 0.5 07/20/2015 0926   EOSABS 0.3 07/20/2015 0926   BASOSABS 0.0 07/20/2015 0926    Hgb A1C Lab Results  Component Value Date   HGBA1C 5.5 02/08/2017           Assessment & Plan:   Muscle Cramps of Legs, RLS:  BMET, Magnesium level today Consider adding Magnesium 400 mg QHS Consider increase Pramipexole to BID Encouraged stretching She seems to have adequate water intake  Bilateral Flank Pain, Family history of Kidney Cancer:  BMET today Renal ultrasound  Will follow up after labs and ultrasound, return precautions discussed Nicki Reaper, NP

## 2018-07-10 ENCOUNTER — Other Ambulatory Visit: Payer: Self-pay

## 2018-07-18 ENCOUNTER — Ambulatory Visit (INDEPENDENT_AMBULATORY_CARE_PROVIDER_SITE_OTHER)
Admission: RE | Admit: 2018-07-18 | Discharge: 2018-07-18 | Disposition: A | Discharge status: Home / Self Care | Payer: BLUE CROSS/BLUE SHIELD | Source: Ambulatory Visit | Attending: Internal Medicine | Admitting: Internal Medicine

## 2018-07-18 ENCOUNTER — Ambulatory Visit (INDEPENDENT_AMBULATORY_CARE_PROVIDER_SITE_OTHER): Payer: BLUE CROSS/BLUE SHIELD | Admitting: Internal Medicine

## 2018-07-18 ENCOUNTER — Encounter: Payer: Self-pay | Admitting: Internal Medicine

## 2018-07-18 VITALS — BP 116/72 | HR 74 | Temp 98.0°F | Wt 169.0 lb

## 2018-07-18 DIAGNOSIS — M7989 Other specified soft tissue disorders: Secondary | ICD-10-CM

## 2018-07-18 DIAGNOSIS — M79674 Pain in right toe(s): Secondary | ICD-10-CM

## 2018-07-18 DIAGNOSIS — M79671 Pain in right foot: Secondary | ICD-10-CM | POA: Diagnosis not present

## 2018-07-18 MED ORDER — PREDNISONE 10 MG PO TABS: ORAL_TABLET | ORAL | 0 refills | Status: DC

## 2018-07-18 NOTE — Progress Notes (Signed)
Subjective:    Patient ID: Dana Miller, female    DOB: [birth year], 42 y.o.   MRN: [id number]  HPI  Pt presents to the clinic today with c/o right foot pain. She reports this started 3-4 days ago. She describes the pain as sharp, throbbing. The pain does not radiate. She denies numbness, tingling or weakness. She has noticed redness, swelling and warmth. She denies any injury to the area. She has tried ice with minimal relief. She has no history of gout.  Review of Systems      Past Medical History:  Diagnosis Date  . Anxiety   . Cardiac microvascular disease   . Chicken pox   . Depression   . Thyroid mass    right side    Current Outpatient Medications  Medication Sig Dispense Refill  . aspirin EC 81 MG tablet Take 1 tablet by mouth daily.    Marland Kitchen atorvastatin (LIPITOR) 20 MG tablet Take 1 tablet by mouth daily.  11  . busPIRone (BUSPAR) 5 MG tablet TAKE 1 TABLET(5 MG) BY MOUTH TWICE DAILY 180 tablet 0  . EPINEPHrine 0.3 mg/0.3 mL IJ SOAJ injection Inject 0.3 mLs (0.3 mg total) into the muscle as needed. For Anaphylaxis 1 Device 0  . fexofenadine (ALLEGRA) 180 MG tablet Take 1 tablet by mouth daily.    . mirabegron ER (MYRBETRIQ) 25 MG TB24 tablet Take 1 tablet (25 mg total) by mouth daily. 30 tablet 2  . nitroGLYCERIN (NITROSTAT) 0.4 MG SL tablet Place 0.4 mg under the tongue every 5 (five) minutes as needed for chest pain.    . pramipexole (MIRAPEX) 0.5 MG tablet Take 1 tablet (0.5 mg total) by mouth at bedtime. MUST SCHEDULE ANNUAL PHYSICAL EXAM 30 tablet 5  . QUEtiapine (SEROQUEL) 50 MG tablet TAKE 1/2 TO 1 TABLET(25 TO 50 MG) BY MOUTH TWICE DAILY AS NEEDED 180 tablet 0  . ranolazine (RANEXA) 500 MG 12 hr tablet Take 2 tablets by mouth daily.  3  . Selenium Sulfide 2.25 % SHAM Apply 1 application topically daily as needed. 1 Bottle 3  . topiramate (TOPAMAX) 100 MG tablet TAKE ONE TABLET BY MOUTH DAILY 30 tablet 5  . venlafaxine XR (EFFEXOR-XR) 75 MG 24 hr capsule Take 1  capsule (75 mg total) by mouth daily with breakfast. 270 capsule 0  . amLODipine (NORVASC) 10 MG tablet Take 1 tablet by mouth daily.    . carvedilol (COREG) 25 MG tablet Take 1 tablet by mouth 2 (two) times daily.     No current facility-administered medications for this visit.     Allergies  Allergen Reactions  . Fentanyl Swelling  . Mirtazapine Swelling  . Penicillins Anaphylaxis  . Sulfa Antibiotics Hives  . Ms Contin  [Morphine] Swelling    Family History  Problem Relation Age of Onset  . Depression Sister   . Anxiety disorder Sister   . Cancer Paternal Uncle        Lung  . Depression Maternal Grandmother   . Heart disease Paternal Grandmother   . Stroke Paternal Grandmother   . Diabetes Paternal Grandmother   . Diabetes Paternal Grandfather     Social History   Socioeconomic History  . Marital status: Married    Spouse name: Not on file  . Number of children: Not on file  . Years of education: Not on file  . Highest education level: Not on file  Occupational History  . Not on file  Social Needs  . Physicist, medical  strain: Not on file  . Food insecurity:    Worry: Not on file    Inability: Not on file  . Transportation needs:    Medical: Not on file    Non-medical: Not on file  Tobacco Use  . Smoking status: Current Every Day Smoker    Packs/day: 0.50    Types: Cigarettes  . Smokeless tobacco: Never Used  Substance and Sexual Activity  . Alcohol use: Yes    Alcohol/week: 0.0 standard drinks    Comment: occasional  . Drug use: No  . Sexual activity: Yes  Lifestyle  . Physical activity:    Days per week: Not on file    Minutes per session: Not on file  . Stress: Not on file  Relationships  . Social connections:    Talks on phone: Not on file    Gets together: Not on file    Attends religious service: Not on file    Active member of club or organization: Not on file    Attends meetings of clubs or organizations: Not on file    Relationship  status: Not on file  . Intimate partner violence:    Fear of current or ex partner: Not on file    Emotionally abused: Not on file    Physically abused: Not on file    Forced sexual activity: Not on file  Other Topics Concern  . Not on file  Social History Narrative  . Not on file     Constitutional: Denies fever, malaise, fatigue, headache or abrupt weight changes.  Musculoskeletal: Pt reports right foot pain and swelling. Denies decrease in range of motion, difficulty with gait, muscle pain.  Skin: Pt reports redness and warmth at base of big toe, right foot. Denies rashes, lesions or ulcercations.    No other specific complaints in a complete review of systems (except as listed in HPI above).  Objective:   Physical Exam   BP 116/72   Pulse 74   Temp 98 F (36.7 C) (Oral)   Wt 169 lb (76.7 kg)   LMP 07/01/2018   SpO2 98%   BMI 27.28 kg/m  Wt Readings from Last 3 Encounters:  07/18/18 169 lb (76.7 kg)  07/08/18 175 lb (79.4 kg)  11/23/17 209 lb (94.8 kg)    General: Appears her stated age, well developed, well nourished in NAD. Skin: Warm, dry and intact. Redness and warmth noted at base of big toe. Musculoskeletal: Pain with flexion and extension of the right great toe. Mild swelling noted. Tender to palpation at base and medial side of right great toe. Limps with normal gait. Neurological: Alert and oriented.   BMET    Component Value Date/Time   NA 141 07/08/2018 1212   K 4.3 07/08/2018 1212   CL 110 07/08/2018 1212   CO2 24 07/08/2018 1212   GLUCOSE 115 (H) 07/08/2018 1212   BUN 11 07/08/2018 1212   CREATININE 1.16 07/08/2018 1212   CALCIUM 8.9 07/08/2018 1212    Lipid Panel     Component Value Date/Time   CHOL 167 02/08/2017 1349   TRIG 154.0 (H) 02/08/2017 1349   HDL 48.10 02/08/2017 1349   CHOLHDL 3 02/08/2017 1349   VLDL 30.8 02/08/2017 1349   LDLCALC 88 02/08/2017 1349    CBC    Component Value Date/Time   WBC 7.1 02/08/2017 1349   RBC  3.87 02/08/2017 1349   HGB 13.3 02/08/2017 1349   HCT 38.9 02/08/2017 1349   PLT  308.0 02/08/2017 1349   MCV 100.7 (H) 02/08/2017 1349   MCHC 34.0 02/08/2017 1349   RDW 13.8 02/08/2017 1349   LYMPHSABS 1.4 07/20/2015 0926   MONOABS 0.5 07/20/2015 0926   EOSABS 0.3 07/20/2015 0926   BASOSABS 0.0 07/20/2015 0926    Hgb A1C Lab Results  Component Value Date   HGBA1C 5.5 02/08/2017           Assessment & Plan:   Right Foot Pain and Swelling:  Gout vs inflammatory arthritis, vs stress fracture Xray right foot today RX for Pred Taper x 6 days, avoid OTC NSAID's  Will follow up after xray, return precautions discussed Nicki Reaper, NP

## 2018-07-18 NOTE — Patient Instructions (Signed)
Foot Pain Many things can cause foot pain. Some common causes are:  An injury.  A sprain.  Arthritis.  Blisters.  Bunions.  Follow these instructions at home: Pay attention to any changes in your symptoms. Take these actions to help with your discomfort:  If directed, put ice on the affected area: ? Put ice in a plastic bag. ? Place a towel between your skin and the bag. ? Leave the ice on for 15-20 minutes, 3?4 times a day for 2 days.  Take over-the-counter and prescription medicines only as told by your health care provider.  Wear comfortable, supportive shoes that fit you well. Do not wear high heels.  Do not stand or walk for long periods of time.  Do not lift a lot of weight. This can put added pressure on your feet.  Do stretches to relieve foot pain and stiffness as told by your health care provider.  Rub your foot gently.  Keep your feet clean and dry.  Contact a health care provider if:  Your pain does not get better after a few days of self-care.  Your pain gets worse.  You cannot stand on your foot. Get help right away if:  Your foot is numb or tingling.  Your foot or toes are swollen.  Your foot or toes turn white or blue.  You have warmth and redness along your foot. This information is not intended to replace advice given to you by your health care provider. Make sure you discuss any questions you have with your health care provider. Document Released: 08/27/2015 Document Revised: 01/06/2016 Document Reviewed: 08/26/2014 Elsevier Interactive Patient Education  2018 [employer]  

## 2018-08-12 ENCOUNTER — Other Ambulatory Visit: Payer: Self-pay | Admitting: Internal Medicine

## 2018-08-13 MED ORDER — BUSPIRONE HCL 5 MG PO TABS: 5.0000 mg | ORAL_TABLET | Freq: Two times a day (BID) | ORAL | 0 refills | Status: DC

## 2018-08-13 MED ORDER — VENLAFAXINE HCL ER 75 MG PO CP24: 75.0000 mg | ORAL_CAPSULE | Freq: Every day | ORAL | 0 refills | Status: DC

## 2018-08-16 ENCOUNTER — Telehealth: Payer: Self-pay

## 2018-08-16 NOTE — Telephone Encounter (Signed)
Left message for patient to call back to follow up on missed US Renal in November.

## 2018-08-30 NOTE — Telephone Encounter (Signed)
Left message for patient to follow up and sending mychart message

## 2018-11-22 ENCOUNTER — Encounter: Payer: Self-pay | Admitting: Internal Medicine

## 2018-11-24 MED ORDER — VENLAFAXINE HCL ER 75 MG PO CP24: 225.0000 mg | ORAL_CAPSULE | Freq: Every day | ORAL | 0 refills | Status: DC

## 2018-12-05 ENCOUNTER — Encounter: Payer: Self-pay | Admitting: Internal Medicine

## 2018-12-06 ENCOUNTER — Emergency Department (HOSPITAL_COMMUNITY)
Admission: EM | Admit: 2018-12-06 | Discharge: 2018-12-06 | Disposition: A | Discharge status: Home / Self Care | Payer: BLUE CROSS/BLUE SHIELD | Attending: Emergency Medicine | Admitting: Emergency Medicine

## 2018-12-06 ENCOUNTER — Encounter (HOSPITAL_COMMUNITY): Payer: Self-pay | Admitting: *Deleted

## 2018-12-06 ENCOUNTER — Ambulatory Visit: Payer: Medicare Other | Admitting: Internal Medicine

## 2018-12-06 ENCOUNTER — Other Ambulatory Visit: Payer: Self-pay

## 2018-12-06 DIAGNOSIS — H538 Other visual disturbances: Secondary | ICD-10-CM | POA: Insufficient documentation

## 2018-12-06 DIAGNOSIS — Z5321 Procedure and treatment not carried out due to patient leaving prior to being seen by health care provider: Secondary | ICD-10-CM | POA: Diagnosis not present

## 2018-12-06 DIAGNOSIS — R5383 Other fatigue: Secondary | ICD-10-CM | POA: Diagnosis not present

## 2018-12-06 NOTE — ED Triage Notes (Signed)
The pt is c?o blurred vision in her rt eye for one month  She had blurred vision in her lt  Eye initially  THAT WENT AWAY  LAST NIGHT HER RT EYE VISION WAS WORSE.  FATIQUE MUSCLE WEAKNESS  SHE WAS TOLD TO COME BY HER DOCTOR TODAY  LMP April 11TH

## 2018-12-09 ENCOUNTER — Ambulatory Visit: Payer: Self-pay | Admitting: Internal Medicine

## 2018-12-09 NOTE — Progress Notes (Signed)
Erroneous encounter

## 2018-12-12 ENCOUNTER — Ambulatory Visit (INDEPENDENT_AMBULATORY_CARE_PROVIDER_SITE_OTHER): Payer: BC Managed Care – PPO | Admitting: Internal Medicine

## 2018-12-12 ENCOUNTER — Encounter: Payer: Self-pay | Admitting: Internal Medicine

## 2018-12-12 ENCOUNTER — Other Ambulatory Visit: Payer: Self-pay

## 2018-12-12 VITALS — BP 106/64 | HR 76 | Temp 98.1°F | Wt 167.0 lb

## 2018-12-12 DIAGNOSIS — R5383 Other fatigue: Secondary | ICD-10-CM | POA: Diagnosis not present

## 2018-12-12 DIAGNOSIS — R42 Dizziness and giddiness: Secondary | ICD-10-CM

## 2018-12-12 DIAGNOSIS — R531 Weakness: Secondary | ICD-10-CM | POA: Diagnosis not present

## 2018-12-12 LAB — COMPREHENSIVE METABOLIC PANEL
ALT: 7 U/L (ref 0–35)
AST: 11 U/L (ref 0–37)
Albumin: 4.1 g/dL (ref 3.5–5.2)
Alkaline Phosphatase: 56 U/L (ref 39–117)
BUN: 12 mg/dL (ref 6–23)
CO2: 26 mEq/L (ref 19–32)
Calcium: 8.8 mg/dL (ref 8.4–10.5)
Chloride: 109 mEq/L (ref 96–112)
Creatinine, Ser: 1.13 mg/dL (ref 0.40–1.20)
GFR: 52.66 mL/min — ABNORMAL LOW (ref >60.00)
Glucose, Bld: 61 mg/dL — ABNORMAL LOW (ref 70–99)
Potassium: 4.3 mEq/L (ref 3.5–5.1)
Sodium: 142 mEq/L (ref 135–145)
Total Bilirubin: 0.3 mg/dL (ref 0.2–1.2)
Total Protein: 6.5 g/dL (ref 6.0–8.3)

## 2018-12-12 LAB — CBC
HCT: 39.3 % (ref 36.0–46.0)
Hemoglobin: 13.4 g/dL (ref 12.0–15.0)
MCHC: 34.2 g/dL (ref 30.0–36.0)
MCV: 102.2 fl — ABNORMAL HIGH (ref 78.0–100.0)
Platelets: 288 10*3/uL (ref 150.0–400.0)
RBC: 3.84 Mil/uL — ABNORMAL LOW (ref 3.87–5.11)
RDW: 13.6 % (ref 11.5–15.5)
WBC: 9 10*3/uL (ref 4.0–10.5)

## 2018-12-12 LAB — LIPID PANEL
Cholesterol: 152 mg/dL (ref 0–200)
HDL: 60.1 mg/dL (ref >39.00)
LDL Cholesterol: 62 mg/dL (ref 0–99)
NonHDL: 91.48
Total CHOL/HDL Ratio: 3
Triglycerides: 149 mg/dL (ref 0.0–149.0)
VLDL: 29.8 mg/dL (ref 0.0–40.0)

## 2018-12-12 LAB — TSH: TSH: 1.11 u[IU]/mL (ref 0.35–4.50)

## 2018-12-12 LAB — HEMOGLOBIN A1C: Hgb A1c MFr Bld: 5.4 % (ref 4.6–6.5)

## 2018-12-12 LAB — FOLATE: Folate: 18.5 ng/mL (ref >5.9)

## 2018-12-12 LAB — VITAMIN B12: Vitamin B-12: 195 pg/mL — ABNORMAL LOW (ref 211–911)

## 2018-12-12 LAB — VITAMIN D 25 HYDROXY (VIT D DEFICIENCY, FRACTURES): VITD: 29.51 ng/mL — ABNORMAL LOW (ref 30.00–100.00)

## 2018-12-12 LAB — HIGH SENSITIVITY CRP: CRP, High Sensitivity: 0.71 mg/L (ref 0.000–5.000)

## 2018-12-12 LAB — SEDIMENTATION RATE: Sed Rate: 6 mm/hr (ref 0–20)

## 2018-12-12 NOTE — Patient Instructions (Signed)

## 2018-12-12 NOTE — Progress Notes (Signed)
Subjective:    Patient ID: Dana Miller, female    DOB: 1976/03/27, 43 y.o.   MRN: [id number]  HPI  Pt presents to the clinic today with c/o dizziness, fatigue and weakness. She reports this started 1 month ago. She describes the dizziness as as sense of imbalance. She has had one episode of blurred vision about 1 month ago, none since. She is fatigued, her muscles feel weak, mostly in her lower extremities. She feels cold all the time even when it's hot outside. She feels like she is confused at times and often has trouble concentrating. She has noticed that she is having an extra menstrual cycle about every 2 months. All of this is causing her extreme anxiety. She is under a lot of stress during this pandemic, her dad is undergoing cancer treatment, her husband is working from home, her kids are remote learning from home. She is taking her Effexor and Buspar as prescribed. She is not seeing a therapist.  Review of Systems      Past Medical History:  Diagnosis Date  . Anxiety   . Cardiac microvascular disease   . Chicken pox   . Depression   . Thyroid mass    right side    Current Outpatient Medications  Medication Sig Dispense Refill  . aspirin EC 81 MG tablet Take 1 tablet by mouth daily.    Marland Kitchen atorvastatin (LIPITOR) 20 MG tablet Take 1 tablet by mouth daily.  11  . busPIRone (BUSPAR) 5 MG tablet Take 1 tablet (5 mg total) by mouth 2 (two) times daily. 180 tablet 0  . EPINEPHrine 0.3 mg/0.3 mL IJ SOAJ injection Inject 0.3 mLs (0.3 mg total) into the muscle as needed. For Anaphylaxis 1 Device 0  . fexofenadine (ALLEGRA) 180 MG tablet Take 1 tablet by mouth daily.    . mirabegron ER (MYRBETRIQ) 25 MG TB24 tablet Take 1 tablet (25 mg total) by mouth daily. 30 tablet 2  . nitroGLYCERIN (NITROSTAT) 0.4 MG SL tablet Place 0.4 mg under the tongue every 5 (five) minutes as needed for chest pain.    . pramipexole (MIRAPEX) 0.5 MG tablet Take 1 tablet (0.5 mg total) by mouth at bedtime.  MUST SCHEDULE ANNUAL PHYSICAL EXAM 30 tablet 5  . QUEtiapine (SEROQUEL) 50 MG tablet TAKE 1/2 TO 1 TABLET(25 TO 50 MG) BY MOUTH TWICE DAILY AS NEEDED 180 tablet 0  . ranolazine (RANEXA) 500 MG 12 hr tablet Take 2 tablets by mouth daily.  3  . Selenium Sulfide 2.25 % SHAM Apply 1 application topically daily as needed. 1 Bottle 3  . topiramate (TOPAMAX) 100 MG tablet TAKE ONE TABLET BY MOUTH DAILY 30 tablet 5  . venlafaxine XR (EFFEXOR-XR) 75 MG 24 hr capsule Take 3 capsules (225 mg total) by mouth daily with breakfast. 270 capsule 0  . amLODipine (NORVASC) 10 MG tablet Take 1 tablet by mouth daily.    . carvedilol (COREG) 25 MG tablet Take 1 tablet by mouth 2 (two) times daily.     No current facility-administered medications for this visit.     Allergies  Allergen Reactions  . Fentanyl Swelling  . Mirtazapine Swelling  . Penicillins Anaphylaxis  . Sulfa Antibiotics Hives  . Ms Contin  [Morphine] Swelling    Family History  Problem Relation Age of Onset  . Depression Sister   . Anxiety disorder Sister   . Cancer Paternal Uncle        Lung  . Depression Maternal Grandmother   .  Heart disease Paternal Grandmother   . Stroke Paternal Grandmother   . Diabetes Paternal Grandmother   . Diabetes Paternal Grandfather     Social History   Socioeconomic History  . Marital status: Married    Spouse name: Not on file  . Number of children: Not on file  . Years of education: Not on file  . Highest education level: Not on file  Occupational History  . Not on file  Social Needs  . Financial resource strain: Not on file  . Food insecurity:    Worry: Not on file    Inability: Not on file  . Transportation needs:    Medical: Not on file    Non-medical: Not on file  Tobacco Use  . Smoking status: Current Every Day Smoker    Packs/day: 0.50    Types: Cigarettes  . Smokeless tobacco: Never Used  Substance and Sexual Activity  . Alcohol use: Yes    Alcohol/week: 0.0 standard  drinks    Comment: occasional  . Drug use: No  . Sexual activity: Yes  Lifestyle  . Physical activity:    Days per week: Not on file    Minutes per session: Not on file  . Stress: Not on file  Relationships  . Social connections:    Talks on phone: Not on file    Gets together: Not on file    Attends religious service: Not on file    Active member of club or organization: Not on file    Attends meetings of clubs or organizations: Not on file    Relationship status: Not on file  . Intimate partner violence:    Fear of current or ex partner: Not on file    Emotionally abused: Not on file    Physically abused: Not on file    Forced sexual activity: Not on file  Other Topics Concern  . Not on file  Social History Narrative  . Not on file     Constitutional: Pt reports fatigue, intermittent headaches. Denies fever, malaise, or abrupt weight changes.  HEENT: Pt reports intermittent visual changes. Denies eye pain, eye redness, ear pain, ringing in the ears, wax buildup, runny nose, nasal congestion, bloody nose, or sore throat. Respiratory: Denies difficulty breathing, shortness of breath, cough or sputum production.   Cardiovascular: Denies chest pain, chest tightness, palpitations or swelling in the hands or feet.  Gastrointestinal: Pt reports loose stools. Denies abdominal pain, bloating, constipation, diarrhea or blood in the stool.  GU: Pt reports urinary frequency. Denies urgency, pain with urination, burning sensation, blood in urine, odor or discharge. Musculoskeletal: Pt reports generalized weakness, muscle pain. Denies decrease in range of motion, difficulty with gait, or joint pain and swelling.  Skin: Denies redness, rashes, lesions or ulcercations.  Neurological: Pt reports dizziness. Denies difficulty with memory, difficulty with speech or problems with balance and coordination.  Psych: Pt reports anxiety. Denies  depression, SI/HI.  No other specific complaints in a  complete review of systems (except as listed in HPI above).  Objective:   Physical Exam  BP 106/64   Pulse 76   Temp 98.1 F (36.7 C) (Oral)   Wt 167 lb (75.8 kg)   LMP 11/24/2018   SpO2 98%   BMI 26.55 kg/m  Wt Readings from Last 3 Encounters:  12/12/18 167 lb (75.8 kg)  12/06/18 161 lb (73 kg)  07/18/18 169 lb (76.7 kg)    General: Appears her stated age, well developed, well nourished in  NAD. Skin: Warm, dry and intact. No rashes noted. HEENT: Head: normal shape and size; Eyes: sclera white, no icterus, conjunctiva pink, PERRLA and EOMs intact; Ears: Tm's gray and intact, normal light reflex;  Neck:  Neck supple, trachea midline. No masses, lumps or thyromegaly present.  Cardiovascular: Normal rate and rhythm. S1,S2 noted.  No murmur, rubs or gallops noted. No JVD or BLE edema. No carotid bruits noted. Pulmonary/Chest: Normal effort and positive vesicular breath sounds. No respiratory distress. No wheezes, rales or ronchi noted.  Abdomen: Soft and nontender. Normal bowel sounds. No distention or masses noted. Musculoskeletal: Strength 5/5 BUE/BLE. No difficulty with gait. Neurological: Alert and oriented. Cranial nerves II-XII grossly intact. Coordination normal. Negative Myerson's. Psychiatric: Mood and affect normal. Behavior is normal. Judgment and thought content normal.     BMET    Component Value Date/Time   NA 141 07/08/2018 1212   K 4.3 07/08/2018 1212   CL 110 07/08/2018 1212   CO2 24 07/08/2018 1212   GLUCOSE 115 (H) 07/08/2018 1212   BUN 11 07/08/2018 1212   CREATININE 1.16 07/08/2018 1212   CALCIUM 8.9 07/08/2018 1212    Lipid Panel     Component Value Date/Time   CHOL 167 02/08/2017 1349   TRIG 154.0 (H) 02/08/2017 1349   HDL 48.10 02/08/2017 1349   CHOLHDL 3 02/08/2017 1349   VLDL 30.8 02/08/2017 1349   LDLCALC 88 02/08/2017 1349    CBC    Component Value Date/Time   WBC 7.1 02/08/2017 1349   RBC 3.87 02/08/2017 1349   HGB 13.3 02/08/2017  1349   HCT 38.9 02/08/2017 1349   PLT 308.0 02/08/2017 1349   MCV 100.7 (H) 02/08/2017 1349   MCHC 34.0 02/08/2017 1349   RDW 13.8 02/08/2017 1349   LYMPHSABS 1.4 07/20/2015 0926   MONOABS 0.5 07/20/2015 0926   EOSABS 0.3 07/20/2015 0926   BASOSABS 0.0 07/20/2015 0926    Hgb A1C Lab Results  Component Value Date   HGBA1C 5.5 02/08/2017            Assessment & Plan:   Fatigue, Generalized Weakness, Dizziness:  Indication for ECG: dizziness, fatigue, weakness Interpretation: normal rate and rhythm Comparison: 12/2015, unchanged Will have her cut Carvedilol in half Will d/c Myrbetriq and Amlodipine as she is not taking these Encouraged adequate rest, fluid intake Discussed stress relieving techniques Will check CBC, CMET, Lipid, TSH, A1C, Vit D, B12, Folate, ANA, ESR, CRP and RF  Will follow up after labs, return precautions discussed Webb Silversmith, NP

## 2018-12-13 ENCOUNTER — Telehealth: Payer: Self-pay

## 2018-12-13 NOTE — Telephone Encounter (Signed)
Dana Miller(DPR signed) left v/m requesting cb today if possible for recent lab results so pt will not worry over weekend.Please advise.

## 2018-12-13 NOTE — Telephone Encounter (Signed)
See result note.  

## 2018-12-14 LAB — ANA: Anti Nuclear Antibody (ANA): NEGATIVE

## 2018-12-14 LAB — HIV ANTIBODY (ROUTINE TESTING W REFLEX): HIV 1&2 Ab, 4th Generation: NONREACTIVE

## 2018-12-17 ENCOUNTER — Encounter: Payer: Self-pay | Admitting: Internal Medicine

## 2018-12-23 ENCOUNTER — Other Ambulatory Visit: Payer: Self-pay | Admitting: Internal Medicine

## 2018-12-23 NOTE — Telephone Encounter (Signed)
Last filled 04/03/2018 with 5 refills... please advise if appropriate to refill

## 2019-01-01 ENCOUNTER — Ambulatory Visit (INDEPENDENT_AMBULATORY_CARE_PROVIDER_SITE_OTHER): Payer: BC Managed Care – PPO | Admitting: *Deleted

## 2019-01-01 DIAGNOSIS — E538 Deficiency of other specified B group vitamins: Secondary | ICD-10-CM | POA: Insufficient documentation

## 2019-01-01 MED ORDER — CYANOCOBALAMIN 1000 MCG/ML IJ SOLN
1000.0000 ug | Freq: Once | INTRAMUSCULAR | Status: AC
  Administered 2019-01-01: 1000 ug via INTRAMUSCULAR

## 2019-01-01 NOTE — Progress Notes (Signed)
Per orders of Nicki Reaper, NP, injection of Vitamin B12 given by Ileana Ladd. Patient tolerated injection well.

## 2019-01-09 NOTE — Progress Notes (Signed)
Pt due for B12 injection. Last B12 level 195, 11/2018. Continue injections for now. Repeat B12 level due 05/2019. Nicki Reaper, NP

## 2019-01-15 ENCOUNTER — Ambulatory Visit (INDEPENDENT_AMBULATORY_CARE_PROVIDER_SITE_OTHER): Payer: BC Managed Care – PPO

## 2019-01-15 DIAGNOSIS — E538 Deficiency of other specified B group vitamins: Secondary | ICD-10-CM | POA: Diagnosis not present

## 2019-01-15 MED ORDER — CYANOCOBALAMIN 1000 MCG/ML IJ SOLN
1000.0000 ug | Freq: Once | INTRAMUSCULAR | Status: AC
  Administered 2019-01-15: 1000 ug via INTRAMUSCULAR

## 2019-01-15 NOTE — Progress Notes (Signed)
Per orders of Dr. Alphonsus Sias today for Nicki Reaper, NP as she is out of the office, injection of Vitamin B12 given by Maryella Shivers. Administered to R deltoid IM.   Patient tolerated injection well.

## 2019-01-19 ENCOUNTER — Other Ambulatory Visit: Payer: Self-pay | Admitting: Internal Medicine

## 2019-01-20 ENCOUNTER — Other Ambulatory Visit: Payer: Self-pay | Admitting: Internal Medicine

## 2019-01-21 MED ORDER — BUSPIRONE HCL 5 MG PO TABS: 5.0000 mg | ORAL_TABLET | Freq: Two times a day (BID) | ORAL | 2 refills | Status: DC

## 2019-01-21 NOTE — Telephone Encounter (Signed)
Last filled 07/2018 90 day with no refill... please advise if pt is to continue

## 2019-01-25 ENCOUNTER — Other Ambulatory Visit: Payer: Self-pay | Admitting: Internal Medicine

## 2019-01-27 MED ORDER — QUETIAPINE FUMARATE 50 MG PO TABS: 25.0000 mg | ORAL_TABLET | Freq: Two times a day (BID) | ORAL | 1 refills | Status: DC

## 2019-01-27 MED ORDER — VENLAFAXINE HCL ER 75 MG PO CP24: 225.0000 mg | ORAL_CAPSULE | Freq: Every day | ORAL | 1 refills | Status: DC

## 2019-02-06 ENCOUNTER — Ambulatory Visit (INDEPENDENT_AMBULATORY_CARE_PROVIDER_SITE_OTHER): Payer: BC Managed Care – PPO

## 2019-02-06 DIAGNOSIS — E538 Deficiency of other specified B group vitamins: Secondary | ICD-10-CM | POA: Diagnosis not present

## 2019-02-06 MED ORDER — CYANOCOBALAMIN 1000 MCG/ML IJ SOLN
1000.0000 ug | Freq: Once | INTRAMUSCULAR | Status: AC
  Administered 2019-02-06: 1000 ug via INTRAMUSCULAR

## 2019-02-06 NOTE — Progress Notes (Signed)
Pt given bimonthly B12 injection in Left Deltoid. Tolerated well.   She will call to schedule her next 2 week injection. That will be the last of the bimonthly injections.

## 2019-02-20 ENCOUNTER — Telehealth: Payer: Self-pay | Admitting: Internal Medicine

## 2019-02-20 NOTE — Telephone Encounter (Signed)
Okay to have patient come in at 845 or 9am tomorrow.

## 2019-02-20 NOTE — Telephone Encounter (Signed)
Best number [phone] Stormy Card (spouse) called to schedule pt b12 injections.  He wanted to have this done Friday 7/10  Is this ok  If so what time would be best for you

## 2019-02-20 NOTE — Telephone Encounter (Signed)
Will defer to [facility] since Threasa Beards is out all week

## 2019-02-21 ENCOUNTER — Ambulatory Visit (INDEPENDENT_AMBULATORY_CARE_PROVIDER_SITE_OTHER): Payer: BC Managed Care – PPO

## 2019-02-21 DIAGNOSIS — E538 Deficiency of other specified B group vitamins: Secondary | ICD-10-CM | POA: Diagnosis not present

## 2019-02-21 MED ORDER — CYANOCOBALAMIN 1000 MCG/ML IJ SOLN
1000.0000 ug | Freq: Once | INTRAMUSCULAR | Status: AC
  Administered 2019-02-21: 1000 ug via INTRAMUSCULAR

## 2019-02-21 NOTE — Progress Notes (Signed)
Per orders of Webb Silversmith, NP, injection of Vit B12 in right deltoid given by Cortney Beissel M. Patient tolerated injection well.

## 2019-03-11 ENCOUNTER — Other Ambulatory Visit: Payer: Self-pay

## 2019-03-11 ENCOUNTER — Encounter: Payer: Self-pay | Admitting: Internal Medicine

## 2019-03-11 ENCOUNTER — Ambulatory Visit (INDEPENDENT_AMBULATORY_CARE_PROVIDER_SITE_OTHER): Payer: BC Managed Care – PPO | Admitting: Internal Medicine

## 2019-03-11 VITALS — BP 102/62 | HR 71 | Temp 97.8°F | Wt 168.0 lb

## 2019-03-11 DIAGNOSIS — E538 Deficiency of other specified B group vitamins: Secondary | ICD-10-CM | POA: Diagnosis not present

## 2019-03-11 DIAGNOSIS — F419 Anxiety disorder, unspecified: Secondary | ICD-10-CM | POA: Diagnosis not present

## 2019-03-11 DIAGNOSIS — G43819 Other migraine, intractable, without status migrainosus: Secondary | ICD-10-CM | POA: Diagnosis not present

## 2019-03-11 DIAGNOSIS — R238 Other skin changes: Secondary | ICD-10-CM | POA: Diagnosis not present

## 2019-03-11 DIAGNOSIS — R233 Spontaneous ecchymoses: Secondary | ICD-10-CM

## 2019-03-11 DIAGNOSIS — M25532 Pain in left wrist: Secondary | ICD-10-CM

## 2019-03-11 LAB — COMPREHENSIVE METABOLIC PANEL
ALT: 7 U/L (ref 0–35)
AST: 11 U/L (ref 0–37)
Albumin: 4.1 g/dL (ref 3.5–5.2)
Alkaline Phosphatase: 52 U/L (ref 39–117)
BUN: 14 mg/dL (ref 6–23)
CO2: 30 mEq/L (ref 19–32)
Calcium: 9.3 mg/dL (ref 8.4–10.5)
Chloride: 106 mEq/L (ref 96–112)
Creatinine, Ser: 1 mg/dL (ref 0.40–1.20)
GFR: 60.57 mL/min (ref >60.00)
Glucose, Bld: 72 mg/dL (ref 70–99)
Potassium: 4.6 mEq/L (ref 3.5–5.1)
Sodium: 142 mEq/L (ref 135–145)
Total Bilirubin: 0.4 mg/dL (ref 0.2–1.2)
Total Protein: 6.7 g/dL (ref 6.0–8.3)

## 2019-03-11 LAB — CBC
HCT: 40.1 % (ref 36.0–46.0)
Hemoglobin: 13.4 g/dL (ref 12.0–15.0)
MCHC: 33.5 g/dL (ref 30.0–36.0)
MCV: 104.3 fl — ABNORMAL HIGH (ref 78.0–100.0)
Platelets: 313 10*3/uL (ref 150.0–400.0)
RBC: 3.85 Mil/uL — ABNORMAL LOW (ref 3.87–5.11)
RDW: 13.8 % (ref 11.5–15.5)
WBC: 10.5 10*3/uL (ref 4.0–10.5)

## 2019-03-11 LAB — VITAMIN B12: Vitamin B-12: 297 pg/mL (ref 211–911)

## 2019-03-11 MED ORDER — TOPIRAMATE 100 MG PO TABS: 100.0000 mg | ORAL_TABLET | Freq: Every day | ORAL | 5 refills | Status: DC

## 2019-03-11 MED ORDER — HYDROXYZINE HCL 10 MG PO TABS: 10.0000 mg | ORAL_TABLET | Freq: Two times a day (BID) | ORAL | 0 refills | Status: DC | PRN

## 2019-03-11 NOTE — Patient Instructions (Signed)
Vitamin B12 Deficiency Vitamin B12 deficiency means that your body does not have enough vitamin B12. The body needs this vitamin:  To make red blood cells.  To make genes (DNA).  To help the nerves work. If you do not have enough vitamin B12 in your body, you can have health problems. What are the causes?  Not eating enough foods that contain vitamin B12.  Not being able to absorb vitamin B12 from the food that you eat.  Certain digestive system diseases.  A condition in which the body does not make enough of a certain protein, which results in too few red blood cells (pernicious anemia).  Having a surgery in which part of the stomach or small intestine is removed.  Taking medicines that make it hard for the body to absorb vitamin B12. These medicines include: ? Heartburn medicines. ? Some antibiotic medicines. ? Other medicines that are used to treat certain conditions. What increases the risk?  Being older than age 50.  Eating a vegetarian or vegan diet, especially while you are pregnant.  Eating a poor diet while you are pregnant.  Taking certain medicines.  Having alcoholism. What are the signs or symptoms? In some cases, there are no symptoms. If the condition leads to too few blood cells or nerve damage, symptoms can occur, such as:  Feeling weak.  Feeling tired (fatigued).  Not being hungry.  Weight loss.  A loss of feeling (numbness) or tingling in your hands and feet.  Redness and burning of the tongue.  Being mixed up (confused) or having memory problems.  Sadness (depression).  Problems with your senses. This can include color blindness, ringing in the ears, or loss of taste.  Watery poop (diarrhea) or trouble pooping (constipation).  Trouble walking. If anemia is very bad, symptoms can include:  Being short of breath.  Being dizzy.  Having a very fast heartbeat. How is this treated?  Changing the way you eat and drink, such as: ?  Eating more foods that contain vitamin B12. ? Drinking little or no alcohol.  Getting vitamin B12 shots.  Taking vitamin B12 supplements. Your doctor will tell you the dose that is best for you. Follow these instructions at home: Eating and drinking   Eat lots of healthy foods that contain vitamin B12. These include: ? Meats and poultry, such as beef, pork, chicken, turkey, and organ meats, such as liver. ? Seafood, such as clams, rainbow trout, salmon, tuna, and haddock. ? Eggs. ? Cereal and dairy products that have vitamin B12 added to them. Check the label. The items listed above may not be a complete list of what you can eat and drink. Contact a dietitian for more options. General instructions  Get any shots as told by your doctor.  Take supplements only as told by your doctor.  Do not drink alcohol if your doctor tells you not to. In some cases, you may only be asked to limit alcohol use.  Keep all follow-up visits as told by your doctor. This is important. Contact a doctor if:  Your symptoms come back. Get help right away if:  You have trouble breathing.  You have a very fast heartbeat.  You have chest pain.  You get dizzy.  You pass out. Summary  Vitamin B12 deficiency means that your body is not getting enough vitamin B12.  In some cases, there are no symptoms of this condition.  Treatment may include making a change in the way you eat and drink,   getting vitamin B12 shots, or taking supplements.  Eat lots of healthy foods that contain vitamin B12. This information is not intended to replace advice given to you by your health care provider. Make sure you discuss any questions you have with your health care provider. Document Released: 07/20/2011 Document Revised: 04/09/2018 Document Reviewed: 04/09/2018 Elsevier Patient Education  2020 [employer]  

## 2019-03-11 NOTE — Progress Notes (Signed)
Subjective:    Patient ID: Dana Miller, female    DOB: Jan 22, 1976, 43 y.o.   MRN: [id number]  HPI  Pt presents to the clinic today with c/o right wrist pain. This started about 1.5 week after cleaning out the gargae. She describes the pain as sharp and stabbing. The pain does not radiate. She has some tingling in her fingertips but she does not feel like it is related to this. She denied any joint swelling. She took CBD gummy, KT tape.   She also feels like she is bruising easier. She has noticed bruises. She denies any overt s/s of bleeding. She denies blood in her urine, abnormal vaginal bleeding or blood in her stool.   She would also like to recheck her B12. She was doing injections every 2 weeks. Her last injection was 2 weeks ago. She last had her B12 level checked 2 weeks.  She also reports worsening anxiety. She is feels like this is triggered by life stress. She is taking Effexor and Buspar as prescribed with some relief. She takes Seroquel at night for sleep. She is not currently seeing a therapist or a psychiatrist.   Review of Systems      Past Medical History:  Diagnosis Date  . Anxiety   . Cardiac microvascular disease   . Chicken pox   . Depression   . Thyroid mass    right side    Current Outpatient Medications  Medication Sig Dispense Refill  . aspirin EC 81 MG tablet Take 1 tablet by mouth daily.    Marland Kitchen. atorvastatin (LIPITOR) 20 MG tablet Take 1 tablet by mouth daily.  11  . busPIRone (BUSPAR) 5 MG tablet Take 1 tablet (5 mg total) by mouth 2 (two) times daily. 180 tablet 2  . EPINEPHrine 0.3 mg/0.3 mL IJ SOAJ injection Inject 0.3 mLs (0.3 mg total) into the muscle as needed. For Anaphylaxis 1 Device 0  . fexofenadine (ALLEGRA) 180 MG tablet Take 1 tablet by mouth daily.    . nitroGLYCERIN (NITROSTAT) 0.4 MG SL tablet Place 0.4 mg under the tongue every 5 (five) minutes as needed for chest pain.    . pramipexole (MIRAPEX) 0.5 MG tablet Take 1 tablet (0.5 mg  total) by mouth at bedtime. MUST SCHEDULE ANNUAL PHYSICAL EXAM 30 tablet 5  . QUEtiapine (SEROQUEL) 50 MG tablet Take 0.5-1 tablets (25-50 mg total) by mouth 2 (two) times daily. 180 tablet 1  . ranolazine (RANEXA) 500 MG 12 hr tablet Take 2 tablets by mouth daily.  3  . Selenium Sulfide 2.25 % SHAM Apply 1 application topically daily as needed. 1 Bottle 3  . topiramate (TOPAMAX) 100 MG tablet TAKE 1 TABLET BY MOUTH DAILY 30 tablet 5  . venlafaxine XR (EFFEXOR-XR) 75 MG 24 hr capsule Take 3 capsules (225 mg total) by mouth daily with breakfast. 270 capsule 1  . carvedilol (COREG) 25 MG tablet Take 0.5 tablets by mouth 2 (two) times daily.     No current facility-administered medications for this visit.     Allergies  Allergen Reactions  . Fentanyl Swelling  . Mirtazapine Swelling  . Penicillins Anaphylaxis  . Sulfa Antibiotics Hives  . Ms Contin  [Morphine] Swelling    Family History  Problem Relation Age of Onset  . Stroke Father   . Cancer Father        Kidney   . AAA (abdominal aortic aneurysm) Father   . Depression Sister   . Anxiety disorder Sister   .  Cancer Paternal Uncle        Lung  . Depression Maternal Grandmother   . Heart disease Paternal Grandmother   . Stroke Paternal Grandmother   . Diabetes Paternal Grandmother   . Diabetes Paternal Grandfather     Social History   Socioeconomic History  . Marital status: Married    Spouse name: Not on file  . Number of children: Not on file  . Years of education: Not on file  . Highest education level: Not on file  Occupational History  . Not on file  Social Needs  . Financial resource strain: Not on file  . Food insecurity    Worry: Not on file    Inability: Not on file  . Transportation needs    Medical: Not on file    Non-medical: Not on file  Tobacco Use  . Smoking status: Current Every Day Smoker    Packs/day: 0.50    Types: Cigarettes  . Smokeless tobacco: Never Used  Substance and Sexual Activity   . Alcohol use: Yes    Alcohol/week: 0.0 standard drinks    Comment: occasional  . Drug use: No  . Sexual activity: Yes  Lifestyle  . Physical activity    Days per week: Not on file    Minutes per session: Not on file  . Stress: Not on file  Relationships  . Social Musicianconnections    Talks on phone: Not on file    Gets together: Not on file    Attends religious service: Not on file    Active member of club or organization: Not on file    Attends meetings of clubs or organizations: Not on file    Relationship status: Not on file  . Intimate partner violence    Fear of current or ex partner: Not on file    Emotionally abused: Not on file    Physically abused: Not on file    Forced sexual activity: Not on file  Other Topics Concern  . Not on file  Social History Narrative  . Not on file     Constitutional: Pt reports fatiuge. Denies fever, malaise, headache or abrupt weight changes.  HEENT: Denies eye pain, eye redness, ear pain, ringing in the ears, wax buildup, runny nose, nasal congestion, bloody nose, or sore throat. Respiratory: Denies difficulty breathing, shortness of breath, cough or sputum production.   Cardiovascular: Denies chest pain, chest tightness, palpitations or swelling in the hands or feet.  Musculoskeletal: Pt reports left right wrist pain. Denies decrease in range of motion, difficulty with gait, muscle pain or joint swelling.  Skin: Pt reports easy bruising. Denies redness, rashes, lesions or ulcercations.  Neurological: Pt reports tingling in fingertips. Denies dizziness, difficulty with memory, difficulty with speech or problems with balance and coordination.  Psych: Pt reports anxiety. Denies  depression, SI/HI.  No other specific complaints in a complete review of systems (except as listed in HPI above).  Objective:   Physical Exam  BP 102/62   Pulse 71   Temp 97.8 F (36.6 C) (Oral)   Wt 168 lb (76.2 kg)   LMP 03/01/2019   SpO2 100%   BMI 26.71  kg/m  Wt Readings from Last 3 Encounters:  03/11/19 168 lb (76.2 kg)  12/12/18 167 lb (75.8 kg)  12/06/18 161 lb (73 kg)    General: Appears her stated age, well developed, well nourished in NAD. Skin: Warm, dry and intact. No rashes noted. Small healing bruise noted of right  upper arm.  Cardiovascular: Normal rate and rhythm. Pulmonary/Chest: Normal effort and positive vesicular breath sounds. No respiratory distress. No wheezes, rales or ronchi noted.  Musculoskeletal: Normal flexion, extension and rotation of the left wrist. No pain with palpation. No joint swelling noted. Hand grips equal.  Neurological: Alert and oriented. Coordination normal. Negative Tinel's. Negative Phalen's. Psychiatric: Anxious appearing today.  BMET    Component Value Date/Time   NA 142 12/12/2018 1049   K 4.3 12/12/2018 1049   CL 109 12/12/2018 1049   CO2 26 12/12/2018 1049   GLUCOSE 61 (L) 12/12/2018 1049   BUN 12 12/12/2018 1049   CREATININE 1.13 12/12/2018 1049   CALCIUM 8.8 12/12/2018 1049    Lipid Panel     Component Value Date/Time   CHOL 152 12/12/2018 1049   TRIG 149.0 12/12/2018 1049   HDL 60.10 12/12/2018 1049   CHOLHDL 3 12/12/2018 1049   VLDL 29.8 12/12/2018 1049   LDLCALC 62 12/12/2018 1049    CBC    Component Value Date/Time   WBC 9.0 12/12/2018 1049   RBC 3.84 (L) 12/12/2018 1049   HGB 13.4 12/12/2018 1049   HCT 39.3 12/12/2018 1049   PLT 288.0 12/12/2018 1049   MCV 102.2 (H) 12/12/2018 1049   MCHC 34.2 12/12/2018 1049   RDW 13.6 12/12/2018 1049   LYMPHSABS 1.4 07/20/2015 0926   MONOABS 0.5 07/20/2015 0926   EOSABS 0.3 07/20/2015 0926   BASOSABS 0.0 07/20/2015 0926    Hgb A1C Lab Results  Component Value Date   HGBA1C 5.4 12/12/2018           Assessment & Plan:   Left Wrist Pain:  Resolved without intervention No indication for xray at this time Will monitor  B12 Deficiency:  Repeat B12 level today Will decide frequency of B12 injections based  on results of lab today  Anxiety:  Deteriorated Support offered today Continue Buspar, Effexor and Seroquel Will trial Hydroxyzine 10 mg BID prn- sedation caution given  Easy Bruising:  Will check CBC, CMET today ? D/t ASA 81 mg daily- but she needs to continue this given her history of microvascular disease  Will follow up after labs, return precautions discussed Webb Silversmith, NP

## 2019-03-12 ENCOUNTER — Encounter: Payer: Self-pay | Admitting: Internal Medicine

## 2019-03-14 ENCOUNTER — Ambulatory Visit
Admission: RE | Admit: 2019-03-14 | Discharge: 2019-03-14 | Disposition: A | Discharge status: Home / Self Care | Payer: Medicare Other | Source: Ambulatory Visit | Attending: Internal Medicine | Admitting: Internal Medicine

## 2019-03-14 DIAGNOSIS — R109 Unspecified abdominal pain: Secondary | ICD-10-CM | POA: Diagnosis not present

## 2019-03-14 DIAGNOSIS — Z8051 Family history of malignant neoplasm of kidney: Secondary | ICD-10-CM

## 2019-03-20 ENCOUNTER — Ambulatory Visit (INDEPENDENT_AMBULATORY_CARE_PROVIDER_SITE_OTHER): Payer: BC Managed Care – PPO

## 2019-03-20 DIAGNOSIS — E538 Deficiency of other specified B group vitamins: Secondary | ICD-10-CM

## 2019-03-20 MED ORDER — CYANOCOBALAMIN 1000 MCG/ML IJ SOLN
1000.0000 ug | Freq: Once | INTRAMUSCULAR | Status: AC
  Administered 2019-03-20: 1000 ug via INTRAMUSCULAR

## 2019-03-20 NOTE — Progress Notes (Addendum)
Per orders of Regina Baity, NP, injection of vit B12 given by Quierra Silverio. Patient tolerated injection well.  

## 2019-04-03 ENCOUNTER — Ambulatory Visit: Payer: BC Managed Care – PPO

## 2019-04-03 ENCOUNTER — Encounter: Payer: Self-pay | Admitting: Internal Medicine

## 2019-04-03 ENCOUNTER — Other Ambulatory Visit: Payer: Self-pay

## 2019-04-16 ENCOUNTER — Telehealth: Payer: Self-pay | Admitting: *Deleted

## 2019-04-16 NOTE — Telephone Encounter (Signed)
Tried to call patient to confirm nurse visit tomorrow 04/17/19. Unable to leave a message because voicemail has not yet been set up. If patient calls back confirm nurse visit, curbside and perform covid screening.

## 2019-04-17 ENCOUNTER — Ambulatory Visit (INDEPENDENT_AMBULATORY_CARE_PROVIDER_SITE_OTHER): Payer: BC Managed Care – PPO

## 2019-04-17 ENCOUNTER — Other Ambulatory Visit: Payer: Self-pay | Admitting: Internal Medicine

## 2019-04-17 DIAGNOSIS — R252 Cramp and spasm: Secondary | ICD-10-CM

## 2019-04-17 DIAGNOSIS — E538 Deficiency of other specified B group vitamins: Secondary | ICD-10-CM

## 2019-04-17 MED ORDER — CYANOCOBALAMIN 1000 MCG/ML IJ SOLN
1000.0000 ug | Freq: Once | INTRAMUSCULAR | Status: AC
  Administered 2019-04-17: 1000 ug via INTRAMUSCULAR

## 2019-04-17 NOTE — Telephone Encounter (Signed)
Last filled 02/2018... with 5 refills...Marland Kitchen please advise

## 2019-04-17 NOTE — Progress Notes (Signed)
Pt received B12 injection in right deltoid. Pt tolerated well. Supposed to be every 2 weeks for 2 months, but she has not kept that schedule. Said she has scheduled for 2 weeks.

## 2019-04-24 ENCOUNTER — Ambulatory Visit: Payer: BC Managed Care – PPO | Admitting: Internal Medicine

## 2019-05-01 ENCOUNTER — Encounter: Payer: Self-pay | Admitting: Internal Medicine

## 2019-05-01 ENCOUNTER — Ambulatory Visit (INDEPENDENT_AMBULATORY_CARE_PROVIDER_SITE_OTHER): Payer: BC Managed Care – PPO | Admitting: Internal Medicine

## 2019-05-01 ENCOUNTER — Ambulatory Visit: Payer: Medicare Other

## 2019-05-01 ENCOUNTER — Other Ambulatory Visit: Payer: Self-pay

## 2019-05-01 VITALS — BP 108/68 | HR 78 | Temp 98.0°F | Wt 173.0 lb

## 2019-05-01 DIAGNOSIS — R4586 Emotional lability: Secondary | ICD-10-CM

## 2019-05-01 DIAGNOSIS — Z23 Encounter for immunization: Secondary | ICD-10-CM | POA: Diagnosis not present

## 2019-05-01 DIAGNOSIS — R4587 Impulsiveness: Secondary | ICD-10-CM

## 2019-05-01 DIAGNOSIS — F419 Anxiety disorder, unspecified: Secondary | ICD-10-CM | POA: Diagnosis not present

## 2019-05-01 DIAGNOSIS — F69 Unspecified disorder of adult personality and behavior: Secondary | ICD-10-CM | POA: Diagnosis not present

## 2019-05-01 DIAGNOSIS — E538 Deficiency of other specified B group vitamins: Secondary | ICD-10-CM

## 2019-05-01 DIAGNOSIS — F329 Major depressive disorder, single episode, unspecified: Secondary | ICD-10-CM

## 2019-05-01 DIAGNOSIS — F5104 Psychophysiologic insomnia: Secondary | ICD-10-CM

## 2019-05-01 DIAGNOSIS — F32A Depression, unspecified: Secondary | ICD-10-CM

## 2019-05-01 MED ORDER — CYANOCOBALAMIN 1000 MCG/ML IJ SOLN
1000.0000 ug | Freq: Once | INTRAMUSCULAR | Status: AC
  Administered 2019-05-01: 1000 ug via INTRAMUSCULAR

## 2019-05-01 NOTE — Patient Instructions (Signed)
Depression Screening Depression screening is a tool that your health care provider can use to learn if you have symptoms of depression. Depression is a common condition with many symptoms that are also often found in other conditions. Depression is treatable, but it must first be diagnosed. You may not know that certain feelings, thoughts, and behaviors that you are having can be symptoms of depression. Taking a depression screening test can help you and your health care provider decide if you need more assessment, or if you should be referred to a mental health care provider. What are the screening tests?  You may have a physical exam to see if another condition is affecting your mental health. You may have a blood or urine sample taken during the physical exam.  You may be interviewed using a screening tool that was developed from research, such as one of these: ? Patient Health Questionnaire (PHQ). This is a set of either 2 or 9 questions. A health care provider who has been trained to score this screening test uses a guide to assess if your symptoms suggest that you may have depression. ? Hamilton Depression Rating Scale (HAM-D). This is a set of either 17 or 24 questions. You may be asked to take it again during or after your treatment, to see if your depression has gotten better. ? Beck Depression Inventory (BDI). This is a set of 21 multiple choice questions. Your health care provider scores your answers to assess:  Your level of depression, ranging from mild to severe.  Your response to treatment.  Your health care provider may talk with you about your daily activities, such as eating, sleeping, work, and recreation, and ask if you have had any changes in activity.  Your health care provider may ask you to see a mental health specialist, such as a psychiatrist or psychologist, for more evaluation. Who should be screened for depression?   All adults, including adults with a family history  of a mental health disorder.  Adolescents who are 12-18 years old.  People who are recovering from a myocardial infarction (MI).  Pregnant women, or women who have given birth.  People who have a long-term (chronic) illness.  Anyone who has been diagnosed with another type of a mental health disorder.  Anyone who has symptoms that could show depression. What do my results mean? Your health care provider will review the results of your depression screening, physical exam, and lab tests. Positive screens suggest that you may have depression. Screening is the first step in getting the care that you may need. It is up to you to get your screening results. Ask your health care provider, or the department that is doing your screening tests, when your results will be ready. Talk with your health care provider about your results and diagnosis. A diagnosis of depression is made using the Diagnostic and Statistical Manual of Mental Disorders (DSM-V). This is a book that lists the number and type of symptoms that must be present for a health care provider to give a specific diagnosis.  Your health care provider may work with you to treat your symptoms of depression, or your health care provider may help you find a mental health provider who can assess, diagnose, and treat your depression. Get help right away if:  You have thoughts about hurting yourself or others. If you ever feel like you may hurt yourself or others, or have thoughts about taking your own life, get help right away. You   can go to your nearest emergency department or call:  Your local emergency services (911 in the [place]  A suicide crisis helpline, such as the National Suicide Prevention Lifeline at 1-800-273-8255. This is open 24 hours a day. Summary  Depression screening is the first step in getting the help that you may need.  If your screening test shows symptoms of depression (is positive), your health care provider may ask  you to see a mental health provider.  Anyone who is age 12 or older should be screened for depression. This information is not intended to replace advice given to you by your health care provider. Make sure you discuss any questions you have with your health care provider. Document Released: 12/15/2016 Document Revised: 07/13/2017 Document Reviewed: 12/15/2016 Elsevier Patient Education  2020 [employer] Inc[employer]  

## 2019-05-01 NOTE — Progress Notes (Signed)
Subjective:    Patient ID: Dana Miller, female    DOB: 05-10-1976, 43 y.o.   MRN: [id number]  HPI  Pt presents to the clinic today requesting referral for psychiatry and psychology. She reports irrational behaviors, insomnia, spending sprees. She has a history of anxiety and depression, managed on Buspar, Effexor, Seroquel and Hydroxyzine. She is having marital problems and has been for years. She denies SI/HI. She has seen psychiatry in the past.   Review of Systems      Past Medical History:  Diagnosis Date  . Anxiety   . Cardiac microvascular disease   . Chicken pox   . Depression   . Thyroid mass    right side    Current Outpatient Medications  Medication Sig Dispense Refill  . amLODipine (NORVASC) 10 MG tablet Take by mouth.    Marland Kitchen. aspirin EC 81 MG tablet Take 1 tablet by mouth daily.    Marland Kitchen. atorvastatin (LIPITOR) 20 MG tablet Take 1 tablet by mouth daily.  11  . busPIRone (BUSPAR) 5 MG tablet Take 1 tablet (5 mg total) by mouth 2 (two) times daily. 180 tablet 2  . carvedilol (COREG) 25 MG tablet Take 0.5 tablets by mouth daily at 8 pm.     . EPINEPHrine 0.3 mg/0.3 mL IJ SOAJ injection Inject 0.3 mLs (0.3 mg total) into the muscle as needed. For Anaphylaxis 1 Device 0  . fexofenadine (ALLEGRA) 180 MG tablet Take 1 tablet by mouth daily.    . hydrOXYzine (ATARAX/VISTARIL) 10 MG tablet Take 1 tablet (10 mg total) by mouth 2 (two) times daily as needed. 60 tablet 0  . nitroGLYCERIN (NITROSTAT) 0.4 MG SL tablet Place 0.4 mg under the tongue every 5 (five) minutes as needed for chest pain.    . pramipexole (MIRAPEX) 0.5 MG tablet TAKE 1 TABLET BY MOUTH AT BEDTIME. MUST SCHEDULE ANNUAL PHYSICAL EXAM. 90 tablet 1  . QUEtiapine (SEROQUEL) 50 MG tablet Take 0.5-1 tablets (25-50 mg total) by mouth 2 (two) times daily. 180 tablet 1  . ranolazine (RANEXA) 500 MG 12 hr tablet Take 2 tablets by mouth daily.  3  . Selenium Sulfide 2.25 % SHAM Apply 1 application topically daily as needed.  1 Bottle 3  . topiramate (TOPAMAX) 100 MG tablet Take 1 tablet (100 mg total) by mouth daily. 30 tablet 5  . venlafaxine XR (EFFEXOR-XR) 75 MG 24 hr capsule Take 3 capsules (225 mg total) by mouth daily with breakfast. 270 capsule 1   No current facility-administered medications for this visit.     Allergies  Allergen Reactions  . Fentanyl Swelling  . Mirtazapine Swelling  . Penicillins Anaphylaxis  . Sulfa Antibiotics Hives  . Ms Contin  [Morphine] Swelling    Family History  Problem Relation Age of Onset  . Stroke Father   . Cancer Father        Kidney   . AAA (abdominal aortic aneurysm) Father   . Depression Sister   . Anxiety disorder Sister   . Cancer Paternal Uncle        Lung  . Depression Maternal Grandmother   . Heart disease Paternal Grandmother   . Stroke Paternal Grandmother   . Diabetes Paternal Grandmother   . Diabetes Paternal Grandfather     Social History   Socioeconomic History  . Marital status: Married    Spouse name: Not on file  . Number of children: Not on file  . Years of education: Not on file  .  Highest education level: Not on file  Occupational History  . Not on file  Social Needs  . Financial resource strain: Not on file  . Food insecurity    Worry: Not on file    Inability: Not on file  . Transportation needs    Medical: Not on file    Non-medical: Not on file  Tobacco Use  . Smoking status: Current Every Day Smoker    Packs/day: 0.50    Types: Cigarettes  . Smokeless tobacco: Never Used  Substance and Sexual Activity  . Alcohol use: Yes    Alcohol/week: 0.0 standard drinks    Comment: occasional  . Drug use: No  . Sexual activity: Yes  Lifestyle  . Physical activity    Days per week: Not on file    Minutes per session: Not on file  . Stress: Not on file  Relationships  . Social Musician on phone: Not on file    Gets together: Not on file    Attends religious service: Not on file    Active member of club  or organization: Not on file    Attends meetings of clubs or organizations: Not on file    Relationship status: Not on file  . Intimate partner violence    Fear of current or ex partner: Not on file    Emotionally abused: Not on file    Physically abused: Not on file    Forced sexual activity: Not on file  Other Topics Concern  . Not on file  Social History Narrative  . Not on file     Constitutional: Denies fever, malaise, fatigue, headache or abrupt weight changes.  Respiratory: Denies difficulty breathing, shortness of breath, cough or sputum production.   Cardiovascular: Denies chest pain, chest tightness, palpitations or swelling in the hands or feet.  Neurological: Pt reports insomnia. Denies dizziness, difficulty with memory, difficulty with speech or problems with balance and coordination.  Psych: Pt has a history of anxiety and depression. Denies SI/HI.  No other specific complaints in a complete review of systems (except as listed in HPI above).  Objective:   Physical Exam   BP 108/68   Pulse 78   Temp 98 F (36.7 C) (Temporal)   Wt 173 lb (78.5 kg)   SpO2 98%   BMI 27.50 kg/m  Wt Readings from Last 3 Encounters:  05/01/19 173 lb (78.5 kg)  03/11/19 168 lb (76.2 kg)  12/12/18 167 lb (75.8 kg)    General: Appears her stated age, well developed, well nourished in NAD. Cardiovascular: Normal rate and rhythm.  Pulmonary/Chest: Normal effort and positive vesicular breath sounds. No respiratory distress. No wheezes, rales or ronchi noted.  Neurological: Alert and oriented.   Psychiatric: Tearful. Contracts for safety.    BMET    Component Value Date/Time   NA 142 03/11/2019 0928   K 4.6 03/11/2019 0928   CL 106 03/11/2019 0928   CO2 30 03/11/2019 0928   GLUCOSE 72 03/11/2019 0928   BUN 14 03/11/2019 0928   CREATININE 1.00 03/11/2019 0928   CALCIUM 9.3 03/11/2019 0928    Lipid Panel     Component Value Date/Time   CHOL 152 12/12/2018 1049   TRIG  149.0 12/12/2018 1049   HDL 60.10 12/12/2018 1049   CHOLHDL 3 12/12/2018 1049   VLDL 29.8 12/12/2018 1049   LDLCALC 62 12/12/2018 1049    CBC    Component Value Date/Time   WBC 10.5 03/11/2019 0928  RBC 3.85 (L) 03/11/2019 0928   HGB 13.4 03/11/2019 0928   HCT 40.1 03/11/2019 0928   PLT 313.0 03/11/2019 0928   MCV 104.3 (H) 03/11/2019 0928   MCHC 33.5 03/11/2019 0928   RDW 13.8 03/11/2019 0928   LYMPHSABS 1.4 07/20/2015 0926   MONOABS 0.5 07/20/2015 0926   EOSABS 0.3 07/20/2015 0926   BASOSABS 0.0 07/20/2015 0926    Hgb A1C Lab Results  Component Value Date   HGBA1C 5.4 12/12/2018           Assessment & Plan:   Anxiety and Depression, Insomnia, Mood Swings, Irrational Behaviors and Impulsiveness:  Support offered today I don't want to adjust her meds without a clear diagnosis Referral placed to psychiatry (Dr. Nicolasa Ducking) and psychology  Return precautions discussed Webb Silversmith, NP

## 2019-05-01 NOTE — Addendum Note (Signed)
Addended by: Lurlean Nanny on: 05/01/2019 12:41 PM   Modules accepted: Orders

## 2019-05-15 ENCOUNTER — Ambulatory Visit: Payer: BC Managed Care – PPO

## 2019-05-15 ENCOUNTER — Other Ambulatory Visit: Payer: Self-pay

## 2019-05-19 ENCOUNTER — Ambulatory Visit: Payer: BC Managed Care – PPO | Admitting: Licensed Clinical Social Worker

## 2019-05-19 ENCOUNTER — Ambulatory Visit (INDEPENDENT_AMBULATORY_CARE_PROVIDER_SITE_OTHER): Payer: BC Managed Care – PPO | Admitting: Psychology

## 2019-05-19 DIAGNOSIS — F4323 Adjustment disorder with mixed anxiety and depressed mood: Secondary | ICD-10-CM | POA: Diagnosis not present

## 2019-05-28 ENCOUNTER — Ambulatory Visit: Payer: BC Managed Care – PPO | Admitting: Psychology

## 2019-05-30 ENCOUNTER — Other Ambulatory Visit: Payer: Self-pay | Admitting: Internal Medicine

## 2019-05-30 DIAGNOSIS — F419 Anxiety disorder, unspecified: Secondary | ICD-10-CM

## 2019-06-02 NOTE — Telephone Encounter (Signed)
Last filled 03/11/2019.Marland KitchenMarland KitchenMarland Kitchen please advise if appropriate to refill

## 2019-06-11 ENCOUNTER — Telehealth (HOSPITAL_COMMUNITY): Payer: Self-pay | Admitting: Professional

## 2019-06-23 ENCOUNTER — Telehealth (HOSPITAL_COMMUNITY): Payer: Self-pay | Admitting: Professional

## 2019-06-27 ENCOUNTER — Telehealth (HOSPITAL_COMMUNITY): Payer: Self-pay | Admitting: Professional

## 2019-07-16 ENCOUNTER — Other Ambulatory Visit: Payer: Self-pay | Admitting: Internal Medicine

## 2019-07-29 ENCOUNTER — Ambulatory Visit (INDEPENDENT_AMBULATORY_CARE_PROVIDER_SITE_OTHER): Payer: BC Managed Care – PPO | Admitting: Internal Medicine

## 2019-07-29 ENCOUNTER — Other Ambulatory Visit: Payer: Self-pay

## 2019-07-29 ENCOUNTER — Encounter: Payer: Self-pay | Admitting: Internal Medicine

## 2019-07-29 VITALS — BP 106/74 | HR 67 | Temp 97.9°F | Ht 65.75 in | Wt 169.0 lb

## 2019-07-29 DIAGNOSIS — Z0001 Encounter for general adult medical examination with abnormal findings: Secondary | ICD-10-CM

## 2019-07-29 DIAGNOSIS — I2585 Chronic coronary microvascular dysfunction: Secondary | ICD-10-CM

## 2019-07-29 DIAGNOSIS — R079 Chest pain, unspecified: Secondary | ICD-10-CM | POA: Diagnosis not present

## 2019-07-29 DIAGNOSIS — F329 Major depressive disorder, single episode, unspecified: Secondary | ICD-10-CM

## 2019-07-29 DIAGNOSIS — R221 Localized swelling, mass and lump, neck: Secondary | ICD-10-CM

## 2019-07-29 DIAGNOSIS — G2581 Restless legs syndrome: Secondary | ICD-10-CM

## 2019-07-29 DIAGNOSIS — F419 Anxiety disorder, unspecified: Secondary | ICD-10-CM | POA: Diagnosis not present

## 2019-07-29 DIAGNOSIS — I2589 Other forms of chronic ischemic heart disease: Secondary | ICD-10-CM

## 2019-07-29 DIAGNOSIS — Z1231 Encounter for screening mammogram for malignant neoplasm of breast: Secondary | ICD-10-CM | POA: Diagnosis not present

## 2019-07-29 DIAGNOSIS — E538 Deficiency of other specified B group vitamins: Secondary | ICD-10-CM

## 2019-07-29 DIAGNOSIS — G43819 Other migraine, intractable, without status migrainosus: Secondary | ICD-10-CM

## 2019-07-29 DIAGNOSIS — G8929 Other chronic pain: Secondary | ICD-10-CM

## 2019-07-29 MED ORDER — SUMATRIPTAN SUCCINATE 25 MG PO TABS: 25.0000 mg | ORAL_TABLET | ORAL | 0 refills | Status: DC | PRN

## 2019-07-29 MED ORDER — CYANOCOBALAMIN 1000 MCG/ML IJ SOLN
1000.0000 ug | Freq: Once | INTRAMUSCULAR | Status: AC
  Administered 2019-07-29: 15:00:00 1000 ug via INTRAMUSCULAR

## 2019-07-29 NOTE — Patient Instructions (Signed)
Health Maintenance, Female Adopting a healthy lifestyle and getting preventive care are important in promoting health and wellness. Ask your health care provider about:  The right schedule for you to have regular tests and exams.  Things you can do on your own to prevent diseases and keep yourself healthy. What should I know about diet, weight, and exercise? Eat a healthy diet   Eat a diet that includes plenty of vegetables, fruits, low-fat dairy products, and lean protein.  Do not eat a lot of foods that are high in solid fats, added sugars, or sodium. Maintain a healthy weight Body mass index (BMI) is used to identify weight problems. It estimates body fat based on height and weight. Your health care provider can help determine your BMI and help you achieve or maintain a healthy weight. Get regular exercise Get regular exercise. This is one of the most important things you can do for your health. Most adults should:  Exercise for at least 150 minutes each week. The exercise should increase your heart rate and make you sweat (moderate-intensity exercise).  Do strengthening exercises at least twice a week. This is in addition to the moderate-intensity exercise.  Spend less time sitting. Even light physical activity can be beneficial. Watch cholesterol and blood lipids Have your blood tested for lipids and cholesterol at 43 years of age, then have this test every 5 years. Have your cholesterol levels checked more often if:  Your lipid or cholesterol levels are high.  You are older than 43 years of age.  You are at high risk for heart disease. What should I know about cancer screening? Depending on your health history and family history, you may need to have cancer screening at various ages. This may include screening for:  Breast cancer.  Cervical cancer.  Colorectal cancer.  Skin cancer.  Lung cancer. What should I know about heart disease, diabetes, and high blood  pressure? Blood pressure and heart disease  High blood pressure causes heart disease and increases the risk of stroke. This is more likely to develop in people who have high blood pressure readings, are of African descent, or are overweight.  Have your blood pressure checked: ? Every 3-5 years if you are 18-39 years of age. ? Every year if you are 40 years old or older. Diabetes Have regular diabetes screenings. This checks your fasting blood sugar level. Have the screening done:  Once every three years after age 40 if you are at a normal weight and have a low risk for diabetes.  More often and at a younger age if you are overweight or have a high risk for diabetes. What should I know about preventing infection? Hepatitis B If you have a higher risk for hepatitis B, you should be screened for this virus. Talk with your health care provider to find out if you are at risk for hepatitis B infection. Hepatitis C Testing is recommended for:  Everyone born from 1945 through 1965.  Anyone with known risk factors for hepatitis C. Sexually transmitted infections (STIs)  Get screened for STIs, including gonorrhea and chlamydia, if: ? You are sexually active and are younger than 43 years of age. ? You are older than 43 years of age and your health care provider tells you that you are at risk for this type of infection. ? Your sexual activity has changed since you were last screened, and you are at increased risk for chlamydia or gonorrhea. Ask your health care provider if   you are at risk.  Ask your health care provider about whether you are at high risk for HIV. Your health care provider may recommend a prescription medicine to help prevent HIV infection. If you choose to take medicine to prevent HIV, you should first get tested for HIV. You should then be tested every 3 months for as long as you are taking the medicine. Pregnancy  If you are about to stop having your period (premenopausal) and  you may become pregnant, seek counseling before you get pregnant.  Take 400 to 800 micrograms (mcg) of folic acid every day if you become pregnant.  Ask for birth control (contraception) if you want to prevent pregnancy. Osteoporosis and menopause Osteoporosis is a disease in which the bones lose minerals and strength with aging. This can result in bone fractures. If you are 65 years old or older, or if you are at risk for osteoporosis and fractures, ask your health care provider if you should:  Be screened for bone loss.  Take a calcium or vitamin D supplement to lower your risk of fractures.  Be given hormone replacement therapy (HRT) to treat symptoms of menopause. Follow these instructions at home: Lifestyle  Do not use any products that contain nicotine or tobacco, such as cigarettes, e-cigarettes, and chewing tobacco. If you need help quitting, ask your health care provider.  Do not use street drugs.  Do not share needles.  Ask your health care provider for help if you need support or information about quitting drugs. Alcohol use  Do not drink alcohol if: ? Your health care provider tells you not to drink. ? You are pregnant, may be pregnant, or are planning to become pregnant.  If you drink alcohol: ? Limit how much you use to 0-1 drink a day. ? Limit intake if you are breastfeeding.  Be aware of how much alcohol is in your drink. In the [place] one drink equals one 12 oz bottle of beer (355 mL), one 5 oz glass of wine (148 mL), or one 1 oz glass of hard liquor (44 mL). General instructions  Schedule regular health, dental, and eye exams.  Stay current with your vaccines.  Tell your health care provider if: ? You often feel depressed. ? You have ever been abused or do not feel safe at home. Summary  Adopting a healthy lifestyle and getting preventive care are important in promoting health and wellness.  Follow your health care provider's instructions about healthy  diet, exercising, and getting tested or screened for diseases.  Follow your health care provider's instructions on monitoring your cholesterol and blood pressure. This information is not intended to replace advice given to you by your health care provider. Make sure you discuss any questions you have with your health care provider. Document Released: 02/13/2011 Document Revised: 07/24/2018 Document Reviewed: 07/24/2018 Elsevier Patient Education  2020 [employer]  

## 2019-07-29 NOTE — Progress Notes (Signed)
Subjective:    Patient ID: Dana Miller, female    DOB: 23-Nov-1975, 43 y.o.   MRN: [id number]  HPI  Pt presents to the clinic today for her annual exam. She is also due to follow up chronic conditions.  Anxiety and Depression: Chronic. Managed on Venlafaxine, Buspar, Seroquel and Hydroxyzine. She is sees Dr. Laymond PurserPerrin but has not seen her in 2 1/2 months. She is trying to get in with a psychiatrist, does not need referral. She denies SI/HI.  Chronic Microvascular Disease: Her BP today 106/74. Currently managed on Amlodipine, Carvedilol, ASA, Atorvastatin and Ranexa. She has a history of chronic chest pain. She does follow with cardiology.  Migraines: These occur a few times per week, likely due to increased stress. Managed on Topamax. She is not taking anything for breakthrough. She is not currently following with neurology.  RLS: Managed on Mirapex with good relief. She stretches often.  B12 Deficiency. Her last B12 was 297, 02/2019. She is not getting monthly injections.  Flu: 04/2019 Tetanus: 12/2014 Pap Smear: 12/2014 Mammogram: never Vision Screening: as needed Dentist: biannually  Diet: She does eat some meat. She consumes fruits and veggies daily.  She does eat some fried foods occasionally. She drinks mostly coffee, green tea, flavored water. Exercise: None  Review of Systems      Past Medical History:  Diagnosis Date  . Anxiety   . Cardiac microvascular disease   . Chicken pox   . Depression   . Thyroid mass    right side    Current Outpatient Medications  Medication Sig Dispense Refill  . amLODipine (NORVASC) 10 MG tablet Take by mouth.    Marland Kitchen. aspirin EC 81 MG tablet Take 1 tablet by mouth daily.    Marland Kitchen. atorvastatin (LIPITOR) 20 MG tablet Take 1 tablet by mouth daily.  11  . busPIRone (BUSPAR) 5 MG tablet TAKE 1 TABLET(5 MG) BY MOUTH TWICE DAILY 180 tablet 1  . carvedilol (COREG) 25 MG tablet Take 0.5 tablets by mouth daily at 8 pm.     . EPINEPHrine 0.3 mg/0.3 mL  IJ SOAJ injection Inject 0.3 mLs (0.3 mg total) into the muscle as needed. For Anaphylaxis 1 Device 0  . fexofenadine (ALLEGRA) 180 MG tablet Take 1 tablet by mouth daily.    . hydrOXYzine (ATARAX/VISTARIL) 10 MG tablet TAKE 1 TABLET(10 MG) BY MOUTH TWICE DAILY AS NEEDED 60 tablet 0  . nitroGLYCERIN (NITROSTAT) 0.4 MG SL tablet Place 0.4 mg under the tongue every 5 (five) minutes as needed for chest pain.    . pramipexole (MIRAPEX) 0.5 MG tablet TAKE 1 TABLET BY MOUTH AT BEDTIME. MUST SCHEDULE ANNUAL PHYSICAL EXAM. 90 tablet 1  . QUEtiapine (SEROQUEL) 50 MG tablet TAKE 1/2 TO 1 TABLET(25 TO 50 MG) BY MOUTH TWICE DAILY 180 tablet 0  . ranolazine (RANEXA) 500 MG 12 hr tablet Take 2 tablets by mouth daily.  3  . Selenium Sulfide 2.25 % SHAM Apply 1 application topically daily as needed. 1 Bottle 3  . topiramate (TOPAMAX) 100 MG tablet Take 1 tablet (100 mg total) by mouth daily. 30 tablet 5  . venlafaxine XR (EFFEXOR-XR) 75 MG 24 hr capsule Take 3 capsules (225 mg total) by mouth daily with breakfast. 270 capsule 1   No current facility-administered medications for this visit.    Allergies  Allergen Reactions  . Fentanyl Swelling  . Mirtazapine Swelling  . Penicillins Anaphylaxis  . Sulfa Antibiotics Hives  . Ms Contin  [Morphine] Swelling  Family History  Problem Relation Age of Onset  . Stroke Father   . Cancer Father        Kidney   . AAA (abdominal aortic aneurysm) Father   . Depression Sister   . Anxiety disorder Sister   . Cancer Paternal Uncle        Lung  . Depression Maternal Grandmother   . Heart disease Paternal Grandmother   . Stroke Paternal Grandmother   . Diabetes Paternal Grandmother   . Diabetes Paternal Grandfather     Social History   Socioeconomic History  . Marital status: Married    Spouse name: Not on file  . Number of children: Not on file  . Years of education: Not on file  . Highest education level: Not on file  Occupational History  . Not on  file  Tobacco Use  . Smoking status: Current Every Day Smoker    Packs/day: 0.50    Types: Cigarettes  . Smokeless tobacco: Never Used  Substance and Sexual Activity  . Alcohol use: Yes    Alcohol/week: 0.0 standard drinks    Comment: occasional  . Drug use: No  . Sexual activity: Yes  Other Topics Concern  . Not on file  Social History Narrative  . Not on file   Social Determinants of Health   Financial Resource Strain:   . Difficulty of Paying Living Expenses: Not on file  Food Insecurity:   . Worried About Charity fundraiser in the Last Year: Not on file  . Ran Out of Food in the Last Year: Not on file  Transportation Needs:   . Lack of Transportation (Medical): Not on file  . Lack of Transportation (Non-Medical): Not on file  Physical Activity:   . Days of Exercise per Week: Not on file  . Minutes of Exercise per Session: Not on file  Stress:   . Feeling of Stress : Not on file  Social Connections:   . Frequency of Communication with Friends and Family: Not on file  . Frequency of Social Gatherings with Friends and Family: Not on file  . Attends Religious Services: Not on file  . Active Member of Clubs or Organizations: Not on file  . Attends Archivist Meetings: Not on file  . Marital Status: Not on file  Intimate Partner Violence:   . Fear of Current or Ex-Partner: Not on file  . Emotionally Abused: Not on file  . Physically Abused: Not on file  . Sexually Abused: Not on file     Constitutional: Pt reports frequent headaches. Denies fever, malaise, fatigue, or abrupt weight changes.  HEENT: Denies eye pain, eye redness, ear pain, ringing in the ears, wax buildup, runny nose, nasal congestion, bloody nose, or sore throat. Respiratory: Denies difficulty breathing, shortness of breath, cough or sputum production.   Cardiovascular: Pt reports chronic chest pain. Denies chest tightness, palpitations or swelling in the hands or feet.  Gastrointestinal:  Denies abdominal pain, bloating, constipation, diarrhea or blood in the stool.  GU: Denies urgency, frequency, pain with urination, burning sensation, blood in urine, odor or discharge. Musculoskeletal: Denies decrease in range of motion, difficulty with gait, muscle pain or joint pain and swelling.  Skin: Denies redness, rashes, lesions or ulcercations.  Neurological: Denies dizziness, difficulty with memory, difficulty with speech or problems with balance and coordination.  Psych: Pt has a history of anxiety and depression. Denies SI/HI.  No other specific complaints in a complete review of systems (except  as listed in HPI above).  Objective:   Physical Exam  BP 106/74   Pulse 67   Temp 97.9 F (36.6 C) (Temporal)   Ht 5' 5.75" (1.67 m)   Wt 169 lb (76.7 kg)   LMP 07/18/2019   SpO2 98%   BMI 27.49 kg/m   Wt Readings from Last 3 Encounters:  05/01/19 173 lb (78.5 kg)  03/11/19 168 lb (76.2 kg)  12/12/18 167 lb (75.8 kg)    General: Appears her stated age, well developed, well nourished in NAD. Skin: Warm, dry and intact. No rashesnoted. HEENT: Head: normal shape and size; Eyes: sclera white, no icterus, conjunctiva pink, PERRLA and EOMs intact; Neck:  Neck supple, trachea midline. 2 cm mass noted just above the thyroid on the left. Cardiovascular: Normal rate and rhythm. S1,S2 noted.  No murmur, rubs or gallops noted. No JVD or BLE edema.  Pulmonary/Chest: Normal effort and positive vesicular breath sounds. No respiratory distress. No wheezes, rales or ronchi noted.  Abdomen: Soft and nontender. Normal bowel sounds. No distention or masses noted. Liver, spleen and kidneys non palpable. Musculoskeletal: Strength 5/5 BUE/BLE. No difficulty with gait.  Neurological: Alert and oriented. Cranial nerves II-XII grossly intact. Coordination normal.  Psychiatric: Mood and affect normal. Behavior is normal. Judgment and thought content normal.     BMET    Component Value Date/Time    NA 142 03/11/2019 0928   K 4.6 03/11/2019 0928   CL 106 03/11/2019 0928   CO2 30 03/11/2019 0928   GLUCOSE 72 03/11/2019 0928   BUN 14 03/11/2019 0928   CREATININE 1.00 03/11/2019 0928   CALCIUM 9.3 03/11/2019 0928    Lipid Panel     Component Value Date/Time   CHOL 152 12/12/2018 1049   TRIG 149.0 12/12/2018 1049   HDL 60.10 12/12/2018 1049   CHOLHDL 3 12/12/2018 1049   VLDL 29.8 12/12/2018 1049   LDLCALC 62 12/12/2018 1049    CBC    Component Value Date/Time   WBC 10.5 03/11/2019 0928   RBC 3.85 (L) 03/11/2019 0928   HGB 13.4 03/11/2019 0928   HCT 40.1 03/11/2019 0928   PLT 313.0 03/11/2019 0928   MCV 104.3 (H) 03/11/2019 0928   MCHC 33.5 03/11/2019 0928   RDW 13.8 03/11/2019 0928   LYMPHSABS 1.4 07/20/2015 0926   MONOABS 0.5 07/20/2015 0926   EOSABS 0.3 07/20/2015 0926   BASOSABS 0.0 07/20/2015 0926    Hgb A1C Lab Results  Component Value Date   HGBA1C 5.4 12/12/2018            Assessment & Plan:   Preventative Health Maintenance:  Flu shot UTD Tetanus UTD Pap smear UTD Mammogram ordered, [facility] schedule Encouraged her to consume a balanced diet and exercise regimen Advised her to see an eye doctor and dentist annually Will check CMET and Lipid profile today  Mass of Neck:  TSH and Free T4 today Will obtain US soft tissue of neck  RTC in 1 year, sooner if needed  Nicki Reaper, NP This visit occurred during the SARS-CoV-2 public health emergency.  Safety protocols were in place, including screening questions prior to the visit, additional usage of staff PPE, and extensive cleaning of exam room while observing appropriate contact time as indicated for disinfecting solutions.

## 2019-07-30 LAB — COMPREHENSIVE METABOLIC PANEL
ALT: 5 U/L (ref 0–35)
AST: 10 U/L (ref 0–37)
Albumin: 4 g/dL (ref 3.5–5.2)
Alkaline Phosphatase: 52 U/L (ref 39–117)
BUN: 11 mg/dL (ref 6–23)
CO2: 27 mEq/L (ref 19–32)
Calcium: 8.8 mg/dL (ref 8.4–10.5)
Chloride: 110 mEq/L (ref 96–112)
Creatinine, Ser: 1.12 mg/dL (ref 0.40–1.20)
GFR: 53.05 mL/min — ABNORMAL LOW (ref >60.00)
Glucose, Bld: 81 mg/dL (ref 70–99)
Potassium: 4.3 mEq/L (ref 3.5–5.1)
Sodium: 143 mEq/L (ref 135–145)
Total Bilirubin: 0.3 mg/dL (ref 0.2–1.2)
Total Protein: 6.3 g/dL (ref 6.0–8.3)

## 2019-07-30 LAB — TSH: TSH: 1.33 u[IU]/mL (ref 0.35–4.50)

## 2019-07-30 LAB — LIPID PANEL
Cholesterol: 161 mg/dL (ref 0–200)
HDL: 64.9 mg/dL (ref >39.00)
LDL Cholesterol: 80 mg/dL (ref 0–99)
NonHDL: 95.61
Total CHOL/HDL Ratio: 2
Triglycerides: 80 mg/dL (ref 0.0–149.0)
VLDL: 16 mg/dL (ref 0.0–40.0)

## 2019-07-30 LAB — VITAMIN B12: Vitamin B-12: 208 pg/mL — ABNORMAL LOW (ref 211–911)

## 2019-07-30 LAB — T4, FREE: Free T4: 0.68 ng/dL (ref 0.60–1.60)

## 2019-07-30 NOTE — Assessment & Plan Note (Signed)
Managed with Tylenol prn Will monitor

## 2019-07-30 NOTE — Assessment & Plan Note (Signed)
B12 level today Continue B12 injections

## 2019-07-30 NOTE — Assessment & Plan Note (Signed)
Continue Mirapex.

## 2019-07-30 NOTE — Addendum Note (Signed)
Addended by: Jearld Fenton on: 07/30/2019 01:11 PM   Modules accepted: Orders

## 2019-07-30 NOTE — Assessment & Plan Note (Signed)
Continue Topamax RX for Imitrex 25 mg to take as needed for breakthrough Will monitor

## 2019-07-30 NOTE — Assessment & Plan Note (Signed)
CMET and Lipid profile today Continue Amlodipine, Carvedilol, ASA, Atorvastatin and Ranexa She will continue to follow with cardiology

## 2019-07-30 NOTE — Assessment & Plan Note (Signed)
Continue Venlafaxine, Buspar, Seroquel and Hydroxyzine Will increase Buspar to 1-2 tabs TID prn Support offered today Continue to follow with Dr. Rexene Edison

## 2019-07-31 ENCOUNTER — Encounter: Payer: Self-pay | Admitting: Internal Medicine

## 2019-08-04 ENCOUNTER — Encounter: Payer: Self-pay | Admitting: Internal Medicine

## 2019-09-02 ENCOUNTER — Ambulatory Visit
Admission: RE | Admit: 2019-09-02 | Discharge: 2019-09-02 | Disposition: A | Discharge status: Home / Self Care | Payer: BC Managed Care – PPO | Source: Ambulatory Visit | Attending: Internal Medicine | Admitting: Internal Medicine

## 2019-09-02 DIAGNOSIS — E041 Nontoxic single thyroid nodule: Secondary | ICD-10-CM | POA: Diagnosis not present

## 2019-09-02 DIAGNOSIS — R221 Localized swelling, mass and lump, neck: Secondary | ICD-10-CM

## 2019-09-15 ENCOUNTER — Ambulatory Visit (HOSPITAL_COMMUNITY): Payer: BC Managed Care – PPO | Admitting: Psychology

## 2019-09-15 ENCOUNTER — Other Ambulatory Visit: Payer: Self-pay

## 2019-09-15 ENCOUNTER — Encounter (HOSPITAL_COMMUNITY): Payer: Self-pay | Admitting: Psychology

## 2019-09-15 NOTE — Progress Notes (Signed)
Dana Miller is a 44 y.o. female patient who was scheduled w/ counselor for assessment.  Quickly into appointment- pt shared she is in counseling and seeking psychiatric evaluation for medication.  Counselor informed that need to get on psychiatrist schedule and sorry for confusion re: this appointment being scheduled.  Pt understood- confirmed contact information is correct and to be expecting a call from our staff to set up that appointment.Marland Kitchen        Forde Radon, [facility]

## 2019-09-22 ENCOUNTER — Other Ambulatory Visit: Payer: Self-pay | Admitting: Internal Medicine

## 2019-09-22 DIAGNOSIS — G43819 Other migraine, intractable, without status migrainosus: Secondary | ICD-10-CM

## 2019-09-26 IMAGING — DX DG FOOT COMPLETE 3+V*R*
3 series · 3 of 3 positions shown · non-contrast
Comparison: 06/06/2015

CLINICAL DATA: Right foot pain, no known injury, initial encounter

EXAM:
RIGHT FOOT COMPLETE - 3+ VIEW

[foot ap]
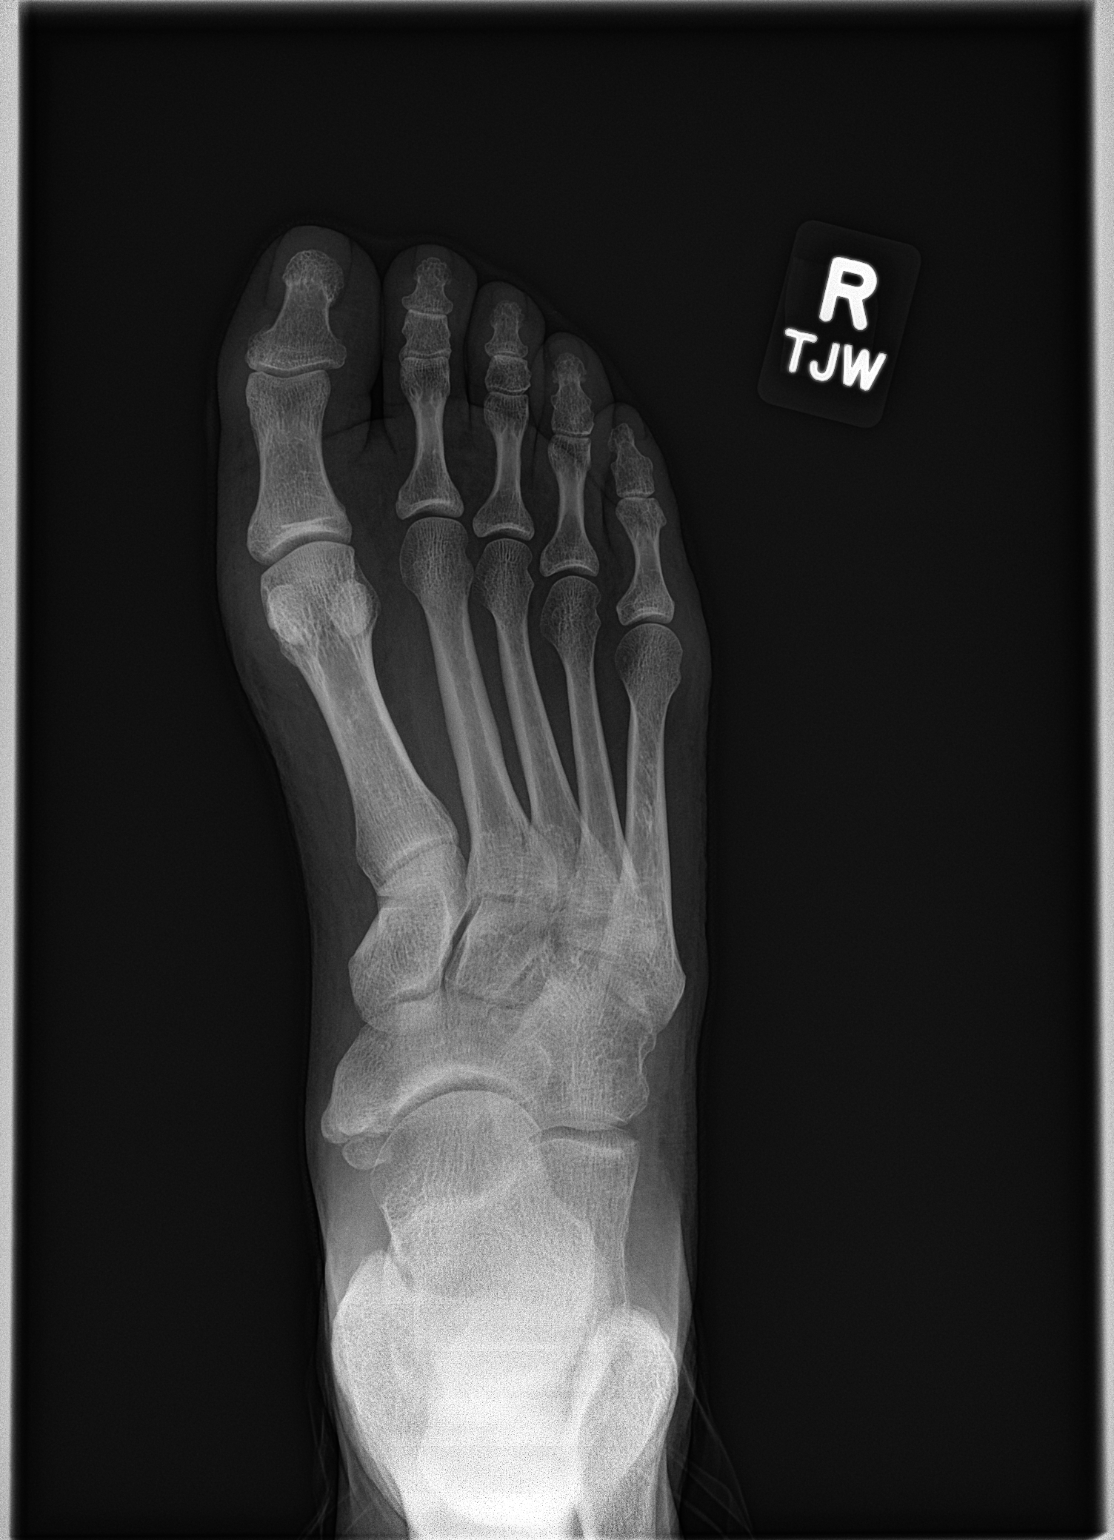

[foot obl]
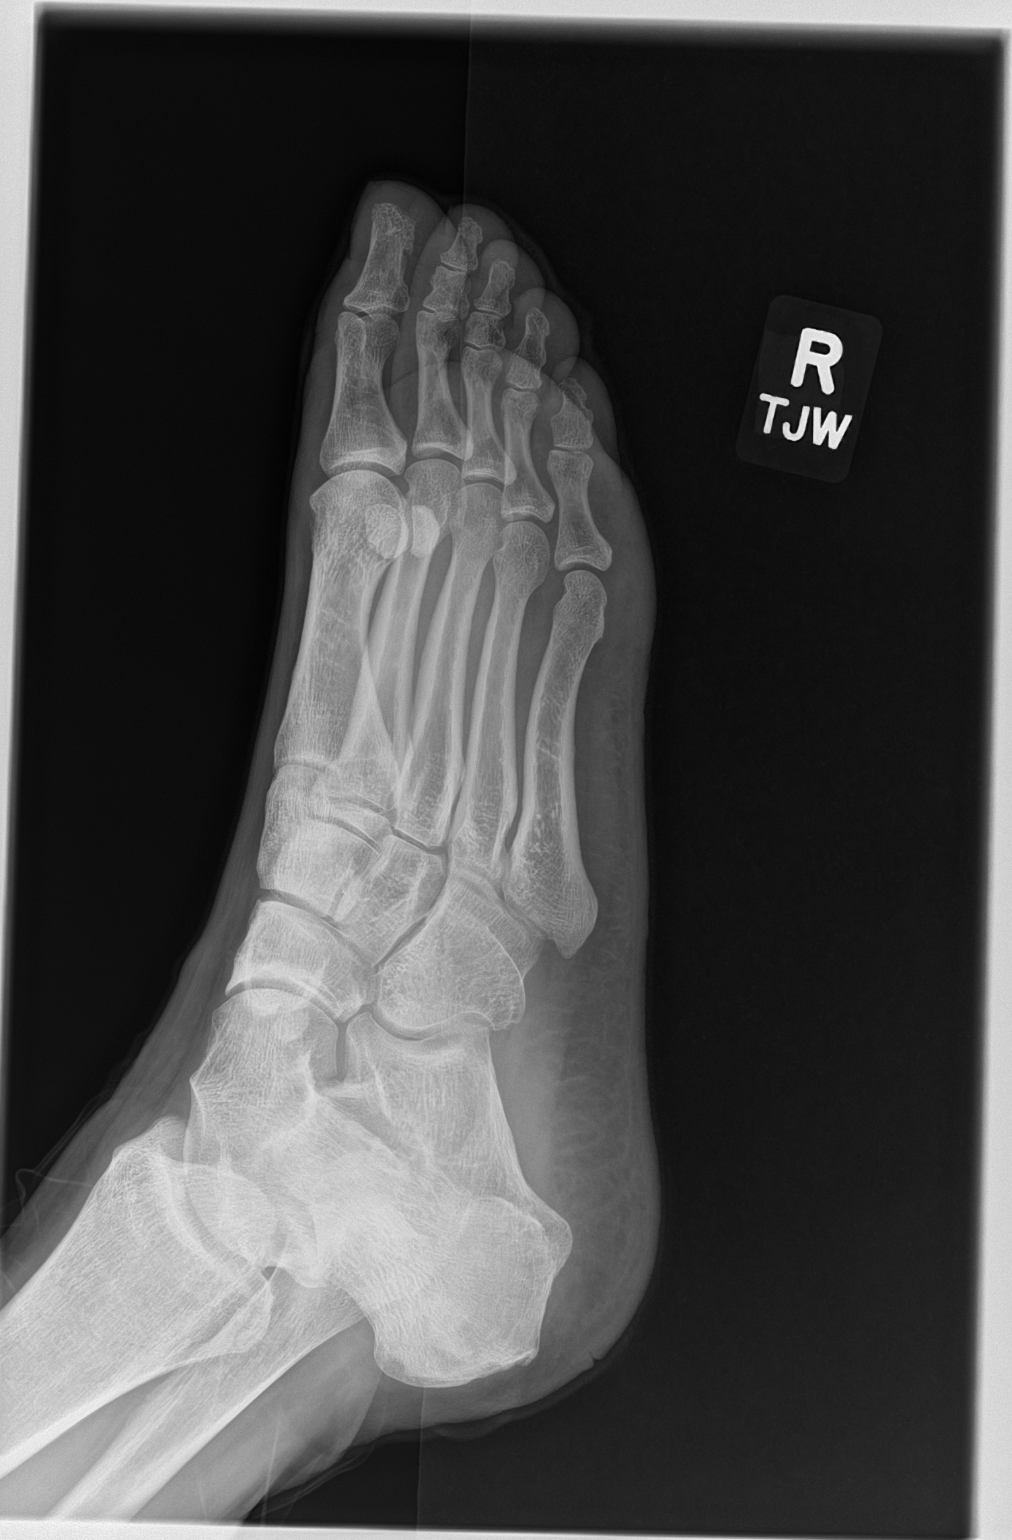

[foot lat]
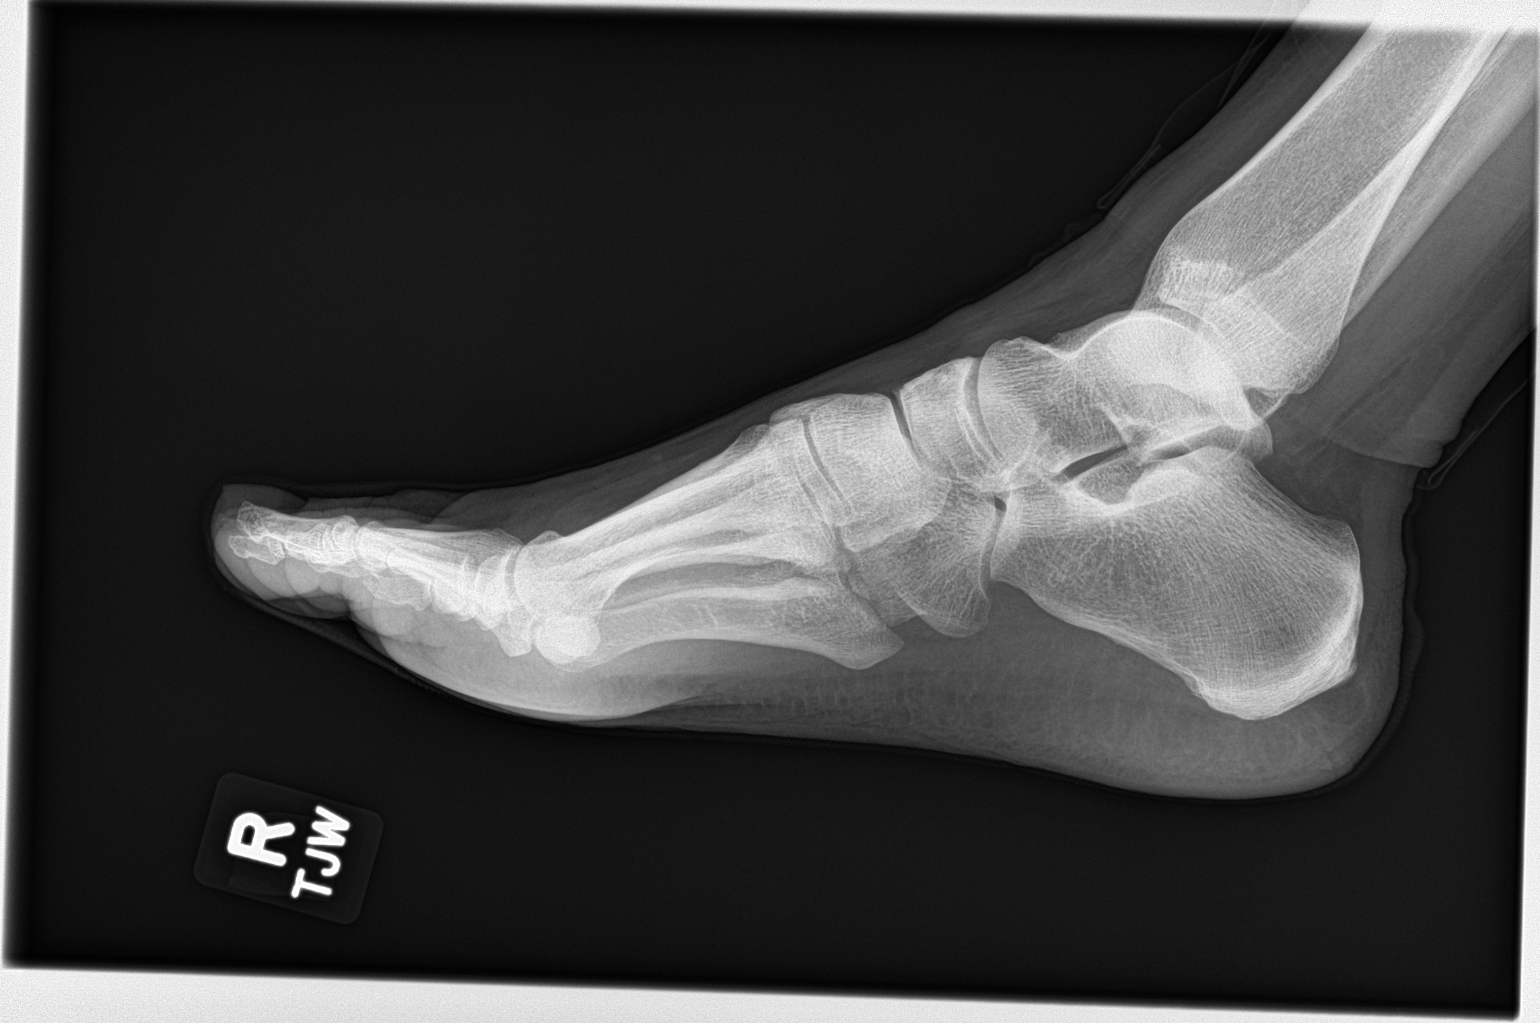

[3 of 3 positions shown; findings below may reference images not displayed]

FINDINGS: There is no evidence of fracture or dislocation. There is no
evidence of arthropathy or other focal bone abnormality. Soft
tissues are unremarkable.
IMPRESSION: No acute abnormality noted.

## 2019-10-14 ENCOUNTER — Other Ambulatory Visit: Payer: Self-pay | Admitting: Internal Medicine

## 2019-10-14 DIAGNOSIS — F419 Anxiety disorder, unspecified: Secondary | ICD-10-CM

## 2019-10-15 ENCOUNTER — Other Ambulatory Visit: Payer: Self-pay | Admitting: Internal Medicine

## 2019-10-15 NOTE — Telephone Encounter (Signed)
Imitrex last filled 07/28/2020...Marland Kitchen please advise

## 2019-10-31 ENCOUNTER — Other Ambulatory Visit: Payer: Self-pay | Admitting: Internal Medicine

## 2019-11-10 ENCOUNTER — Encounter: Payer: Self-pay | Admitting: Internal Medicine

## 2019-11-10 MED ORDER — BUSPIRONE HCL 5 MG PO TABS: ORAL_TABLET | ORAL | 1 refills | Status: DC

## 2019-12-10 ENCOUNTER — Other Ambulatory Visit: Payer: Self-pay | Admitting: Internal Medicine

## 2019-12-10 DIAGNOSIS — R252 Cramp and spasm: Secondary | ICD-10-CM

## 2019-12-10 DIAGNOSIS — F419 Anxiety disorder, unspecified: Secondary | ICD-10-CM

## 2019-12-11 MED ORDER — PRAMIPEXOLE DIHYDROCHLORIDE 0.5 MG PO TABS: 0.5000 mg | ORAL_TABLET | Freq: Every day | ORAL | 0 refills | Status: DC

## 2019-12-11 MED ORDER — HYDROXYZINE HCL 10 MG PO TABS: 10.0000 mg | ORAL_TABLET | Freq: Two times a day (BID) | ORAL | 2 refills | Status: DC | PRN

## 2019-12-23 ENCOUNTER — Encounter: Payer: Self-pay | Admitting: Internal Medicine

## 2020-01-20 ENCOUNTER — Other Ambulatory Visit: Payer: Self-pay | Admitting: Internal Medicine

## 2020-01-20 DIAGNOSIS — R252 Cramp and spasm: Secondary | ICD-10-CM

## 2020-03-30 ENCOUNTER — Other Ambulatory Visit: Payer: Self-pay | Admitting: Internal Medicine

## 2020-03-30 DIAGNOSIS — F419 Anxiety disorder, unspecified: Secondary | ICD-10-CM

## 2020-04-19 ENCOUNTER — Other Ambulatory Visit: Payer: Self-pay | Admitting: Internal Medicine

## 2020-04-21 NOTE — Telephone Encounter (Signed)
Last filled 10/15/2019.... please advise  

## 2020-05-29 ENCOUNTER — Other Ambulatory Visit: Payer: Self-pay | Admitting: Internal Medicine

## 2020-05-29 DIAGNOSIS — R252 Cramp and spasm: Secondary | ICD-10-CM

## 2020-07-02 DIAGNOSIS — Z23 Encounter for immunization: Secondary | ICD-10-CM | POA: Diagnosis not present

## 2020-07-02 DIAGNOSIS — F17200 Nicotine dependence, unspecified, uncomplicated: Secondary | ICD-10-CM | POA: Diagnosis not present

## 2020-07-02 DIAGNOSIS — E782 Mixed hyperlipidemia: Secondary | ICD-10-CM | POA: Diagnosis not present

## 2020-07-02 DIAGNOSIS — I201 Angina pectoris with documented spasm: Secondary | ICD-10-CM | POA: Diagnosis not present

## 2020-07-02 DIAGNOSIS — I2589 Other forms of chronic ischemic heart disease: Secondary | ICD-10-CM | POA: Diagnosis not present

## 2020-07-14 ENCOUNTER — Encounter: Payer: Self-pay | Admitting: Internal Medicine

## 2020-07-14 NOTE — Telephone Encounter (Signed)
I was able to reach patient's husband on the phone today and warn him ahead of time for both he and patients appts that their annual exams were not due until after 07/28/20 so there is a chance his insurance may not cover the appointments for them today and tomorrow.   Husband states that he knows that it will be covered and they will be fine to keep appts as scheduled.  (for both he and his wife).  He does not want to reschedule for after the 12/15 date as he knows we may not be able to get them back in before the years end and they need to be seen now.   I verbalized understanding and he verbalized understanding and willingness to accept risk that insurance may not cover.  (Husband states that he believes his insurance will cover their appointments).

## 2020-07-15 ENCOUNTER — Encounter: Payer: Self-pay | Admitting: Internal Medicine

## 2020-07-15 ENCOUNTER — Other Ambulatory Visit: Payer: Self-pay

## 2020-07-15 ENCOUNTER — Ambulatory Visit (INDEPENDENT_AMBULATORY_CARE_PROVIDER_SITE_OTHER): Payer: BC Managed Care – PPO | Admitting: Internal Medicine

## 2020-07-15 VITALS — BP 122/82 | HR 88 | Temp 97.5°F | Ht 65.75 in | Wt 198.0 lb

## 2020-07-15 DIAGNOSIS — I2589 Other forms of chronic ischemic heart disease: Secondary | ICD-10-CM

## 2020-07-15 DIAGNOSIS — Z0000 Encounter for general adult medical examination without abnormal findings: Secondary | ICD-10-CM

## 2020-07-15 DIAGNOSIS — G43819 Other migraine, intractable, without status migrainosus: Secondary | ICD-10-CM | POA: Diagnosis not present

## 2020-07-15 DIAGNOSIS — G2581 Restless legs syndrome: Secondary | ICD-10-CM | POA: Diagnosis not present

## 2020-07-15 DIAGNOSIS — R29898 Other symptoms and signs involving the musculoskeletal system: Secondary | ICD-10-CM

## 2020-07-15 DIAGNOSIS — Z1159 Encounter for screening for other viral diseases: Secondary | ICD-10-CM

## 2020-07-15 DIAGNOSIS — R29818 Other symptoms and signs involving the nervous system: Secondary | ICD-10-CM | POA: Diagnosis not present

## 2020-07-15 DIAGNOSIS — F419 Anxiety disorder, unspecified: Secondary | ICD-10-CM

## 2020-07-15 DIAGNOSIS — E041 Nontoxic single thyroid nodule: Secondary | ICD-10-CM

## 2020-07-15 DIAGNOSIS — F32A Depression, unspecified: Secondary | ICD-10-CM

## 2020-07-15 NOTE — Progress Notes (Signed)
HPI:  Pt presents to the clinic today for her subsequent annual Medicare Wellness Exam. She is also due to follow up chronic conditions.  Anxiety and Depression: Deteriorated after the loss of parents earlier in the year. Currently managed on Venlafaxine, Buspar, Seroquel and Hydroxyzine. 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 She is not currently seeing a therapist or psychiatrist. She denies SI/HI.  Chronic Microvascular Disease: Her BP today is 122/82. She is taking Amlodipine, Carvedilol, ASA, Atorvastatin and Ranexa as prescribed. She follows with cardiology.  Migraines: These occur a few times per week, triggered by stress. She is taking Topamax as prescribed. She is not taking anything for breakthrough at this time. She does not follow wit neurology.  RLS: Managed with Mirapex with good relief of symptoms.   Past Medical History:  Diagnosis Date  . Anxiety   . Cardiac microvascular disease   . Chicken pox   . Depression   . Thyroid mass    right side    Current Outpatient Medications  Medication Sig Dispense Refill  . amLODipine (NORVASC) 10 MG tablet Take by mouth.    Marland Kitchen aspirin EC 81 MG tablet Take 1 tablet by mouth daily.    Marland Kitchen atorvastatin (LIPITOR) 20 MG tablet Take 1 tablet by mouth daily.  11  . busPIRone (BUSPAR) 5 MG tablet TAKE 1 TABLET(5 MG) BY MOUTH TWICE DAILY 180 tablet 1  . carvedilol (COREG) 25 MG tablet Take 0.5 tablets by mouth daily at 8 pm.     . EPINEPHrine 0.3 mg/0.3 mL IJ SOAJ injection Inject 0.3 mLs (0.3 mg total) into the muscle as needed. For Anaphylaxis 1 Device 0  . fexofenadine (ALLEGRA) 180 MG tablet Take 1 tablet by mouth daily.    . hydrOXYzine (ATARAX/VISTARIL) 10 MG tablet TAKE 1 TABLET(10 MG) BY MOUTH TWICE DAILY AS NEEDED 60 tablet 2  . nitroGLYCERIN (NITROSTAT) 0.4 MG SL tablet Place 0.4 mg under the tongue every 5 (five) minutes as needed for chest pain.    . pramipexole  (MIRAPEX) 0.5 MG tablet TAKE 1 TABLET(0.5 MG) BY MOUTH AT BEDTIME 90 tablet 0  . QUEtiapine (SEROQUEL) 50 MG tablet TAKE 1/2 TO 1 TABLET(25 TO 50 MG) BY MOUTH TWICE DAILY 180 tablet 0  . ranolazine (RANEXA) 500 MG 12 hr tablet Take 2 tablets by mouth daily.  3  . Selenium Sulfide 2.25 % SHAM Apply 1 application topically daily as needed. 1 Bottle 3  . SUMAtriptan (IMITREX) 25 MG tablet TAKE 1 TABLET( 25 MG TOTAL) BY MOUTH EVERY 2 HOURS AS NEEDED FOR MIGRAINE. MAY REPEAT IN 2 HOURS IF HEADACHE PERSISTS OR RECURS 10 tablet 0  . topiramate (TOPAMAX) 100 MG tablet TAKE 1 TABLET(100 MG) BY MOUTH DAILY 30 tablet 5  . venlafaxine XR (EFFEXOR-XR) 75 MG 24 hr capsule TAKE 3 CAPSULES(225 MG) BY MOUTH DAILY WITH BREAKFAST 270 capsule 0   No current facility-administered medications for this visit.    Allergies  Allergen Reactions  . Fentanyl Swelling  . Mirtazapine Swelling  . Penicillins Anaphylaxis  . Sulfa Antibiotics Hives  . Ms Contin  [Morphine] Swelling    Family History  Problem Relation Age of Onset  . Stroke Father   . Cancer Father        Kidney   . AAA (abdominal aortic aneurysm) Father   . Depression Sister   . Anxiety disorder Sister   . Cancer Paternal Uncle  Lung  . Depression Maternal Grandmother   . Heart disease Paternal Grandmother   . Stroke Paternal Grandmother   . Diabetes Paternal Grandmother   . Diabetes Paternal Grandfather     Social History   Socioeconomic History  . Marital status: Married    Spouse name: Not on file  . Number of children: Not on file  . Years of education: Not on file  . Highest education level: Not on file  Occupational History  . Not on file  Tobacco Use  . Smoking status: Current Every Day Smoker    Packs/day: 0.50    Types: Cigarettes  . Smokeless tobacco: Never Used  Substance and Sexual Activity  . Alcohol use: Yes    Alcohol/week: 0.0 standard drinks    Comment: occasional  . Drug use: No  . Sexual activity: Yes   Other Topics Concern  . Not on file  Social History Narrative  . Not on file   Social Determinants of Health   Financial Resource Strain:   . Difficulty of Paying Living Expenses: Not on file  Food Insecurity:   . Worried About Running Out of FoProgramme researcher, broadcasting/film/videood in the Last Year: Not on file  . Ran Out of Food in the Last Year: Not on file  Transportation Needs:   . Lack of Transportation (Medical): Not on file  . Lack of Transportation (Non-Medical): Not on file  Physical Activity:   . Days of Exercise per Week: Not on file  . Minutes of Exercise per Session: Not on file  Stress:   . Feeling of Stress : Not on file  Social Connections:   . Frequency of Communication with Friends and Family: Not on file  . Frequency of Social Gatherings with Friends and Family: Not on file  . Attends Religious Services: Not on file  . Active Member of Clubs or Organizations: Not on file  . Attends BankerClub or Organization Meetings: Not on file  . Marital Status: Not on file  Intimate Partner Violence:   . Fear of Current or Ex-Partner: Not on file  . Emotionally Abused: Not on file  . Physically Abused: Not on file  . Sexually Abused: Not on file    Hospitiliaztions: None  Health Maintenance:    Flu: 04/2019  Tetanus: 12/2014  Covid: never  Mammogram: never  Pap Smear: 12/2014  Eye Doctor: as needed  Dental Exam: biannually   Providers:   PCP: Nicki Reaperegina Meryle Pugmire, NP  Cardiologist: [facility]   I have personally reviewed and have noted:  1. The patient's medical and social history 2. Their use of alcohol, tobacco or illicit drugs 3. Their current medications and supplements 4. The patient's functional ability including ADL's, fall risks, home safety risks and hearing or visual impairment. 5. Diet and physical activities 6. Evidence for depression or mood disorder  Subjective:   Review of Systems:   Constitutional: Pt reports fatigue, headache and weight gain. Denies fever, malaise.  HEENT:  Denies eye pain, eye redness, ear pain, ringing in the ears, wax buildup, runny nose, nasal congestion, bloody nose, or sore throat. Respiratory: Denies difficulty breathing, shortness of breath, cough or sputum production.   Cardiovascular: Pt reports chronic chest pain. Denies chest pain, chest tightness, palpitations or swelling in the hands or feet.  Gastrointestinal: Denies abdominal pain, bloating, constipation, diarrhea or blood in the stool.  GU: Denies urgency, frequency, pain with urination, burning sensation, blood in urine, odor or discharge. Musculoskeletal: Pt reports chronic chest pain. Denies decrease  in range of motion, difficulty with gait, muscle pain or joint pain and swelling.  Skin: Denies redness, rashes, lesions or ulcercations.  Neurological: Pt reports restless legs, difficulty with fine motor skills. Denies dizziness, difficulty with memory, difficulty with speech or problems with balance and coordination.  Psych: Pt has a history of anxiety and depression, audio hallucinations. Denies SI/HI.  No other specific complaints in a complete review of systems (except as listed in HPI above).  Objective:  PE:   BP 122/82   Pulse 88   Temp (!) 97.5 F (36.4 C) (Temporal)   Ht 5' 5.75" (1.67 m)   Wt 198 lb (89.8 kg)   SpO2 99%   BMI 32.20 kg/m   Wt Readings from Last 3 Encounters:  07/29/19 169 lb (76.7 kg)  05/01/19 173 lb (78.5 kg)  03/11/19 168 lb (76.2 kg)    General: Appears her stated age, obese, in NAD. Skin: Warm, dry and intact. No rashes noted. HEENT: Head: normal shape and size; Eyes: sclera white, no icterus, conjunctiva pink, PERRLA and EOMs intact;  Neck: Neck supple, trachea midline. No masses, lumps or thyromegaly present.  Cardiovascular: Normal rate and rhythm. S1,S2 noted.  No murmur, rubs or gallops noted. No JVD or BLE edema.  Pulmonary/Chest: Normal effort and positive vesicular breath sounds. No respiratory distress. No wheezes, rales or  ronchi noted.  Abdomen: Soft and nontender. Normal bowel sounds. No distention or masses noted. Liver, spleen and kidneys non palpable. Musculoskeletal: Strength 5/5 BUE/BLE. No signs of joint swelling.  Neurological: Alert and oriented. Cranial nerves II-XII grossly intact. Coordination normal. Negative Romberg. Psychiatric: Mood and affect flat. Behavior is normal. Judgment and thought content normal.    BMET    Component Value Date/Time   NA 143 07/29/2019 1506   K 4.3 07/29/2019 1506   CL 110 07/29/2019 1506   CO2 27 07/29/2019 1506   GLUCOSE 81 07/29/2019 1506   BUN 11 07/29/2019 1506   CREATININE 1.12 07/29/2019 1506   CALCIUM 8.8 07/29/2019 1506    Lipid Panel     Component Value Date/Time   CHOL 161 07/29/2019 1506   TRIG 80.0 07/29/2019 1506   HDL 64.90 07/29/2019 1506   CHOLHDL 2 07/29/2019 1506   VLDL 16.0 07/29/2019 1506   LDLCALC 80 07/29/2019 1506    CBC    Component Value Date/Time   WBC 10.5 03/11/2019 0928   RBC 3.85 (L) 03/11/2019 0928   HGB 13.4 03/11/2019 0928   HCT 40.1 03/11/2019 0928   PLT 313.0 03/11/2019 0928   MCV 104.3 (H) 03/11/2019 0928   MCHC 33.5 03/11/2019 0928   RDW 13.8 03/11/2019 0928   LYMPHSABS 1.4 07/20/2015 0926   MONOABS 0.5 07/20/2015 0926   EOSABS 0.3 07/20/2015 0926   BASOSABS 0.0 07/20/2015 0926    Hgb A1C Lab Results  Component Value Date   HGBA1C 5.4 12/12/2018      Assessment and Plan:   Medicare Annual Wellness Visit:  Diet: She does eat meat. She consumes fruits and veggies. She tries to avoid fried foods. Physical activity: Walking, coaching Depression/mood screen: Negative, PHQ 9 score of 0 Hearing: Intact to whispered voice Visual acuity: Grossly normal ADLs: Capable Fall risk: None Home safety: Good Cognitive evaluation: Intact to orientation, naming, recall and repetition EOL planning: No adv directives, full code/ I agree  Preventative Medicine: Flu vaccine UTD. Tetanus UTD. She declines  Covid vaccine at this time. Pap smear due 2023. She will start screening mammograms at  45 as well as colon cancer screening. Encouraged her to consume a balanced diet and exercise regimen. Advised her to see an eye doctor and dentist annually. Will check CBC, CMET, , TSH, A1C, Vit D, Vit B12 today. Due dates for screening exam given to patient as part of her AVS.  Thyroid Nodule:  TSH today Thyroid ultrasound ordered  Next appointment: 1 year AWV, sooner if needed   Nicki Reaper, NP This visit occurred during the SARS-CoV-2 public health emergency.  Safety protocols were in place, including screening questions prior to the visit, additional usage of staff PPE, and extensive cleaning of exam room while observing appropriate contact time as indicated for disinfecting solutions.

## 2020-07-15 NOTE — Patient Instructions (Signed)
Health Maintenance, Female Adopting a healthy lifestyle and getting preventive care are important in promoting health and wellness. Ask your health care provider about:  The right schedule for you to have regular tests and exams.  Things you can do on your own to prevent diseases and keep yourself healthy. What should I know about diet, weight, and exercise? Eat a healthy diet   Eat a diet that includes plenty of vegetables, fruits, low-fat dairy products, and lean protein.  Do not eat a lot of foods that are high in solid fats, added sugars, or sodium. Maintain a healthy weight Body mass index (BMI) is used to identify weight problems. It estimates body fat based on height and weight. Your health care provider can help determine your BMI and help you achieve or maintain a healthy weight. Get regular exercise Get regular exercise. This is one of the most important things you can do for your health. Most adults should:  Exercise for at least 150 minutes each week. The exercise should increase your heart rate and make you sweat (moderate-intensity exercise).  Do strengthening exercises at least twice a week. This is in addition to the moderate-intensity exercise.  Spend less time sitting. Even light physical activity can be beneficial. Watch cholesterol and blood lipids Have your blood tested for lipids and cholesterol at 44 years of age, then have this test every 5 years. Have your cholesterol levels checked more often if:  Your lipid or cholesterol levels are high.  You are older than 44 years of age.  You are at high risk for heart disease. What should I know about cancer screening? Depending on your health history and family history, you may need to have cancer screening at various ages. This may include screening for:  Breast cancer.  Cervical cancer.  Colorectal cancer.  Skin cancer.  Lung cancer. What should I know about heart disease, diabetes, and high blood  pressure? Blood pressure and heart disease  High blood pressure causes heart disease and increases the risk of stroke. This is more likely to develop in people who have high blood pressure readings, are of African descent, or are overweight.  Have your blood pressure checked: ? Every 3-5 years if you are 18-39 years of age. ? Every year if you are 40 years old or older. Diabetes Have regular diabetes screenings. This checks your fasting blood sugar level. Have the screening done:  Once every three years after age 40 if you are at a normal weight and have a low risk for diabetes.  More often and at a younger age if you are overweight or have a high risk for diabetes. What should I know about preventing infection? Hepatitis B If you have a higher risk for hepatitis B, you should be screened for this virus. Talk with your health care provider to find out if you are at risk for hepatitis B infection. Hepatitis C Testing is recommended for:  Everyone born from 1945 through 1965.  Anyone with known risk factors for hepatitis C. Sexually transmitted infections (STIs)  Get screened for STIs, including gonorrhea and chlamydia, if: ? You are sexually active and are younger than 44 years of age. ? You are older than 44 years of age and your health care provider tells you that you are at risk for this type of infection. ? Your sexual activity has changed since you were last screened, and you are at increased risk for chlamydia or gonorrhea. Ask your health care provider if   you are at risk.  Ask your health care provider about whether you are at high risk for HIV. Your health care provider may recommend a prescription medicine to help prevent HIV infection. If you choose to take medicine to prevent HIV, you should first get tested for HIV. You should then be tested every 3 months for as long as you are taking the medicine. Pregnancy  If you are about to stop having your period (premenopausal) and  you may become pregnant, seek counseling before you get pregnant.  Take 400 to 800 micrograms (mcg) of folic acid every day if you become pregnant.  Ask for birth control (contraception) if you want to prevent pregnancy. Osteoporosis and menopause Osteoporosis is a disease in which the bones lose minerals and strength with aging. This can result in bone fractures. If you are 65 years old or older, or if you are at risk for osteoporosis and fractures, ask your health care provider if you should:  Be screened for bone loss.  Take a calcium or vitamin D supplement to lower your risk of fractures.  Be given hormone replacement therapy (HRT) to treat symptoms of menopause. Follow these instructions at home: Lifestyle  Do not use any products that contain nicotine or tobacco, such as cigarettes, e-cigarettes, and chewing tobacco. If you need help quitting, ask your health care provider.  Do not use street drugs.  Do not share needles.  Ask your health care provider for help if you need support or information about quitting drugs. Alcohol use  Do not drink alcohol if: ? Your health care provider tells you not to drink. ? You are pregnant, may be pregnant, or are planning to become pregnant.  If you drink alcohol: ? Limit how much you use to 0-1 drink a day. ? Limit intake if you are breastfeeding.  Be aware of how much alcohol is in your drink. In the [place] one drink equals one 12 oz bottle of beer (355 mL), one 5 oz glass of wine (148 mL), or one 1 oz glass of hard liquor (44 mL). General instructions  Schedule regular health, dental, and eye exams.  Stay current with your vaccines.  Tell your health care provider if: ? You often feel depressed. ? You have ever been abused or do not feel safe at home. Summary  Adopting a healthy lifestyle and getting preventive care are important in promoting health and wellness.  Follow your health care provider's instructions about healthy  diet, exercising, and getting tested or screened for diseases.  Follow your health care provider's instructions on monitoring your cholesterol and blood pressure. This information is not intended to replace advice given to you by your health care provider. Make sure you discuss any questions you have with your health care provider. Document Revised: 07/24/2018 Document Reviewed: 07/24/2018 Elsevier Patient Education  2020 [employer]  

## 2020-07-16 ENCOUNTER — Encounter: Payer: Self-pay | Admitting: Internal Medicine

## 2020-07-16 LAB — COMPREHENSIVE METABOLIC PANEL
ALT: 8 U/L (ref 0–35)
AST: 12 U/L (ref 0–37)
Albumin: 4.4 g/dL (ref 3.5–5.2)
Alkaline Phosphatase: 53 U/L (ref 39–117)
BUN: 17 mg/dL (ref 6–23)
CO2: 24 mEq/L (ref 19–32)
Calcium: 9.3 mg/dL (ref 8.4–10.5)
Chloride: 107 mEq/L (ref 96–112)
Creatinine, Ser: 1.31 mg/dL — ABNORMAL HIGH (ref 0.40–1.20)
GFR: 49.68 mL/min — ABNORMAL LOW (ref >60.00)
Glucose, Bld: 94 mg/dL (ref 70–99)
Potassium: 4.7 mEq/L (ref 3.5–5.1)
Sodium: 140 mEq/L (ref 135–145)
Total Bilirubin: 0.5 mg/dL (ref 0.2–1.2)
Total Protein: 7.3 g/dL (ref 6.0–8.3)

## 2020-07-16 LAB — HEPATITIS C ANTIBODY
Hepatitis C Ab: NONREACTIVE
SIGNAL TO CUT-OFF: 0.01 (ref <1.00)

## 2020-07-16 LAB — LIPID PANEL
Cholesterol: 240 mg/dL — ABNORMAL HIGH (ref 0–200)
HDL: 87 mg/dL (ref >39.00)
LDL Cholesterol: 134 mg/dL — ABNORMAL HIGH (ref 0–99)
NonHDL: 153.41
Total CHOL/HDL Ratio: 3
Triglycerides: 98 mg/dL (ref 0.0–149.0)
VLDL: 19.6 mg/dL (ref 0.0–40.0)

## 2020-07-16 LAB — CBC
HCT: 41.3 % (ref 36.0–46.0)
Hemoglobin: 13.9 g/dL (ref 12.0–15.0)
MCHC: 33.7 g/dL (ref 30.0–36.0)
MCV: 101.2 fl — ABNORMAL HIGH (ref 78.0–100.0)
Platelets: 344 10*3/uL (ref 150.0–400.0)
RBC: 4.08 Mil/uL (ref 3.87–5.11)
RDW: 13.6 % (ref 11.5–15.5)
WBC: 6.7 10*3/uL (ref 4.0–10.5)

## 2020-07-16 LAB — HEMOGLOBIN A1C: Hgb A1c MFr Bld: 5.1 % (ref 4.6–6.5)

## 2020-07-16 LAB — TSH: TSH: 2.34 u[IU]/mL (ref 0.35–4.50)

## 2020-07-16 LAB — VITAMIN D 25 HYDROXY (VIT D DEFICIENCY, FRACTURES): VITD: 21.24 ng/mL — ABNORMAL LOW (ref 30.00–100.00)

## 2020-07-16 LAB — VITAMIN B12: Vitamin B-12: 176 pg/mL — ABNORMAL LOW (ref 211–911)

## 2020-07-16 NOTE — Assessment & Plan Note (Signed)
Stable with Mirapex Will monitor

## 2020-07-16 NOTE — Assessment & Plan Note (Signed)
Continue Amlodipine, Carvedilol, ASA, Atorvastatin and Ranexa Encouraged smoking cessation She will continue to follow with cardiology

## 2020-07-16 NOTE — Assessment & Plan Note (Signed)
Deteriorated likely due to stress MRI Brain ordered Continue Topamax for now Consider triptan therapy for breakthrough

## 2020-07-16 NOTE — Assessment & Plan Note (Signed)
Deteriorated Given her issues, will obtain MRI Brain Referral to psychiatry placed Continue Venlafaxine, Buspar, Seroquel and Hydroxyzine Consider increasing dose of Seroquel due to insomina and audio hallucinations.

## 2020-07-30 ENCOUNTER — Ambulatory Visit
Admission: RE | Admit: 2020-07-30 | Discharge: 2020-07-30 | Disposition: A | Discharge status: Home / Self Care | Payer: BC Managed Care – PPO | Source: Ambulatory Visit | Attending: Internal Medicine | Admitting: Internal Medicine

## 2020-07-30 ENCOUNTER — Telehealth: Payer: Self-pay

## 2020-07-30 DIAGNOSIS — E041 Nontoxic single thyroid nodule: Secondary | ICD-10-CM

## 2020-07-30 NOTE — Telephone Encounter (Signed)
Wellness screening form has been faxed, original mailed to pt and copy sent to be scanned

## 2020-08-03 ENCOUNTER — Other Ambulatory Visit: Payer: Self-pay

## 2020-08-03 ENCOUNTER — Ambulatory Visit
Admission: RE | Admit: 2020-08-03 | Discharge: 2020-08-03 | Disposition: A | Discharge status: Home / Self Care | Payer: BC Managed Care – PPO | Source: Ambulatory Visit | Attending: Internal Medicine | Admitting: Internal Medicine

## 2020-08-03 DIAGNOSIS — G43819 Other migraine, intractable, without status migrainosus: Secondary | ICD-10-CM

## 2020-08-03 DIAGNOSIS — H539 Unspecified visual disturbance: Secondary | ICD-10-CM | POA: Diagnosis not present

## 2020-08-03 DIAGNOSIS — H748X3 Other specified disorders of middle ear and mastoid, bilateral: Secondary | ICD-10-CM | POA: Diagnosis not present

## 2020-08-03 DIAGNOSIS — R519 Headache, unspecified: Secondary | ICD-10-CM | POA: Diagnosis not present

## 2020-08-03 DIAGNOSIS — R29818 Other symptoms and signs involving the nervous system: Secondary | ICD-10-CM

## 2020-08-05 ENCOUNTER — Telehealth: Payer: Self-pay

## 2020-08-05 DIAGNOSIS — T447X2A Poisoning by beta-adrenoreceptor antagonists, intentional self-harm, initial encounter: Secondary | ICD-10-CM | POA: Diagnosis not present

## 2020-08-05 DIAGNOSIS — F332 Major depressive disorder, recurrent severe without psychotic features: Secondary | ICD-10-CM | POA: Diagnosis not present

## 2020-08-05 DIAGNOSIS — Z87891 Personal history of nicotine dependence: Secondary | ICD-10-CM | POA: Diagnosis not present

## 2020-08-05 DIAGNOSIS — K219 Gastro-esophageal reflux disease without esophagitis: Secondary | ICD-10-CM | POA: Diagnosis not present

## 2020-08-05 DIAGNOSIS — F333 Major depressive disorder, recurrent, severe with psychotic symptoms: Secondary | ICD-10-CM | POA: Diagnosis not present

## 2020-08-05 DIAGNOSIS — I2510 Atherosclerotic heart disease of native coronary artery without angina pectoris: Secondary | ICD-10-CM | POA: Diagnosis not present

## 2020-08-05 DIAGNOSIS — Y906 Blood alcohol level of 120-199 mg/100 ml: Secondary | ICD-10-CM | POA: Diagnosis not present

## 2020-08-05 DIAGNOSIS — F1290 Cannabis use, unspecified, uncomplicated: Secondary | ICD-10-CM | POA: Diagnosis not present

## 2020-08-05 DIAGNOSIS — F419 Anxiety disorder, unspecified: Secondary | ICD-10-CM | POA: Diagnosis not present

## 2020-08-05 DIAGNOSIS — Z20822 Contact with and (suspected) exposure to covid-19: Secondary | ICD-10-CM | POA: Diagnosis not present

## 2020-08-05 DIAGNOSIS — T1491XA Suicide attempt, initial encounter: Secondary | ICD-10-CM | POA: Diagnosis not present

## 2020-08-05 DIAGNOSIS — Z3202 Encounter for pregnancy test, result negative: Secondary | ICD-10-CM | POA: Diagnosis not present

## 2020-08-05 DIAGNOSIS — E785 Hyperlipidemia, unspecified: Secondary | ICD-10-CM | POA: Diagnosis not present

## 2020-08-05 DIAGNOSIS — I2589 Other forms of chronic ischemic heart disease: Secondary | ICD-10-CM | POA: Diagnosis not present

## 2020-08-05 DIAGNOSIS — Z98891 History of uterine scar from previous surgery: Secondary | ICD-10-CM | POA: Diagnosis not present

## 2020-08-05 DIAGNOSIS — T50901A Poisoning by unspecified drugs, medicaments and biological substances, accidental (unintentional), initial encounter: Secondary | ICD-10-CM | POA: Diagnosis not present

## 2020-08-05 DIAGNOSIS — R45851 Suicidal ideations: Secondary | ICD-10-CM | POA: Diagnosis not present

## 2020-08-05 DIAGNOSIS — J309 Allergic rhinitis, unspecified: Secondary | ICD-10-CM | POA: Diagnosis not present

## 2020-08-05 DIAGNOSIS — G8929 Other chronic pain: Secondary | ICD-10-CM | POA: Diagnosis not present

## 2020-08-05 DIAGNOSIS — R079 Chest pain, unspecified: Secondary | ICD-10-CM | POA: Diagnosis not present

## 2020-08-05 DIAGNOSIS — F4312 Post-traumatic stress disorder, chronic: Secondary | ICD-10-CM | POA: Diagnosis not present

## 2020-08-05 DIAGNOSIS — F411 Generalized anxiety disorder: Secondary | ICD-10-CM | POA: Diagnosis not present

## 2020-08-05 DIAGNOSIS — F4310 Post-traumatic stress disorder, unspecified: Secondary | ICD-10-CM | POA: Diagnosis not present

## 2020-08-05 NOTE — Telephone Encounter (Signed)
Mr Culton calling at 1:55 pm to speak with Pamala Hurry NP. Mr Carrasco said he only wanted to speak with Pamala Hurry NP. Pamala Hurry NP has already left office and Mr Hetzer said this could not wait until 08/09/20. I spoke with Pamala Hurry NP on cell and she will contact Mr Lizarraga by phone. Sending note to Pamala Hurry NP.

## 2020-08-05 NOTE — Telephone Encounter (Signed)
Mr. Galeana contacted by provider, concerns discussed

## 2020-08-16 ENCOUNTER — Encounter: Payer: Medicare (Managed Care) | Admitting: Internal Medicine

## 2020-08-31 ENCOUNTER — Encounter: Payer: Self-pay | Admitting: Internal Medicine

## 2020-08-31 ENCOUNTER — Ambulatory Visit
Admission: EM | Admit: 2020-08-31 | Discharge: 2020-08-31 | Disposition: A | Discharge status: Home / Self Care | Payer: BC Managed Care – PPO

## 2020-10-24 ENCOUNTER — Other Ambulatory Visit: Payer: Self-pay | Admitting: Internal Medicine

## 2020-10-24 DIAGNOSIS — R252 Cramp and spasm: Secondary | ICD-10-CM

## 2020-10-24 DIAGNOSIS — G43819 Other migraine, intractable, without status migrainosus: Secondary | ICD-10-CM

## 2020-11-10 ENCOUNTER — Other Ambulatory Visit: Payer: Self-pay | Admitting: Internal Medicine

## 2020-11-12 MED ORDER — QUETIAPINE FUMARATE 50 MG PO TABS: 25.0000 mg | ORAL_TABLET | Freq: Two times a day (BID) | ORAL | 2 refills | Status: DC

## 2020-12-13 ENCOUNTER — Other Ambulatory Visit: Payer: Self-pay

## 2020-12-13 DIAGNOSIS — R252 Cramp and spasm: Secondary | ICD-10-CM

## 2020-12-14 ENCOUNTER — Encounter: Payer: Self-pay | Admitting: Internal Medicine

## 2020-12-14 DIAGNOSIS — R252 Cramp and spasm: Secondary | ICD-10-CM

## 2020-12-14 MED ORDER — PRAMIPEXOLE DIHYDROCHLORIDE 0.5 MG PO TABS: ORAL_TABLET | ORAL | 1 refills | Status: DC

## 2020-12-14 MED ORDER — VENLAFAXINE HCL ER 75 MG PO CP24: ORAL_CAPSULE | ORAL | 0 refills | Status: DC

## 2020-12-27 ENCOUNTER — Ambulatory Visit (INDEPENDENT_AMBULATORY_CARE_PROVIDER_SITE_OTHER): Payer: BC Managed Care – PPO | Admitting: Psychologist

## 2020-12-27 DIAGNOSIS — F331 Major depressive disorder, recurrent, moderate: Secondary | ICD-10-CM | POA: Diagnosis not present

## 2020-12-27 DIAGNOSIS — F411 Generalized anxiety disorder: Secondary | ICD-10-CM

## 2021-01-11 ENCOUNTER — Ambulatory Visit: Payer: BC Managed Care – PPO | Admitting: Psychologist

## 2021-02-17 ENCOUNTER — Other Ambulatory Visit: Payer: Self-pay | Admitting: Internal Medicine

## 2021-02-17 DIAGNOSIS — F419 Anxiety disorder, unspecified: Secondary | ICD-10-CM

## 2021-02-17 NOTE — Telephone Encounter (Signed)
Medication Refill - Medication: hydrOXYzine (ATARAX/VISTARIL) 10 MG tablet   and busPIRone (BUSPAR) 5 MG tablet   Has the patient contacted their pharmacy? yes (Agent: If no, request that the patient contact the pharmacy for the refill.) (Agent: If yes, when and what did the pharmacy advise?)pharmacy called directly  Preferred Pharmacy (with phone number or street name):  [facility] [address] [place] - [address] [place] [address] Phone:  [phone]  Fax:  [phone]      Agent: Please be advised that RX refills may take up to 3 business days. We ask that you follow-up with your pharmacy.

## 2021-02-18 MED ORDER — HYDROXYZINE HCL 10 MG PO TABS: ORAL_TABLET | ORAL | 0 refills | Status: DC

## 2021-02-18 MED ORDER — BUSPIRONE HCL 5 MG PO TABS: ORAL_TABLET | ORAL | 0 refills | Status: DC

## 2021-02-25 ENCOUNTER — Other Ambulatory Visit: Payer: Self-pay

## 2021-02-25 MED ORDER — NITROGLYCERIN 0.4 MG SL SUBL: 0.4000 mg | SUBLINGUAL_TABLET | SUBLINGUAL | 1 refills | Status: DC | PRN

## 2021-02-25 NOTE — Telephone Encounter (Signed)
Copied from CRM [id number]. Topic: General - Other >> Feb 25, 2021 10:02 AM Darron Doom wrote: Reason for CRM: [facility] called in to inquire of Rx request that was faxed over on 02/23/21 for nitroGLYCERIN (NITROSTAT) 0.4 MG SL tablet. Please call  Ph# [phone] or prescriber line [phone]

## 2021-03-01 ENCOUNTER — Other Ambulatory Visit: Payer: Self-pay | Admitting: Internal Medicine

## 2021-03-01 DIAGNOSIS — F419 Anxiety disorder, unspecified: Secondary | ICD-10-CM

## 2021-03-02 NOTE — Telephone Encounter (Signed)
Is this patient seen by you still? Seen where you filled a week ago?

## 2021-03-13 ENCOUNTER — Encounter: Payer: Self-pay | Admitting: Internal Medicine

## 2021-03-15 ENCOUNTER — Ambulatory Visit (INDEPENDENT_AMBULATORY_CARE_PROVIDER_SITE_OTHER): Payer: Medicare (Managed Care) | Admitting: Internal Medicine

## 2021-03-15 ENCOUNTER — Other Ambulatory Visit: Payer: Self-pay | Admitting: Internal Medicine

## 2021-03-15 ENCOUNTER — Other Ambulatory Visit: Payer: Self-pay

## 2021-03-15 ENCOUNTER — Encounter: Payer: Self-pay | Admitting: Internal Medicine

## 2021-03-15 DIAGNOSIS — U071 COVID-19: Secondary | ICD-10-CM

## 2021-03-15 MED ORDER — ALBUTEROL SULFATE HFA 108 (90 BASE) MCG/ACT IN AERS: 2.0000 | INHALATION_SPRAY | Freq: Four times a day (QID) | RESPIRATORY_TRACT | 0 refills | Status: DC | PRN

## 2021-03-15 NOTE — Patient Instructions (Signed)
COVID-19: Quarantine and Isolation Quarantine If you were exposed Quarantine and stay away from others when you have been in close contact with someone whohas COVID-19. Isolate If you are sick or test positive Isolate when you are sick or when you have COVID-19, even if you don't have symptoms. When to stay home Calculating quarantine The date of your exposure is considered day 0. Day 1 is the first full day after your last contact with a person who has had COVID-19. Stay home and away from other people for at least 5 days. Learn why CDC updated guidance for the general public. IF YOU were exposed to COVID-19 and are NOT up-to-date IF YOU were exposed to COVID-19 and are NOT on COVID-19 vaccinations Quarantine for at least 5 days Stay home Stay home and quarantine for at least 5 full days. Wear a well-fitted mask if you must be around others in your home. Do not travel. Get tested Even if you don't develop symptoms, get tested at least 5 days after you last had close contact with someone with COVID-19. After quarantine Watch for symptoms Watch for symptoms until 10 days after you last had close contact with someone with COVID-19. Avoid travel It is best to avoid travel until a full 10 days after you last had close contact with someone with COVID-19. If you develop symptoms Isolate immediately and get tested. Continue to stay home until you know the results. Wear a well-fitted mask around others. Take precautions until day 10 Wear a mask Wear a well-fitted mask for 10 full days any time you are around others inside your home or in public. Do not go to places where you are unable to wear a mask. If you must travel during days 6-10, take precautions. Avoid being around people who are at high risk IF YOU were exposed to COVID-19 and are up-to-date IF YOU were exposed to COVID-19 and are on COVID-19 vaccinations No quarantine You do not need to stay home unless you develop  symptoms. Get tested Even if you don't develop symptoms, get tested at least 5 days after you last had close contact with someone with COVID-19. Watch for symptoms Watch for symptoms until 10 days after you last had close contact with someone with COVID-19. If you develop symptoms Isolate immediately and get tested. Continue to stay home until you know the results. Wear a well-fitted mask around others. Take precautions until day 10 Wear a mask Wear a well-fitted mask for 10 full days any time you are around others inside your home or in public. Do not go to places where you are unable to wear a mask. Take precautions if traveling Avoid being around people who are at high risk IF YOU were exposed to COVID-19 and had confirmed COVID-19 within the past 90 days (you tested positive using a viral test) No quarantine You do not need to stay home unless you develop symptoms. Watch for symptoms Watch for symptoms until 10 days after you last had close contact with someone with COVID-19. If you develop symptoms Isolate immediately and get tested. Continue to stay home until you know the results. Wear a well-fitted mask around others. Take precautions until day 10 Wear a mask Wear a well-fitted mask for 10 full days any time you are around others inside your home or in public. Do not go to places where you are unable to wear a mask. Take precautions if traveling Avoid being around people who are at high risk Calculating isolation   Day 0 is your first day of symptoms or a positive viral test. Day 1 is the first full day after your symptoms developed or your test specimen was collected. If you have COVID-19 or have symptoms, isolate for at least 5 days. IF YOU tested positive for COVID-19 or have symptoms, regardless of vaccination status Stay home for at least 5 days Stay home for 5 days and isolate from others in your home. Wear a well-fitted mask if you must be around others in your home. Do not  travel. Ending isolation if you had symptoms End isolation after 5 full days if you are fever-free for 24 hours (without the use of fever-reducing medication) and your symptoms are improving. Ending isolation if you did NOT have symptoms End isolation after at least 5 full days after your positive test. If you were severely ill with COVID-19 or are immunocompromised You should isolate for at least 10 days. Consult your doctor before ending isolation. Take precautions until day 10 Wear a mask Wear a well-fitted mask for 10 full days any time you are around others inside your home or in public. Do not go to places where you are unable to wear a mask. Do not travel Do not travel until a full 10 days after your symptoms started or the date your positive test was taken if you had no symptoms. Avoid being around people who are at high risk Definitions Exposure Contact with someone infected with SARS-CoV-2, the virus that causes COVID-19,in a way that increases the likelihood of getting infected with the virus. Close contact A close contact is someone who was less than 6 feet away from an infected person (laboratory-confirmed or a clinical diagnosis) for a cumulative total of 15 minutes or more over a 24-hour period. For example, three individual 5-minute exposures for a total of 15 minutes. People who are exposed to someone with COVID-19 after they completed at least 5 days of isolation are notconsidered close contacts. Quarantine Quarantine is a strategy used to prevent transmission of COVID-19 by keeping people who have been in close contact with someone with COVID-19 apart from others. Who does not need to quarantine? If you had close contact with someone with COVID-19 and you are in one of the following groups, you do not need to quarantine. You are up to date with your COVID-19 vaccines. You had confirmed COVID-19 within the last 90 days (meaning you tested positive using a viral test). You  should wear a well-fitting mask around others for 10 days from the date of your last close contact with someone with COVID-19 (the date of last close contact is considered day 0). Get tested at least 5 days after you last had close contact with someone with COVID-19. If you test positive or develop COVID-19 symptoms, isolate from other people and follow recommendations in the Isolation section below. If you tested positive for COVID-19 with a viral test within the previous 90 days and subsequently recovered and remain without COVID-19 symptoms, you do not need to quarantine or get tested after close contact. You should wear a well-fitting mask around others for 10 days from the date of your last close contact withsomeone with COVID-19 (the date of last close contact is considered day 0). Who should quarantine? If you come into close contact with someone with COVID-19, you should quarantine if you are not up to date on COVID-19 vaccines. This includes people who are not vaccinated. What to do for quarantine Stay home and away   from other people for at least 5 days (day 0 through day 5) after your last contact with a person who has COVID-19. The date of your exposure is considered day 0. Wear a well-fitting mask when around others at home, if possible. For 10 days after your last close contact with someone with COVID-19, watch for fever (100.4F or greater), cough, shortness of breath, or other COVID-19 symptoms. If you develop symptoms, get tested immediately and isolate until you receive your test results. If you test positive, follow isolation recommendations. If you do not develop symptoms, get tested at least 5 days after you last had close contact with someone with COVID-19. If you test negative, you can leave your home, but continue to wear a well-fitting mask when around others at home and in public until 10 days after your last close contact with someone with COVID-19. If you test positive, you should  isolate for at least 5 days from the date of your positive test (if you do not have symptoms). If you do develop COVID-19 symptoms, isolate for at least 5 days from the date your symptoms began (the date the symptoms started is day 0). Follow recommendations in the isolation section below. If you are unable to get a test 5 days after last close contact with someone with COVID-19, you can leave your home after day 5 if you have been without COVID-19 symptoms throughout the 5-day period. Wear a well-fitting mask for 10 days after your date of last close contact when around others at home and in public. Avoid people who are immunocompromised or at high risk for severe disease, and nursing homes and other high-risk settings, until after at least 10 days. If possible, stay away from people you live with, especially people who are at higher risk for getting very sick from COVID-19, as well as others outside your home throughout the full 10 days after your last close contact with someone with COVID-19. If you are unable to quarantine, you should wear a well-fitting mask for 10 days when around others at home and in public. If you are unable to wear a mask when around others, you should continue to quarantine for 10 days. Avoid people who are immunocompromised or at high risk for severe disease, and nursing homes and other high-risk settings, until after at least 10 days. See additional information about travel. Do not go to places where you are unable to wear a mask, such as restaurants and some gyms, and avoid eating around others at home and at work until after 10 days after your last close contact with someone with COVID-19. After quarantine Watch for symptoms until 10 days after your last close contact with someone with COVID-19. If you have symptoms, isolate immediately and get tested. Quarantine in high-risk congregate settings In certain congregate settings that have high risk of secondary transmission  (such as correctional and detention facilities, homeless shelters, or cruise ships), CDC recommends a 10-day quarantine for residents, regardless of vaccination and booster status. During periods of critical staffing shortages, facilities may consider shortening the quarantine period for staff to ensure continuity of operations. Decisions to shorten quarantine in these settings should be made in consultation with state, local, tribal, or territorial health departments and should take into consideration the context and characteristics of the facility. CDC's setting-specific guidance provides additional recommendations for these settings. Isolation Isolation is used to separate people with confirmed or suspected COVID-19 from those without COVID-19. People who are in isolation should stay   home until it's safe for them to be around others. At home, anyone sick or infected should separate from others, or wear a well-fitting mask when they need to be around others. People in isolation should stay in a specific "sick room" or area and use a separate bathroom if available. Everyone who has presumed or confirmed COVID-19 should stay home and isolate from other people for at least 5 full days (day 0 is the first day of symptoms or the date of the day of the positive viral test for asymptomatic persons). They should wear a mask when around others at home and in public for an additional 5 days. People who are confirmed to have COVID-19 or are showing symptoms of COVID-19 need to isolate regardless of their vaccination status. This includes: People who have a positive viral test for COVID-19, regardless of whether or not they have symptoms. People with symptoms of COVID-19, including people who are awaiting test results or have not been tested. People with symptoms should isolate even if they do not know if they have been in close contact with someone with COVID-19. What to do for isolation Monitor your symptoms. If you  have an emergency warning sign (including trouble breathing), seek emergency medical care immediately. Stay in a separate room from other household members, if possible. Use a separate bathroom, if possible. Take steps to improve ventilation at home, if possible. Avoid contact with other members of the household and pets. Don't share personal household items, like cups, towels, and utensils. Wear a well-fitting mask when you need to be around other people. Learn more about what to do if you are sick and how to notify your contacts. Ending isolation for people who had COVID-19 and had symptoms If you had COVID-19 and had symptoms, isolate for at least 5 days. To calculate your 5-day isolation period, day 0 is your first day of symptoms. Day 1 is the first full day after your symptoms developed. You can leave isolation after 5 full days. You can end isolation after 5 full days if you are fever-free for 24 hours without the use of fever-reducing medication and your other symptoms have improved (Loss of taste and smell may persist for weeks or months after recovery and need not delay the end of isolation). You should continue to wear a well-fitting mask around others at home and in public for 5 additional days (day 6 through day 10) after the end of your 5-day isolation period. If you are unable to wear a mask when around others, you should continue to isolate for a full 10 days. Avoid people who are immunocompromised or at high risk for severe disease, and nursing homes and other high-risk settings, until after at least 10 days. If you continue to have fever or your other symptoms have not improved after 5 days of isolation, you should wait to end your isolation until you are fever-free for 24 hours without the use of fever-reducing medication and your other symptoms have improved. Continue to wear a well-fitting mask. Contact your healthcare provider if you have questions. See additional information about  travel. Do not go to places where you are unable to wear a mask, such as restaurants and some gyms, and avoid eating around others at home and at work until a full 10 days after your first day of symptoms. If an individual has access to a test and wants to test, the best approach is to use an antigen test1 towards the end of   the 5-day isolation period. Collect the test sample only if you are fever-free for 24 hours without the use of fever-reducing medication and your other symptoms have improved (loss of taste and smell may persist for weeks or months after recovery and need not delay the end of isolation). If your test result is positive, you should continue to isolate until day 10. If your test result is negative, you can end isolation, but continue to wear a well-fitting mask around others at home and in public until day 10. Follow additional recommendations for masking and avoiding travel as described above. 1As noted in the labeling for authorized over-the counter antigen tests: Negative results should be treated as presumptive. Negative results do not rule out SARS-CoV-2 infection and should not be used as the sole basis for treatment or patient management decisions, including infection control decisions. To improve results, antigen tests should be used twice over a three-day period with at least 24 hours and no more than 48 hours between tests. Note that these recommendations on ending isolation do not apply to people with moderate or severe COVID-19 or with weakened immune systems (immunocompromised). See section below for recommendations for when toend isolation for these groups. Ending isolation for people who tested positive for COVID-19 but had no symptoms If you test positive for COVID-19 and never develop symptoms, isolate for at least 5 days. Day 0 is the day of your positive viral test (based on the date you were tested) and day 1 is the first full day after the specimen was collected for your  positive test. You can leave isolation after 5 full days. If you continue to have no symptoms, you can end isolation after at least 5 days. You should continue to wear a well-fitting mask around others at home and in public until day 10 (day 6 through day 10). If you are unable to wear a mask when around others, you should continue to isolate for 10 days. Avoid people who are immunocompromised or at high risk for severe disease, and nursing homes and other high-risk settings, until after at least 10 days. If you develop symptoms after testing positive, your 5-day isolation period should start over. Day 0 is your first day of symptoms. Follow the recommendations above for ending isolation for people who had COVID-19 and had symptoms. See additional information about travel. Do not go to places where you are unable to wear a mask, such as restaurants and some gyms, and avoid eating around others at home and at work until 10 days after the day of your positive test. If an individual has access to a test and wants to test, the best approach is to use an antigen test1 towards the end of the 5-day isolation period. If your test result is positive, you should continue to isolate until day 10. If your test result is negative, you can end isolation, but continue to wear a well-fitting mask around others at home and in public until day 10. Follow additionalrecommendations for masking and avoiding travel as described above. 1As noted in the labeling for authorized over-the counter antigen tests: Negative results should be treated as presumptive. Negative results do not rule out SARS-CoV-2 infection and should not be used as the sole basis for treatment or patient management decisions, including infection control decisions. To improve results, antigen tests should be used twice over a three-day period with at least 24 hours and no more than 48 hours between tests. Ending isolation for people who were   severely ill with  COVID-19 or have a weakened immune system (immunocompromised) People who are severely ill with COVID-19 (including those who were hospitalized or required intensive care or ventilation support) and people with compromised immune systems might need to isolate at home longer. They may also require testing with a viral test to determine when they can be around others. CDC recommends an isolation period of at least 10 and up to 20 days for people who were severely ill with COVID-19 and for people with weakened immune systems. Consult with your healthcare provider about when you can resume being aroundother people. People who are immunocompromised should talk to their healthcare provider about the potential for reduced immune responses to COVID-19 vaccines and the need to continue to follow current prevention measures (including wearing a well-fitting mask, staying 6 feet apart from others they don't live with, and avoiding crowds and poorly ventilated indoor spaces) to protect themselves against COVID-19 until advised otherwise by their healthcare provider. Close contacts of immunocompromised people--including household members--should also be encouraged to receive all recommended COVID-19 vaccine doses to help protect these people. Isolation in high-risk congregate settings In certain high-risk congregate settings that have high risk of secondary transmission and where it is not feasible to cohort people (such as correctional and detention facilities, homeless shelters, and cruise ships), CDC recommends a 10-day isolation period for residents. During periods of critical staffing shortages, facilities may consider shortening the isolation period for staff to ensure continuity of operations. Decisions to shorten isolation in these settings should be made in consultation with state, local, tribal, or territorial health departments and should take into consideration the context and characteristics of the facility.  CDC's setting-specific guidance provides additional recommendations for these settings. This CDC guidance is meant to supplement--not replace--any federal, state, local,territorial, or tribal health and safety laws, rules, and regulations. Recommendations for specific settings These recommendations do not apply to healthcare professionals. For guidance specific to these settings, see Healthcare professionals: Interim Guidance for Managing Healthcare Personnel with SARS-CoV-2 Infection or Exposure to SARS-CoV-2 Patients, residents, and visitors to healthcare settings: Interim Infection Prevention and Control Recommendations for Healthcare Personnel During the Coronavirus Disease 2019 (COVID-19) Pandemic Additional setting-specific guidance and recommendations are available. These recommendations on quarantine and isolation do apply to K-12 School settings. Additional guidance is available here: Overview of COVID-19 Quarantine for K-12 Schools Travelers: Travel information and recommendations Congregate facilities and other settings: guidance pages for community, work, and school settings Ongoing COVID-19 exposure FAQs I live with someone with COVID-19, but I cannot be separated from them. How do we manage quarantine in this situation? It is very important for people with COVID-19 to remain apart from other people, if possible, even if they are living together. If separation of the person with COVID-19 from others that they live with is not possible, the other people that they live with will have ongoing exposure, meaning they will be repeatedly exposed until that person is no longer able to spread the virus to other people. In this situation, there are precautions you can take to limit the spread of COVID-19: The person with COVID-19 and everyone they live with should wear a well-fitting mask inside the home. If possible, one person should care for the person with COVID-19 to limit the number of people  who are in close contact with the infected person. Take steps to protect yourself and others to reduce transmission in the home: Quarantine if you are not up to date with your COVID-19   vaccines. Isolate if you are sick or tested positive for COVID-19, even if you don't have symptoms. Learn more about the public health recommendations for testing, mask use and quarantine of close contacts, like yourself, who have ongoing exposure. These recommendations differ depending on your vaccination status. What should I do if I have ongoing exposure to COVID-19 from someone I live with? Recommendations for this situation depend on your vaccination status: If you are not up to date on COVID-19 vaccines and have ongoing exposure to COVID-19, you should: Begin quarantine immediately and continue to quarantine throughout the isolation period of the person with COVID-19. Continue to quarantine for an additional 5 days starting the day after the end of isolation for the person with COVID-19. Get tested at least 5 days after the end of isolation of the infected person that lives with them. If you test negative, you can leave the home but should continue to wear a well-fitting mask when around others at home and in public until 10 days after the end of isolation for the person with COVID-19. Isolate immediately if you develop symptoms of COVID-19 or test positive. If you are up to date with COVID-19 vaccines and have ongoing exposure to COVID-19, you should: Get tested at least 5 days after your first exposure. A person with COVID-19 is considered infectious starting 2 days before they develop symptoms, or 2 days before the date of their positive test if they do not have symptoms. Get tested again at least 5 days after the end of isolation for the person with COVID-19. Wear a well-fitting mask when you are around the person with COVID-19, and do this throughout their isolation period. Wear a well-fitting mask around  others for 10 days after the infected person's isolation period ends. Isolate immediately if you develop symptoms of COVID-19 or test positive. What should I do if multiple people I live with test positive for COVID-19 at different times? Recommendations for this situation depend on your vaccination status: If you are not up to date with your COVID-19 vaccines, you should: Quarantine throughout the isolation period of any infected person that you live with. Continue to quarantine until 5 days after the end of isolation date for the most recently infected person that lives with you. For example, if the last day of isolation of the person most recently infected with COVID-19 was June 30, the new 5-day quarantine period starts on July 1. Get tested at least 5 days after the end of isolation for the most recently infected person that lives with you. Wear a well-fitting mask when you are around any person with COVID-19 while that person is in isolation. Wear a well-fitting mask when you are around other people until 10 days after your last close contact. Isolate immediately if you develop symptoms of COVID-19 or test positive. If you are up to date with COVID-19 your vaccines , you should: Get tested at least 5 days after your first exposure. A person with COVID-19 is considered infectious starting 2 days before they developed symptoms, or 2 days before the date of their positive test if they do not have symptoms. Get tested again at least 5 days after the end of isolation for the most recently infected person that lives with you. Wear a well-fitting mask when you are around any person with COVID-19 while that person is in isolation. Wear a well-fitting mask around others for 10 days after the end of isolation for the most recently infected person that   lives with you. For example, if the last day of isolation for the person most recently infected with COVID-19 was June 30, the new 10-day period to wear a  well-fitting mask indoors in public starts on July 1. Isolate immediately if you develop symptoms of COVID-19 or test positive. I had COVID-19 and completed isolation. Do I have to quarantine or get tested if someone I live with gets COVID-19 shortly after I completed isolation? No. If you recently completed isolation and someone that lives with you tests positive for the virus that causes COVID-19 shortly after the end of your isolation period, you do not have to quarantine or get tested as long as you do not develop new symptoms. Once all of the people that live together have completed isolation or quarantine, refer to the guidance below for new exposures to COVID-19. If you had COVID-19 in the previous 90 days and then came into close contact with someone with COVID-19, you do not have to quarantine or get tested if you do not have symptoms. But you should: Wear a well-fitting mask indoors in public for 10 days after exposure. Monitor for COVID-19 symptoms and isolate immediately if symptoms develop. Consult with a healthcare provider for testing recommendations if new symptoms develop. If more than 90 days have passed since your recovery from infection, follow CDC's recommendations for close contacts. These recommendations will differ depending on your vaccination status. 09/09/2020 Content source: National Center for Immunization and Respiratory Diseases (NCIRD), Division of Viral Diseases This information is not intended to replace advice given to you by your health care provider. Make sure you discuss any questions you have with your healthcare provider. Document Revised: 09/17/2020 Document Reviewed: 09/17/2020 Elsevier Patient Education  2022 Elsevier Inc.  

## 2021-03-15 NOTE — Progress Notes (Signed)
Virtual Visit via Video Note  I connected with Dana Miller on 03/15/21 at  4:00 PM EDT by a video enabled telemedicine application and verified that I am speaking with the correct person using two identifiers.  Location: Patient: Home Provider: Office  Person's participating in this video call: Nicki Reaper, NP and Dana Miller   I discussed the limitations of evaluation and management by telemedicine and the availability of in person appointments. The patient expressed understanding and agreed to proceed.  History of Present Illness:  Pt reports fatigue and chest congestion. This started 2 days ago. She reports a cough that is nonproductive. She is short of breath with exertion. She denies headache, runny nose, nasal congestion, ear pain, sore throat, nausea, vomiting or diarrhea. She denies fever, chills or body aches. She tested positive for Covid on 7/21. She has been taking Nyquil OTC with some relief of symptoms. She did not get her Covid vaccines.   Past Medical History:  Diagnosis Date   Anxiety    Cardiac microvascular disease    Chicken pox    Depression    Thyroid mass    right side    Current Outpatient Medications  Medication Sig Dispense Refill   amLODipine (NORVASC) 10 MG tablet Take by mouth.     aspirin EC 81 MG tablet Take 1 tablet by mouth daily.     atorvastatin (LIPITOR) 20 MG tablet Take 1 tablet by mouth daily.  11   busPIRone (BUSPAR) 5 MG tablet TAKE 1 TABLET(5 MG) BY MOUTH TWICE DAILY 180 tablet 0   carvedilol (COREG) 25 MG tablet Take 0.5 tablets by mouth daily at 8 pm.      EPINEPHrine 0.3 mg/0.3 mL IJ SOAJ injection Inject 0.3 mLs (0.3 mg total) into the muscle as needed. For Anaphylaxis 1 Device 0   fexofenadine (ALLEGRA) 180 MG tablet Take 1 tablet by mouth daily.     hydrOXYzine (ATARAX/VISTARIL) 10 MG tablet Take 1 tablet by mouth twice daily as needed. 60 tablet 0   nitroGLYCERIN (NITROSTAT) 0.4 MG SL tablet Place 1 tablet (0.4 mg total) under  the tongue every 5 (five) minutes as needed for chest pain. 30 tablet 1   pramipexole (MIRAPEX) 0.5 MG tablet TAKE 1 TABLET(0.5 MG) BY MOUTH AT BEDTIME 90 tablet 1   QUEtiapine (SEROQUEL) 50 MG tablet TAKE 1/2 TO 1 TABLET(25 TO 50 MG) BY MOUTH TWICE DAILY 180 tablet 0   QUEtiapine (SEROQUEL) 50 MG tablet Take 0.5-1 tablets (25-50 mg total) by mouth 2 (two) times daily. 180 tablet 2   ranolazine (RANEXA) 500 MG 12 hr tablet Take 2 tablets by mouth daily.  3   Selenium Sulfide 2.25 % SHAM Apply 1 application topically daily as needed. 1 Bottle 3   SUMAtriptan (IMITREX) 25 MG tablet TAKE 1 TABLET( 25 MG TOTAL) BY MOUTH EVERY 2 HOURS AS NEEDED FOR MIGRAINE. MAY REPEAT IN 2 HOURS IF HEADACHE PERSISTS OR RECURS 10 tablet 0   topiramate (TOPAMAX) 100 MG tablet TAKE 1 TABLET(100 MG) BY MOUTH DAILY 30 tablet 5   venlafaxine XR (EFFEXOR-XR) 75 MG 24 hr capsule TAKE 3 CAPSULES(225 MG) BY MOUTH DAILY WITH BREAKFAST 270 capsule 0   No current facility-administered medications for this visit.    Allergies  Allergen Reactions   Fentanyl Swelling   Mirtazapine Swelling   Penicillins Anaphylaxis   Sulfa Antibiotics Hives   Ms Contin  [Morphine] Swelling    Family History  Problem Relation Age of Onset   Stroke  Father    Cancer Father        Kidney    AAA (abdominal aortic aneurysm) Father    Depression Sister    Anxiety disorder Sister    Cancer Paternal Uncle        Lung   Depression Maternal Grandmother    Heart disease Paternal Grandmother    Stroke Paternal Grandmother    Diabetes Paternal Grandmother    Diabetes Paternal Grandfather     Social History   Socioeconomic History   Marital status: Married    Spouse name: Not on file   Number of children: Not on file   Years of education: Not on file   Highest education level: Not on file  Occupational History   Not on file  Tobacco Use   Smoking status: Every Day    Packs/day: 0.00    Types: Cigarettes, E-cigarettes   Smokeless  tobacco: Never  Substance and Sexual Activity   Alcohol use: Yes    Alcohol/week: 0.0 standard drinks    Comment: occasional   Drug use: No   Sexual activity: Yes  Other Topics Concern   Not on file  Social History Narrative   Not on file   Social Determinants of Health   Financial Resource Strain: Not on file  Food Insecurity: Not on file  Transportation Needs: Not on file  Physical Activity: Not on file  Stress: Not on file  Social Connections: Not on file  Intimate Partner Violence: Not on file     Constitutional: Pt reports fatigue. Denies fever, malaise, headache or abrupt weight changes.  HEENT: Denies eye pain, eye redness, ear pain, ringing in the ears, wax buildup, runny nose, nasal congestion, bloody nose, or sore throat. Respiratory: Pt reports chest congestion and shortness of breath. Denies difficulty breathing, shortness of breath,  or sputum production.   Cardiovascular: Denies chest pain, chest tightness, palpitations or swelling in the hands or feet.  Gastrointestinal: Denies abdominal pain, bloating, constipation, diarrhea or blood in the stool.   No other specific complaints in a complete review of systems (except as listed in HPI above).    Observations/Objective:  There were no vitals taken for this visit. Wt Readings from Last 3 Encounters:  07/15/20 198 lb (89.8 kg)  07/29/19 169 lb (76.7 kg)  05/01/19 173 lb (78.5 kg)    General: Appears her stated age, well developed, well nourished in NAD. HEENT:  Nose: ; Throat/Mouth:  Pulmonary/Chest: Normal effort. No respiratory distress.   Neurological: Alert and oriented.   BMET    Component Value Date/Time   NA 140 07/15/2020 1611   K 4.7 07/15/2020 1611   CL 107 07/15/2020 1611   CO2 24 07/15/2020 1611   GLUCOSE 94 07/15/2020 1611   BUN 17 07/15/2020 1611   CREATININE 1.31 (H) 07/15/2020 1611   CALCIUM 9.3 07/15/2020 1611    Lipid Panel     Component Value Date/Time   CHOL 240 (H)  07/15/2020 1611   TRIG 98.0 07/15/2020 1611   HDL 87.00 07/15/2020 1611   CHOLHDL 3 07/15/2020 1611   VLDL 19.6 07/15/2020 1611   LDLCALC 134 (H) 07/15/2020 1611    CBC    Component Value Date/Time   WBC 6.7 07/15/2020 1611   RBC 4.08 07/15/2020 1611   HGB 13.9 07/15/2020 1611   HCT 41.3 07/15/2020 1611   PLT 344.0 07/15/2020 1611   MCV 101.2 (H) 07/15/2020 1611   MCHC 33.7 07/15/2020 1611   RDW 13.6 07/15/2020  1611   LYMPHSABS 1.4 07/20/2015 0926   MONOABS 0.5 07/20/2015 0926   EOSABS 0.3 07/20/2015 0926   BASOSABS 0.0 07/20/2015 0926    Hgb A1C Lab Results  Component Value Date   HGBA1C 5.1 07/15/2020       Assessment and Plan:  Covid 19:  Discussed use of oral Paxlovid, but will hold off given her current medications RX for Albuterol 1-2 puffs Q4-6H prn She will continue Nyquil as needed  Return precautions discussed Follow Up Instructions:    I discussed the assessment and treatment plan with the patient. The patient was provided an opportunity to ask questions and all were answered. The patient agreed with the plan and demonstrated an understanding of the instructions.   The patient was advised to call back or seek an in-person evaluation if the symptoms worsen or if the condition fails to improve as anticipated.   Nicki Reaper, NP

## 2021-04-08 ENCOUNTER — Other Ambulatory Visit: Payer: Self-pay | Admitting: Internal Medicine

## 2021-04-08 NOTE — Telephone Encounter (Signed)
Requested medications are due for refill today yes  Requested medications are on the active medication list yes  Last refill 7/9 (mail order)  Last visit 07/2020  Future visit scheduled no  Notes to clinic Failed protocol due to no valid visit within 6  months, no upcoming visit scheduled.

## 2021-04-11 ENCOUNTER — Encounter: Payer: Self-pay | Admitting: Internal Medicine

## 2021-04-11 ENCOUNTER — Other Ambulatory Visit: Payer: Self-pay | Admitting: Internal Medicine

## 2021-04-11 MED ORDER — BUSPIRONE HCL 5 MG PO TABS: ORAL_TABLET | ORAL | 0 refills | Status: DC

## 2021-04-11 MED ORDER — NITROGLYCERIN 0.4 MG SL SUBL: 0.4000 mg | SUBLINGUAL_TABLET | SUBLINGUAL | 1 refills | Status: DC | PRN

## 2021-04-11 MED ORDER — RANOLAZINE ER 500 MG PO TB12: 1000.0000 mg | ORAL_TABLET | Freq: Every day | ORAL | 0 refills | Status: DC

## 2021-04-11 NOTE — Telephone Encounter (Signed)
Medication Refill - Medication: Renolazine  500 mg and the Nitroglyerine 0.4 mg  Has the patient contacted their pharmacy? Yes.   (Agent: If no, request that the patient contact the pharmacy for the refill.) (Agent: If yes, when and what did the pharmacy advise?)  Preferred Pharmacy (with phone number or street name): [facility]  Agent: Please be advised that RX refills may take up to 3 business days. We ask that you follow-up with your pharmacy.

## 2021-04-11 NOTE — Telephone Encounter (Signed)
Requested Prescriptions  Pending Prescriptions Disp Refills  . nitroGLYCERIN (NITROSTAT) 0.4 MG SL tablet 30 tablet 1    Sig: Place 1 tablet (0.4 mg total) under the tongue every 5 (five) minutes as needed for chest pain.     Cardiovascular:  Nitrates Passed - 04/11/2021  5:04 PM      Passed - Last BP in normal range    BP Readings from Last 1 Encounters:  07/15/20 122/82         Passed - Last Heart Rate in normal range    Pulse Readings from Last 1 Encounters:  07/15/20 88         Passed - Valid encounter within last 12 months    Recent Outpatient Visits          3 weeks ago COVID-19   [facility] [place], [place], NP             . ranolazine (RANEXA) 500 MG 12 hr tablet 180 tablet     Sig: Take 2 tablets (1,000 mg total) by mouth daily.     Not Delegated - Cardiovascular: Ranolazine Failed - 04/11/2021  5:04 PM      Failed - This refill cannot be delegated      Passed - K in normal range and within 360 days    Potassium  Date Value Ref Range Status  07/15/2020 4.7 3.5 - 5.1 mEq/L Final         Passed - Valid encounter within last 12 months    Recent Outpatient Visits          3 weeks ago COVID-19   [facility] [place], [place], [place]

## 2021-04-13 ENCOUNTER — Telehealth: Payer: Self-pay | Admitting: Internal Medicine

## 2021-04-13 NOTE — Telephone Encounter (Signed)
Pharmacy needs to know what is the max number of tabs that need to taken per incident / call or send over a new RX including the max dose   Medication in question is nitroGLYCERIN (NITROSTAT) 0.4 MG SL tablet

## 2021-04-14 ENCOUNTER — Telehealth: Payer: Self-pay | Admitting: *Deleted

## 2021-04-14 ENCOUNTER — Other Ambulatory Visit: Payer: Self-pay

## 2021-04-14 NOTE — Telephone Encounter (Signed)
No more than 3 tablets in 24 hours

## 2021-04-14 NOTE — Telephone Encounter (Signed)
Mackie Pai tech with [facility] order pharmacy requesting clarification of  Nitroglycerin (Nitrostat) 0.4 mg SL tabs: Needs a maximum number of doses/day. One tab under the tongue every five minutes-no more than 3 tablets in 24 hours. as needed for chest pain.Provided clarification to pharmacy. #[phone]  Per 04/13/21 chart note from Pamala Hurry, NP

## 2021-04-18 ENCOUNTER — Other Ambulatory Visit: Payer: Self-pay | Admitting: Internal Medicine

## 2021-04-18 DIAGNOSIS — F419 Anxiety disorder, unspecified: Secondary | ICD-10-CM

## 2021-04-19 NOTE — Telephone Encounter (Signed)
Requested medication (s) are due for refill today: yes   Requested medication (s) are on the active medication list: yes  Last refill:  03/02/21 #60 0 refills  Future visit scheduled: no  Notes to clinic:  no future visit . Do you want to refill Rx?     Requested Prescriptions  Pending Prescriptions Disp Refills   hydrOXYzine (ATARAX/VISTARIL) 10 MG tablet [Pharmacy Med Name: Hydroxyzine Hydrochloride 10mg  Tablet] 60 tablet 0    Sig: Take 1 tablet by mouth twice daily as needed.     Ear, Nose, and Throat:  Antihistamines Passed - 04/18/2021  2:59 PM      Passed - Valid encounter within last 12 months    Recent Outpatient Visits           1 month ago COVID-19   [facility] [place], [place], [place]

## 2021-06-17 ENCOUNTER — Other Ambulatory Visit: Payer: Self-pay | Admitting: Internal Medicine

## 2021-06-17 DIAGNOSIS — R252 Cramp and spasm: Secondary | ICD-10-CM

## 2021-06-17 NOTE — Telephone Encounter (Signed)
Requested Prescriptions  Pending Prescriptions Disp Refills  . venlafaxine XR (EFFEXOR-XR) 75 MG 24 hr capsule [Pharmacy Med Name: Venlafaxine Hydrochloride 75mg  Extended-Release Capsule] 270 capsule 0    Sig: Take 3 capsules by mouth daily with breakfast. **Please schedule physical exam.**     Psychiatry: Antidepressants - SNRI - desvenlafaxine & venlafaxine Failed - 06/17/2021 12:06 PM      Failed - LDL in normal range and within 360 days    LDL Cholesterol  Date Value Ref Range Status  07/15/2020 134 (H) 0 - 99 mg/dL Final         Failed - Total Cholesterol in normal range and within 360 days    Cholesterol  Date Value Ref Range Status  07/15/2020 240 (H) 0 - 200 mg/dL Final    Comment:    ATP III Classification       Desirable:  < 200 mg/dL               Borderline High:  200 - 239 mg/dL          High:  > = 14/09/2019 mg/dL         Passed - Triglycerides in normal range and within 360 days    Triglycerides  Date Value Ref Range Status  07/15/2020 98.0 0.0 - 149.0 mg/dL Final    Comment:    Normal:  <150 mg/dLBorderline High:  150 - 199 mg/dL         Passed - Completed PHQ-2 or PHQ-9 in the last 360 days      Passed - Last BP in normal range    BP Readings from Last 1 Encounters:  07/15/20 122/82         Passed - Valid encounter within last 6 months    Recent Outpatient Visits          3 months ago COVID-19   [facility] [place], [place], NP             . pramipexole (MIRAPEX) 0.5 MG tablet [Pharmacy Med Name: Pramipexole Dihydrochloride 0.5mg  Tablet] 90 tablet 0    Sig: Take 1 tablet (0.5mg ) by mouth at bedtime.     Neurology:  Parkinsonian Agents Passed - 06/17/2021 12:06 PM      Passed - Last BP in normal range    BP Readings from Last 1 Encounters:  07/15/20 122/82         Passed - Valid encounter within last 12 months    Recent Outpatient Visits          3 months ago COVID-19   [facility] [place], [place], [place]

## 2021-06-21 ENCOUNTER — Other Ambulatory Visit: Payer: Self-pay | Admitting: Internal Medicine

## 2021-06-21 NOTE — Telephone Encounter (Signed)
Requested medications are due for refill today.  yes  Requested medications are on the active medications list.  yes  Last refill. 04/11/2021 for both  Future visit scheduled.   no  Notes to clinic.  Ranilazine is not delegated. Patient was seen on 03/15/2021 as a video appointment for COVID. Before that pt was last seen in office 07/15/2020.

## 2021-06-24 ENCOUNTER — Other Ambulatory Visit: Payer: Self-pay

## 2021-07-10 ENCOUNTER — Other Ambulatory Visit: Payer: Self-pay | Admitting: Internal Medicine

## 2021-07-10 ENCOUNTER — Encounter: Payer: Self-pay | Admitting: Internal Medicine

## 2021-07-10 DIAGNOSIS — G43819 Other migraine, intractable, without status migrainosus: Secondary | ICD-10-CM

## 2021-07-11 NOTE — Telephone Encounter (Signed)
Requested medications are due for refill today.  yes  Requested medications are on the active medications list.  yes  Last refill. 10/26/2020 #30 with 5 refills  Future visit scheduled.   no  Notes to clinic.  Medication not delegated.

## 2021-07-13 ENCOUNTER — Other Ambulatory Visit: Payer: Self-pay

## 2021-07-13 MED ORDER — NITROGLYCERIN 0.4 MG SL SUBL: 0.4000 mg | SUBLINGUAL_TABLET | SUBLINGUAL | 1 refills | Status: DC | PRN

## 2021-07-14 ENCOUNTER — Telehealth: Payer: Self-pay | Admitting: Internal Medicine

## 2021-07-14 NOTE — Telephone Encounter (Signed)
Helmut Muster from [facility] pharmacy called stated she needs to get clarification on pt medication, nitroGLYCERIN (NITROSTAT) 0.4 MG SL tablet. She stated that she is needing to verify max daily dose since this is a take as needed medication.   Please advise.

## 2021-07-15 NOTE — Telephone Encounter (Signed)
1.2 mg in a 24 hour period

## 2021-07-22 ENCOUNTER — Other Ambulatory Visit: Payer: Self-pay | Admitting: Internal Medicine

## 2021-07-22 DIAGNOSIS — G43819 Other migraine, intractable, without status migrainosus: Secondary | ICD-10-CM

## 2021-07-22 NOTE — Telephone Encounter (Signed)
Copied from CRM [id number]. Topic: Quick Communication - Rx Refill/Question >> Jul 22, 2021  3:42 PM Dana Miller, Cooper Render A wrote: Medication: topiramate (TOPAMAX) 100 MG tablet [[id number]]   Has the patient contacted their pharmacy? Yes.  The patient's husband has been directed to contact their PCP (Agent: If no, request that the patient contact the pharmacy for the refill. If patient does not wish to contact the pharmacy document the reason why and proceed with request.) (Agent: If yes, when and what did the pharmacy advise?)  Preferred Pharmacy (with phone number or street name): [id number] - [place], [place] - [address] AT [facility] [address]  Phone:  [phone] Fax:  [phone]    Has the patient been seen for an appointment in the last year OR does the patient have an upcoming appointment? Yes.    Agent: Please be advised that RX refills may take up to 3 business days. We ask that you follow-up with your pharmacy.

## 2021-07-22 NOTE — Telephone Encounter (Signed)
Requested medication (s) are due for refill today: yes  Requested medication (s) are on the active medication list: yes  Last refill:  10/26/20 #30/5RF  Future visit scheduled: No  Notes to clinic:  Unable to refill per protocol, medication not assigned to the refill protocol.      Requested Prescriptions  Pending Prescriptions Disp Refills   topiramate (TOPAMAX) 100 MG tablet [Pharmacy Med Name: TOPIRAMATE 100MG  TABLETS] 30 tablet 5    Sig: TAKE 1 TABLET(100 MG) BY MOUTH DAILY     There is no refill protocol information for this order

## 2021-08-12 ENCOUNTER — Encounter: Payer: Self-pay | Admitting: Internal Medicine

## 2021-08-12 DIAGNOSIS — G43819 Other migraine, intractable, without status migrainosus: Secondary | ICD-10-CM

## 2021-08-16 ENCOUNTER — Other Ambulatory Visit: Payer: Self-pay | Admitting: Internal Medicine

## 2021-08-16 DIAGNOSIS — G43819 Other migraine, intractable, without status migrainosus: Secondary | ICD-10-CM

## 2021-08-16 MED ORDER — TOPIRAMATE 100 MG PO TABS: ORAL_TABLET | ORAL | 0 refills | Status: DC

## 2021-08-17 ENCOUNTER — Other Ambulatory Visit: Payer: Self-pay

## 2021-08-17 NOTE — Telephone Encounter (Signed)
Requested medications are due for refill today.  Yes for both  Requested medications are on the active medications list.  Yes for both  Last refill. Ranexa 06/23/2021, Effexor 06/17/2021  Future visit scheduled.   no  Notes to clinic.  Pt was last seen 5 months ago for COVID. Prior to that pt was seen in office on 07/15/2020. Ranexa is not delegated. Effexor failed protocol d/t expired labs.    Requested Prescriptions  Pending Prescriptions Disp Refills   ranolazine (RANEXA) 500 MG 12 hr tablet [Pharmacy Med Name: Ranolazine 500mg  Extended-Release Tablet] 180 tablet 0    Sig: Take 2 tablets by mouth daily.     Not Delegated - Cardiovascular: Ranolazine Failed - 08/16/2021  4:48 AM      Failed - This refill cannot be delegated      Failed - K in normal range and within 360 days    Potassium  Date Value Ref Range Status  07/15/2020 4.7 3.5 - 5.1 mEq/L Final          Passed - Valid encounter within last 12 months    Recent Outpatient Visits           5 months ago [facility] [place], Coralie Ke[place], NP               venlafaxine XR (EFFEXOR-XR) 75 MG 24 hr capsule [Pharmacy Med Name: Venlafaxine Hydrochloride 75mg  Extended-Release Capsule] 270 capsule 0    Sig: Take 3 capsules by mouth daily with breakfast. **Please schedule physical exam.**     Psychiatry: Antidepressants - SNRI - desvenlafaxine & venlafaxine Failed - 08/16/2021  4:48 AM      Failed - LDL in normal range and within 360 days    LDL Cholesterol  Date Value Ref Range Status  07/15/2020 134 (H) 0 - 99 mg/dL Final          Failed - Total Cholesterol in normal range and within 360 days    Cholesterol  Date Value Ref Range Status  07/15/2020 240 (H) 0 - 200 mg/dL Final    Comment:    ATP III Classification       Desirable:  < 200 mg/dL               Borderline High:  200 - 239 mg/dL          High:  > = 240 mg/dL          Failed - Triglycerides in normal range and within 360 days     Triglycerides  Date Value Ref Range Status  07/15/2020 98.0 0.0 - 149.0 mg/dL Final    Comment:    Normal:  <150 mg/dLBorderline High:  150 - 199 mg/dL          Failed - Completed PHQ-2 or PHQ-9 in the last 360 days      Passed - Last BP in normal range    BP Readings from Last 1 Encounters:  07/15/20 122/82          Passed - Valid encounter within last 6 months    Recent Outpatient Visits           5 months ago [facility] [place], Coralie Keens, NP

## 2021-08-26 ENCOUNTER — Other Ambulatory Visit (HOSPITAL_COMMUNITY)
Admission: RE | Admit: 2021-08-26 | Discharge: 2021-08-26 | Disposition: A | Discharge status: Home / Self Care | Payer: BC Managed Care – PPO | Source: Ambulatory Visit | Attending: Internal Medicine | Admitting: Internal Medicine

## 2021-08-26 ENCOUNTER — Ambulatory Visit (INDEPENDENT_AMBULATORY_CARE_PROVIDER_SITE_OTHER): Payer: BC Managed Care – PPO | Admitting: Internal Medicine

## 2021-08-26 ENCOUNTER — Encounter: Payer: Self-pay | Admitting: Internal Medicine

## 2021-08-26 ENCOUNTER — Other Ambulatory Visit: Payer: Self-pay

## 2021-08-26 VITALS — BP 121/75 | HR 85 | Ht 65.75 in | Wt 222.0 lb

## 2021-08-26 DIAGNOSIS — Z1231 Encounter for screening mammogram for malignant neoplasm of breast: Secondary | ICD-10-CM

## 2021-08-26 DIAGNOSIS — Z124 Encounter for screening for malignant neoplasm of cervix: Secondary | ICD-10-CM | POA: Insufficient documentation

## 2021-08-26 DIAGNOSIS — G43819 Other migraine, intractable, without status migrainosus: Secondary | ICD-10-CM

## 2021-08-26 DIAGNOSIS — I2589 Other forms of chronic ischemic heart disease: Secondary | ICD-10-CM

## 2021-08-26 DIAGNOSIS — Z1211 Encounter for screening for malignant neoplasm of colon: Secondary | ICD-10-CM | POA: Diagnosis not present

## 2021-08-26 DIAGNOSIS — G2581 Restless legs syndrome: Secondary | ICD-10-CM

## 2021-08-26 DIAGNOSIS — Z0001 Encounter for general adult medical examination with abnormal findings: Secondary | ICD-10-CM | POA: Diagnosis not present

## 2021-08-26 DIAGNOSIS — R7309 Other abnormal glucose: Secondary | ICD-10-CM | POA: Diagnosis not present

## 2021-08-26 DIAGNOSIS — R87610 Atypical squamous cells of undetermined significance on cytologic smear of cervix (ASC-US): Secondary | ICD-10-CM | POA: Insufficient documentation

## 2021-08-26 DIAGNOSIS — F419 Anxiety disorder, unspecified: Secondary | ICD-10-CM | POA: Diagnosis not present

## 2021-08-26 DIAGNOSIS — F32A Depression, unspecified: Secondary | ICD-10-CM

## 2021-08-26 DIAGNOSIS — Z6836 Body mass index (BMI) 36.0-36.9, adult: Secondary | ICD-10-CM

## 2021-08-26 NOTE — Progress Notes (Signed)
Subjective:    Patient ID: Dana Miller, female    DOB: February 12, 1976, 46 y.o.   MRN: [id number]  HPI  Patient presents the clinic today for annual exam.  She is also due to follow-up chronic conditions.  Anxiety and Depression: Chronic, managed on Venlafaxine, Buspirone, Seroquel and Hydroxyzine.  She is not currently seeing a therapist or psychiatrist.  She denies SI/HI.  Chronic Microvascular Heart Disease: Her BP today is 121/75.  Her last LDL was 134, triglycerides 98, 07/2020.  She is taking Amlodipine, Carvedilol, Aspirin, Atorvastatin and Ranexa as prescribed.  She has been having some chest pain.  She follows with cardiology.  Migraines: These occur a few times per week, triggered by sports stress.  She is taking Topamax as prescribed.  She takes Imitrex as needed for breakthrough.  She does not follow with neurology.  RLS: Managed with Mirapex with occasional breakthrough of symptoms.  Flu: 05/2021 Tetanus: 12/2014 COVID: Never Pap smear: 2016 Mammogram: Never Colon screening: Never Vision screening: annually Dentist: biannually  Diet: She does eat meat.  She consumes fruits and vegetables.  She does eat some fried foods. Exercise: Walking  Review of Systems     Past Medical History:  Diagnosis Date   Anxiety    Cardiac microvascular disease    Chicken pox    Depression    Thyroid mass    right side    Current Outpatient Medications  Medication Sig Dispense Refill   albuterol (VENTOLIN HFA) 108 (90 Base) MCG/ACT inhaler Inhale 2 puffs into the lungs every 6 (six) hours as needed for wheezing or shortness of breath. 8 g 0   amLODipine (NORVASC) 10 MG tablet Take by mouth.     aspirin EC 81 MG tablet Take 1 tablet by mouth daily.     atorvastatin (LIPITOR) 20 MG tablet Take 1 tablet by mouth daily.  11   busPIRone (BUSPAR) 5 MG tablet Take 1 tablet by mouth twice daily. 180 tablet 0   EPINEPHrine 0.3 mg/0.3 mL IJ SOAJ injection Inject 0.3 mLs (0.3 mg total)  into the muscle as needed. For Anaphylaxis 1 Device 0   fexofenadine (ALLEGRA) 180 MG tablet Take 1 tablet by mouth daily.     hydrOXYzine (ATARAX/VISTARIL) 10 MG tablet Take 1 tablet by mouth twice daily as needed. 180 tablet 0   nitroGLYCERIN (NITROSTAT) 0.4 MG SL tablet Place 1 tablet (0.4 mg total) under the tongue every 5 (five) minutes as needed for chest pain. 30 tablet 1   pramipexole (MIRAPEX) 0.5 MG tablet Take 1 tablet (0.52m) by mouth at bedtime. 90 tablet 0   QUEtiapine (SEROQUEL) 50 MG tablet TAKE 1/2 TO 1 TABLET(25 TO 50 MG) BY MOUTH TWICE DAILY 180 tablet 0   QUEtiapine (SEROQUEL) 50 MG tablet Take 0.5-1 tablets (25-50 mg total) by mouth 2 (two) times daily. 180 tablet 2   ranolazine (RANEXA) 500 MG 12 hr tablet Take 2 tablets by mouth daily. 180 tablet 0   Selenium Sulfide 2.25 % SHAM Apply 1 application topically daily as needed. 1 Bottle 3   SUMAtriptan (IMITREX) 25 MG tablet TAKE 1 TABLET( 25 MG TOTAL) BY MOUTH EVERY 2 HOURS AS NEEDED FOR MIGRAINE. MAY REPEAT IN 2 HOURS IF HEADACHE PERSISTS OR RECURS 10 tablet 0   topiramate (TOPAMAX) 100 MG tablet TAKE 1 TABLET(100 MG) BY MOUTH DAILY Strength: 100 mg 90 tablet 0   venlafaxine XR (EFFEXOR-XR) 75 MG 24 hr capsule Take 3 capsules by mouth daily with breakfast. **Please  schedule physical exam.** 270 capsule 0   carvedilol (COREG) 25 MG tablet Take 25 mg by mouth 2 (two) times daily.     No current facility-administered medications for this visit.    Allergies  Allergen Reactions   Fentanyl Swelling   Mirtazapine Swelling   Penicillins Anaphylaxis   Sulfa Antibiotics Hives   Ms Contin  [Morphine] Swelling    Family History  Problem Relation Age of Onset   Stroke Father    Cancer Father        Kidney    AAA (abdominal aortic aneurysm) Father    Depression Sister    Anxiety disorder Sister    Cancer Paternal Uncle        Lung   Depression Maternal Grandmother    Heart disease Paternal Grandmother    Stroke  Paternal Grandmother    Diabetes Paternal Grandmother    Diabetes Paternal Grandfather     Social History   Socioeconomic History   Marital status: Married    Spouse name: Not on file   Number of children: Not on file   Years of education: Not on file   Highest education level: Not on file  Occupational History   Not on file  Tobacco Use   Smoking status: Every Day    Packs/day: 0.00    Types: Cigarettes, E-cigarettes   Smokeless tobacco: Never  Vaping Use   Vaping Use: Some days   Substances: Nicotine, Flavoring  Substance and Sexual Activity   Alcohol use: Yes    Alcohol/week: 0.0 standard drinks    Comment: occasional   Drug use: No   Sexual activity: Yes  Other Topics Concern   Not on file  Social History Narrative   Not on file   Social Determinants of Health   Financial Resource Strain: Not on file  Food Insecurity: Not on file  Transportation Needs: Not on file  Physical Activity: Not on file  Stress: Not on file  Social Connections: Not on file  Intimate Partner Violence: Not on file     Constitutional: Patient reports headaches.  Denies fever, malaise, fatigue, or abrupt weight changes.  HEENT: Denies eye pain, eye redness, ear pain, ringing in the ears, wax buildup, runny nose, nasal congestion, bloody nose, or sore throat. Respiratory: Denies difficulty breathing, shortness of breath, cough or sputum production.   Cardiovascular: Patient reports intermittent chest pain.  Denies chest tightness, palpitations or swelling in the hands or feet.  Gastrointestinal: Denies abdominal pain, bloating, constipation, diarrhea or blood in the stool.  GU: Denies urgency, frequency, pain with urination, burning sensation, blood in urine, odor or discharge. Musculoskeletal: Denies decrease in range of motion, difficulty with gait, muscle pain or joint pain and swelling.  Skin: Denies redness, rashes, lesions or ulcercations.  Neurological: Patient reports restless  legs.  Denies dizziness, difficulty with memory, difficulty with speech or problems with balance and coordination.  Psych: Patient has a history of anxiety and depression.  Denies SI/HI.  No other specific complaints in a complete review of systems (except as listed in HPI above).  Objective:   Physical Exam  BP 121/75    Pulse 85    Ht 5' 5.75" (1.67 m)    Wt 222 lb (100.7 kg)    SpO2 100%    BMI 36.10 kg/m   Wt Readings from Last 3 Encounters:  07/15/20 198 lb (89.8 kg)  07/29/19 169 lb (76.7 kg)  05/01/19 173 lb (78.5 kg)    General: Appears  her stated age, obese, in NAD. Skin: Warm, dry and intact.  HEENT: Head: normal shape and size; Eyes: sclera white and EOMs intact;  Neck:  Neck supple, trachea midline. No masses, lumps or thyromegaly present.  Cardiovascular: Normal rate and rhythm. S1,S2 noted.  No murmur, rubs or gallops noted. No JVD or BLE edema.  Pulmonary/Chest: Normal effort and positive vesicular breath sounds. No respiratory distress. No wheezes, rales or ronchi noted.  Abdomen: Soft and nontender. Normal bowel sounds.  Pelvic: Normal female anatomy.  Cervix without lesion or mass.  No CMT.  Adnexa nonpalpable. Musculoskeletal: Strength 5/5 BUE/BLE.  No difficulty with gait.  Neurological: Alert and oriented. Cranial nerves II-XII grossly intact. Coordination normal.  Psychiatric: Mood and affect normal. Behavior is normal. Judgment and thought content normal.   BMET    Component Value Date/Time   NA 140 07/15/2020 1611   K 4.7 07/15/2020 1611   CL 107 07/15/2020 1611   CO2 24 07/15/2020 1611   GLUCOSE 94 07/15/2020 1611   BUN 17 07/15/2020 1611   CREATININE 1.31 (H) 07/15/2020 1611   CALCIUM 9.3 07/15/2020 1611    Lipid Panel     Component Value Date/Time   CHOL 240 (H) 07/15/2020 1611   TRIG 98.0 07/15/2020 1611   HDL 87.00 07/15/2020 1611   CHOLHDL 3 07/15/2020 1611   VLDL 19.6 07/15/2020 1611   LDLCALC 134 (H) 07/15/2020 1611    CBC     Component Value Date/Time   WBC 6.7 07/15/2020 1611   RBC 4.08 07/15/2020 1611   HGB 13.9 07/15/2020 1611   HCT 41.3 07/15/2020 1611   PLT 344.0 07/15/2020 1611   MCV 101.2 (H) 07/15/2020 1611   MCHC 33.7 07/15/2020 1611   RDW 13.6 07/15/2020 1611   LYMPHSABS 1.4 07/20/2015 0926   MONOABS 0.5 07/20/2015 0926   EOSABS 0.3 07/20/2015 0926   BASOSABS 0.0 07/20/2015 0926    Hgb A1C Lab Results  Component Value Date   HGBA1C 5.1 07/15/2020            Assessment & Plan:   Preventative Health Maintenance:  Flu shot UTD Tetanus UTD Encouraged her to get her COVID-vaccine if cardiology agrees Pap smear today with STD screening Mammogram ordered, she will call GI breast center to schedule Referral to GI for screening colonoscopy Encouraged her to consume a balanced diet and exercise regimen Advised her to see an eye doctor and dentist annually We will check CBC, c-Met, lipid, A1c  RTC in 6 months, follow-up chronic conditions  Webb Silversmith, NP This visit occurred during the SARS-CoV-2 public health emergency.  Safety protocols were in place, including screening questions prior to the visit, additional usage of staff PPE, and extensive cleaning of exam room while observing appropriate contact time as indicated for disinfecting solutions.

## 2021-08-27 ENCOUNTER — Other Ambulatory Visit: Payer: Self-pay | Admitting: Internal Medicine

## 2021-08-27 ENCOUNTER — Encounter: Payer: Self-pay | Admitting: Internal Medicine

## 2021-08-27 DIAGNOSIS — E66811 Obesity, class 1: Secondary | ICD-10-CM | POA: Insufficient documentation

## 2021-08-27 DIAGNOSIS — E66812 Obesity, class 2: Secondary | ICD-10-CM | POA: Insufficient documentation

## 2021-08-27 DIAGNOSIS — G43819 Other migraine, intractable, without status migrainosus: Secondary | ICD-10-CM

## 2021-08-27 LAB — CBC
HCT: 35.7 % (ref 35.0–45.0)
Hemoglobin: 12.1 g/dL (ref 11.7–15.5)
MCH: 34.5 pg — ABNORMAL HIGH (ref 27.0–33.0)
MCHC: 33.9 g/dL (ref 32.0–36.0)
MCV: 101.7 fL — ABNORMAL HIGH (ref 80.0–100.0)
MPV: 9.4 fL (ref 7.5–12.5)
Platelets: 329 10*3/uL (ref 140–400)
RBC: 3.51 10*6/uL — ABNORMAL LOW (ref 3.80–5.10)
RDW: 12.2 % (ref 11.0–15.0)
WBC: 7.9 10*3/uL (ref 3.8–10.8)

## 2021-08-27 LAB — COMPLETE METABOLIC PANEL WITH GFR
AG Ratio: 1.4 (calc) (ref 1.0–2.5)
ALT: 9 U/L (ref 6–29)
AST: 14 U/L (ref 10–35)
Albumin: 3.9 g/dL (ref 3.6–5.1)
Alkaline phosphatase (APISO): 60 U/L (ref 31–125)
BUN/Creatinine Ratio: 15 (calc) (ref 6–22)
BUN: 16 mg/dL (ref 7–25)
CO2: 25 mmol/L (ref 20–32)
Calcium: 8.7 mg/dL (ref 8.6–10.2)
Chloride: 108 mmol/L (ref 98–110)
Creat: 1.09 mg/dL — ABNORMAL HIGH (ref 0.50–0.99)
Globulin: 2.7 g/dL (calc) (ref 1.9–3.7)
Glucose, Bld: 66 mg/dL (ref 65–99)
Potassium: 4.1 mmol/L (ref 3.5–5.3)
Sodium: 142 mmol/L (ref 135–146)
Total Bilirubin: 0.2 mg/dL (ref 0.2–1.2)
Total Protein: 6.6 g/dL (ref 6.1–8.1)
eGFR: 64 mL/min/{1.73_m2} (ref >=60)

## 2021-08-27 LAB — HEMOGLOBIN A1C
Hgb A1c MFr Bld: 5 % of total Hgb (ref <5.7)
Mean Plasma Glucose: 97 mg/dL
eAG (mmol/L): 5.4 mmol/L

## 2021-08-27 LAB — LIPID PANEL
Cholesterol: 176 mg/dL (ref <200)
HDL: 89 mg/dL (ref >=50)
LDL Cholesterol (Calc): 72 mg/dL (calc)
Non-HDL Cholesterol (Calc): 87 mg/dL (calc) (ref <130)
Total CHOL/HDL Ratio: 2 (calc) (ref <5.0)
Triglycerides: 74 mg/dL (ref <150)

## 2021-08-27 NOTE — Assessment & Plan Note (Signed)
Continue Amlodipine, Carvedilol, Aspirin, Atorvastatin and Ranexa She will continue to follow with cardiology

## 2021-08-27 NOTE — Assessment & Plan Note (Signed)
Continue Mirapex.

## 2021-08-27 NOTE — Assessment & Plan Note (Signed)
Encourage diet and exercise for weight loss 

## 2021-08-27 NOTE — Assessment & Plan Note (Signed)
Continue Venlafaxine, Buspirone, Seroquel and Hydroxyzine Support

## 2021-08-27 NOTE — Patient Instructions (Signed)

## 2021-08-27 NOTE — Assessment & Plan Note (Signed)
Encourage stress reduction techniques Continue Topamax and Imitrex as prescribed

## 2021-08-29 ENCOUNTER — Other Ambulatory Visit: Payer: Self-pay | Admitting: Internal Medicine

## 2021-08-29 DIAGNOSIS — G43819 Other migraine, intractable, without status migrainosus: Secondary | ICD-10-CM

## 2021-08-29 MED ORDER — TOPIRAMATE 100 MG PO TABS: ORAL_TABLET | ORAL | 1 refills | Status: DC

## 2021-08-30 NOTE — Telephone Encounter (Signed)
Refill was sent 08/29/2021.  KP

## 2021-09-05 LAB — CYTOLOGY - PAP
Adequacy: ABSENT
Chlamydia: NEGATIVE
Comment: NEGATIVE
Comment: NEGATIVE
Comment: NEGATIVE
Comment: NORMAL
Diagnosis: UNDETERMINED — AB
High risk HPV: NEGATIVE
Neisseria Gonorrhea: NEGATIVE
Trichomonas: NEGATIVE

## 2021-09-09 ENCOUNTER — Ambulatory Visit
Admission: RE | Admit: 2021-09-09 | Discharge: 2021-09-09 | Disposition: A | Discharge status: Home / Self Care | Payer: BC Managed Care – PPO | Source: Ambulatory Visit | Attending: Internal Medicine | Admitting: Internal Medicine

## 2021-09-09 ENCOUNTER — Other Ambulatory Visit: Payer: Self-pay

## 2021-09-09 DIAGNOSIS — Z1231 Encounter for screening mammogram for malignant neoplasm of breast: Secondary | ICD-10-CM | POA: Diagnosis not present

## 2021-09-12 ENCOUNTER — Encounter: Payer: Self-pay | Admitting: Internal Medicine

## 2021-09-12 DIAGNOSIS — E042 Nontoxic multinodular goiter: Secondary | ICD-10-CM

## 2021-09-16 ENCOUNTER — Ambulatory Visit
Admission: RE | Admit: 2021-09-16 | Discharge: 2021-09-16 | Disposition: A | Discharge status: Home / Self Care | Payer: BC Managed Care – PPO | Source: Ambulatory Visit | Attending: Internal Medicine | Admitting: Internal Medicine

## 2021-09-16 DIAGNOSIS — E041 Nontoxic single thyroid nodule: Secondary | ICD-10-CM | POA: Diagnosis not present

## 2021-09-16 DIAGNOSIS — E042 Nontoxic multinodular goiter: Secondary | ICD-10-CM

## 2021-09-18 ENCOUNTER — Other Ambulatory Visit: Payer: Self-pay | Admitting: Internal Medicine

## 2021-09-20 NOTE — Telephone Encounter (Signed)
Requested medications are due for refill today.  yes  Requested medications are on the active medications list.  yes  Last refill. 06/23/2021  Future visit scheduled.   no  Notes to clinic.  Medication not delegated.    Requested Prescriptions  Pending Prescriptions Disp Refills   ranolazine (RANEXA) 500 MG 12 hr tablet [Pharmacy Med Name: Ranolazine 500mg  Extended-Release Tablet] 180 tablet 0    Sig: Take 2 tablets by mouth daily.     Not Delegated - Cardiovascular: Ranolazine Failed - 09/18/2021  3:00 PM      Failed - This refill cannot be delegated      Failed - Cr in normal range and within 360 days    Creat  Date Value Ref Range Status  08/26/2021 1.09 (H) 0.50 - 0.99 mg/dL Final          Passed - K in normal range and within 360 days    Potassium  Date Value Ref Range Status  08/26/2021 4.1 3.5 - 5.3 mmol/L Final          Passed - Last BP in normal range    BP Readings from Last 1 Encounters:  08/26/21 121/75          Passed - Valid encounter within last 12 months    Recent Outpatient Visits           3 weeks ago Encounter for general adult medical examination with abnormal findings   [facility] [place], [place], NP   6 months ago COVID-19   [facility] [place], [place], NP              Signed Prescriptions Disp Refills   venlafaxine XR (EFFEXOR-XR) 75 MG 24 hr capsule 270 capsule 1    Sig: Take 3 capsules by mouth daily with breakfast. **Please schedule physical exam.**     Psychiatry: Antidepressants - SNRI - desvenlafaxine & venlafaxine Failed - 09/18/2021  3:00 PM      Failed - Cr in normal range and within 360 days    Creat  Date Value Ref Range Status  08/26/2021 1.09 (H) 0.50 - 0.99 mg/dL Final          Failed - Lipid Panel in normal range within the last 12 months    Cholesterol  Date Value Ref Range Status  08/26/2021 176 <200 mg/dL Final   LDL Cholesterol (Calc)  Date Value Ref Range Status   08/26/2021 72 mg/dL (calc) Final    Comment:    Reference range: <100 . Desirable range <100 mg/dL for primary prevention;   <70 mg/dL for patients with CHD or diabetic patients  with > or = 2 CHD risk factors. 08/28/2021 LDL-C is now calculated using the Martin-Hopkins  calculation, which is a validated novel method providing  better accuracy than the Friedewald equation in the  estimation of LDL-C.  Marland Kitchen et al. Horald Pollen. Lenox Ahr): 2061-2068  (http://education.[facility].com/faq/FAQ164)    HDL  Date Value Ref Range Status  08/26/2021 89 > OR = 50 mg/dL Final   Triglycerides  Date Value Ref Range Status  08/26/2021 74 <150 mg/dL Final         Passed - Completed PHQ-2 or PHQ-9 in the last 360 days      Passed - Last BP in normal range    BP Readings from Last 1 Encounters:  08/26/21 121/75          Passed - Valid encounter within last 6 months  Recent Outpatient Visits           3 weeks ago Encounter for general adult medical examination with abnormal findings   [facility] [place], Salvadore [place], NP   6 months ago COVID-19   [facility] [place], Salvadore Oxford, [place]

## 2021-09-20 NOTE — Telephone Encounter (Signed)
Requested Prescriptions  Pending Prescriptions Disp Refills   venlafaxine XR (EFFEXOR-XR) 75 MG 24 hr capsule [Pharmacy Med Name: Venlafaxine Hydrochloride 75mg  Extended-Release Capsule] 270 capsule 1    Sig: Take 3 capsules by mouth daily with breakfast. **Please schedule physical exam.**     Psychiatry: Antidepressants - SNRI - desvenlafaxine & venlafaxine Failed - 09/18/2021  3:00 PM      Failed - Cr in normal range and within 360 days    Creat  Date Value Ref Range Status  08/26/2021 1.09 (H) 0.50 - 0.99 mg/dL Final         Failed - Lipid Panel in normal range within the last 12 months    Cholesterol  Date Value Ref Range Status  08/26/2021 176 <200 mg/dL Final   LDL Cholesterol (Calc)  Date Value Ref Range Status  08/26/2021 72 mg/dL (calc) Final    Comment:    Reference range: <100 . Desirable range <100 mg/dL for primary prevention;   <70 mg/dL for patients with CHD or diabetic patients  with > or = 2 CHD risk factors. 08/28/2021 LDL-C is now calculated using the Martin-Hopkins  calculation, which is a validated novel method providing  better accuracy than the Friedewald equation in the  estimation of LDL-C.  Marland Kitchen et al. Horald Pollen. Lenox Ahr): 2061-2068  (http://education.[facility].com/faq/FAQ164)    HDL  Date Value Ref Range Status  08/26/2021 89 > OR = 50 mg/dL Final   Triglycerides  Date Value Ref Range Status  08/26/2021 74 <150 mg/dL Final         Passed - Completed PHQ-2 or PHQ-9 in the last 360 days      Passed - Last BP in normal range    BP Readings from Last 1 Encounters:  08/26/21 121/75         Passed - Valid encounter within last 6 months    Recent Outpatient Visits          3 weeks ago Encounter for general adult medical examination with abnormal findings   [facility] [place], [place], NP   6 months ago COVID-19   [facility] [place], Mullins, NP              ranolazine (RANEXA) 500 MG 12 hr tablet  [Pharmacy Med Name: Ranolazine 500mg  Extended-Release Tablet] 180 tablet 0    Sig: Take 2 tablets by mouth daily.     Not Delegated - Cardiovascular: Ranolazine Failed - 09/18/2021  3:00 PM      Failed - This refill cannot be delegated      Failed - Cr in normal range and within 360 days    Creat  Date Value Ref Range Status  08/26/2021 1.09 (H) 0.50 - 0.99 mg/dL Final         Passed - K in normal range and within 360 days    Potassium  Date Value Ref Range Status  08/26/2021 4.1 3.5 - 5.3 mmol/L Final         Passed - Last BP in normal range    BP Readings from Last 1 Encounters:  08/26/21 121/75         Passed - Valid encounter within last 12 months    Recent Outpatient Visits          3 weeks ago Encounter for general adult medical examination with abnormal findings   [facility] [place], [facility], NP   6 months ago COVID-19   [place]  [facility] [place], Salvadore Oxford, NP

## 2021-09-21 ENCOUNTER — Other Ambulatory Visit: Payer: Self-pay

## 2021-09-22 NOTE — Progress Notes (Signed)
I agree with positive findings of ASCUS

## 2021-09-27 ENCOUNTER — Other Ambulatory Visit: Payer: Self-pay | Admitting: Internal Medicine

## 2021-09-27 ENCOUNTER — Telehealth: Payer: Self-pay | Admitting: Internal Medicine

## 2021-09-27 NOTE — Telephone Encounter (Signed)
Requested medications are due for refill today.  yes  Requested medications are on the active medications list.  yes  Last refill. 11/12/2020 #180 2 refills  Future visit scheduled.   no  Notes to clinic.  Medication not delegated.    Requested Prescriptions  Pending Prescriptions Disp Refills   QUEtiapine (SEROQUEL) 50 MG tablet 180 tablet 2    Sig: Take 0.5-1 tablets (25-50 mg total) by mouth 2 (two) times daily.     Not Delegated - Psychiatry:  Antipsychotics - Second Generation (Atypical) - quetiapine Failed - 09/27/2021  1:24 PM      Failed - This refill cannot be delegated      Failed - TSH in normal range and within 360 days    TSH  Date Value Ref Range Status  07/15/2020 2.34 0.35 - 4.50 uIU/mL Final          Failed - Lipid Panel in normal range within the last 12 months    Cholesterol  Date Value Ref Range Status  08/26/2021 176 <200 mg/dL Final   LDL Cholesterol (Calc)  Date Value Ref Range Status  08/26/2021 72 mg/dL (calc) Final    Comment:    Reference range: <100 . Desirable range <100 mg/dL for primary prevention;   <70 mg/dL for patients with CHD or diabetic patients  with > or = 2 CHD risk factors. Marland Kitchen LDL-C is now calculated using the Martin-Hopkins  calculation, which is a validated novel method providing  better accuracy than the Friedewald equation in the  estimation of LDL-C.  Cresenciano Genre et al. Annamaria Helling. 1638;466(59): 2061-2068  (http://education.[facility].com/faq/FAQ164)    HDL  Date Value Ref Range Status  08/26/2021 89 > OR = 50 mg/dL Final   Triglycerides  Date Value Ref Range Status  08/26/2021 74 <150 mg/dL Final         Failed - CBC within normal limits and completed in the last 12 months    WBC  Date Value Ref Range Status  08/26/2021 7.9 3.8 - 10.8 Thousand/uL Final   RBC  Date Value Ref Range Status  08/26/2021 3.51 (L) 3.80 - 5.10 Million/uL Final   Hemoglobin  Date Value Ref Range Status  08/26/2021 12.1 11.7 -  15.5 g/dL Final   HCT  Date Value Ref Range Status  08/26/2021 35.7 35.0 - 45.0 % Final   MCHC  Date Value Ref Range Status  08/26/2021 33.9 32.0 - 36.0 g/dL Final   [facility]  Date Value Ref Range Status  08/26/2021 34.5 (H) 27.0 - 33.0 pg Final   MCV  Date Value Ref Range Status  08/26/2021 101.7 (H) 80.0 - 100.0 fL Final   No results found for: PLTCOUNTKUC, LABPLAT, POCPLA RDW  Date Value Ref Range Status  08/26/2021 12.2 11.0 - 15.0 % Final         Failed - CMP within normal limits and completed in the last 12 months    Albumin  Date Value Ref Range Status  07/15/2020 4.4 3.5 - 5.2 g/dL Final   Alkaline Phosphatase  Date Value Ref Range Status  07/15/2020 53 39 - 117 U/L Final   Alkaline phosphatase (APISO)  Date Value Ref Range Status  08/26/2021 60 31 - 125 U/L Final   ALT  Date Value Ref Range Status  08/26/2021 9 6 - 29 U/L Final   AST  Date Value Ref Range Status  08/26/2021 14 10 - 35 U/L Final   BUN  Date Value Ref Range Status  08/26/2021 16 7 - 25 mg/dL Final   Calcium  Date Value Ref Range Status  08/26/2021 8.7 8.6 - 10.2 mg/dL Final   CO2  Date Value Ref Range Status  08/26/2021 25 20 - 32 mmol/L Final   Creat  Date Value Ref Range Status  08/26/2021 1.09 (H) 0.50 - 0.99 mg/dL Final   Glucose, Bld  Date Value Ref Range Status  08/26/2021 66 65 - 99 mg/dL Final    Comment:    .            Fasting reference interval .    Potassium  Date Value Ref Range Status  08/26/2021 4.1 3.5 - 5.3 mmol/L Final   Sodium  Date Value Ref Range Status  08/26/2021 142 135 - 146 mmol/L Final   Total Bilirubin  Date Value Ref Range Status  08/26/2021 0.2 0.2 - 1.2 mg/dL Final   Protein, UA  Date Value Ref Range Status  11/23/2017 neg  Final   Total Protein  Date Value Ref Range Status  08/26/2021 6.6 6.1 - 8.1 g/dL Final   GFR  Date Value Ref Range Status  07/15/2020 49.68 (L) >60.00 mL/min Final    Comment:    Calculated using the  CKD-EPI Creatinine Equation (2021)   eGFR  Date Value Ref Range Status  08/26/2021 64 > OR = 60 mL/min/1.33m Final    Comment:    The eGFR is based on the CKD-EPI 2021 equation. To calculate  the new eGFR from a previous Creatinine or Cystatin C result, go to https://www.kidney.org/professionals/ kdoqi/gfr%5Fcalculator          Passed - Completed PHQ-2 or PHQ-9 in the last 360 days      Passed - Last BP in normal range    BP Readings from Last 1 Encounters:  08/26/21 121/75          Passed - Last Heart Rate in normal range    Pulse Readings from Last 1 Encounters:  08/26/21 85          Passed - Valid encounter within last 6 months    Recent Outpatient Visits           1 month ago Encounter for general adult medical examination with abnormal findings   [facility]   [facility] [place]

## 2021-09-27 NOTE — Telephone Encounter (Signed)
Lillia Abed with [employer] called asking for patient to get a refill on Quetiapine 50 mg  [phone]

## 2021-09-27 NOTE — Telephone Encounter (Signed)
Medication Refill - Medication: Quentiapine 50 mg  Has the patient contacted their pharmacy? Yes.   (Agent: If no, request that the patient contact the pharmacy for the refill. If patient does not wish to contact the pharmacy document the reason why and proceed with request.) (Agent: If yes, when and what did the pharmacy advise?)  Preferred Pharmacy (with phone number or street name): [facility] Has the patient been seen for an appointment in the last year OR does the patient have an upcoming appointment? Yes.    Agent: Please be advised that RX refills may take up to 3 business days. We ask that you follow-up with your pharmacy.

## 2021-09-27 NOTE — Telephone Encounter (Signed)
This is a duplicate request in another encounter.

## 2021-09-28 MED ORDER — QUETIAPINE FUMARATE 50 MG PO TABS: 25.0000 mg | ORAL_TABLET | Freq: Two times a day (BID) | ORAL | 1 refills | Status: DC

## 2021-10-05 DIAGNOSIS — M9903 Segmental and somatic dysfunction of lumbar region: Secondary | ICD-10-CM | POA: Diagnosis not present

## 2021-10-05 DIAGNOSIS — M9905 Segmental and somatic dysfunction of pelvic region: Secondary | ICD-10-CM | POA: Diagnosis not present

## 2021-10-05 DIAGNOSIS — M542 Cervicalgia: Secondary | ICD-10-CM | POA: Diagnosis not present

## 2021-10-05 DIAGNOSIS — M545 Low back pain, unspecified: Secondary | ICD-10-CM | POA: Diagnosis not present

## 2021-10-13 DIAGNOSIS — M542 Cervicalgia: Secondary | ICD-10-CM | POA: Diagnosis not present

## 2021-10-13 DIAGNOSIS — M545 Low back pain, unspecified: Secondary | ICD-10-CM | POA: Diagnosis not present

## 2021-10-13 DIAGNOSIS — M9905 Segmental and somatic dysfunction of pelvic region: Secondary | ICD-10-CM | POA: Diagnosis not present

## 2021-10-13 DIAGNOSIS — M9903 Segmental and somatic dysfunction of lumbar region: Secondary | ICD-10-CM | POA: Diagnosis not present

## 2021-10-25 ENCOUNTER — Other Ambulatory Visit: Payer: Self-pay | Admitting: Internal Medicine

## 2021-10-25 DIAGNOSIS — F419 Anxiety disorder, unspecified: Secondary | ICD-10-CM

## 2021-10-25 DIAGNOSIS — R252 Cramp and spasm: Secondary | ICD-10-CM

## 2021-10-25 NOTE — Telephone Encounter (Signed)
Requested Prescriptions  ?Pending Prescriptions Disp Refills  ?? busPIRone (BUSPAR) 5 MG tablet [Pharmacy Med Name: Buspirone Hydrochloride 5mg  Tablet] 180 tablet 0  ?  Sig: Take 1 tablet by mouth twice daily.  ?  ? Psychiatry: Anxiolytics/Hypnotics - Non-controlled Passed - 10/25/2021  2:05 PM  ?  ?  Passed - Valid encounter within last 12 months  ?  Recent Outpatient Visits   ?      ? 2 months ago Encounter for general adult medical examination with abnormal findings  ? [facility] [place], [place], NP  ? 7 months ago COVID-19  ? [facility] [place], [place], NP  ?  ?  ? ?  ?  ?  ? ?

## 2021-10-26 NOTE — Telephone Encounter (Signed)
Requested Prescriptions  ?Pending Prescriptions Disp Refills  ?? pramipexole (MIRAPEX) 0.5 MG tablet [Pharmacy Med Name: Pramipexole Dihydrochloride 0.5mg  Tablet] 90 tablet 0  ?  Sig: Take 1 tablet (0.5mg ) by mouth at bedtime.  ?  ? Neurology:  Parkinsonian Agents Passed - 10/25/2021  4:57 PM  ?  ?  Passed - Last BP in normal range  ?  BP Readings from Last 1 Encounters:  ?08/26/21 121/75  ?   ?  ?  Passed - Last Heart Rate in normal range  ?  Pulse Readings from Last 1 Encounters:  ?08/26/21 85  ?   ?  ?  Passed - Valid encounter within last 12 months  ?  Recent Outpatient Visits   ?      ? 2 months ago Encounter for general adult medical examination with abnormal findings  ? [facility] [place], [place], NP  ? 7 months ago COVID-19  ? [facility] [place], [place], NP  ?  ?  ? ?  ?  ?  ?? hydrOXYzine (ATARAX) 10 MG tablet [Pharmacy Med Name: Hydroxyzine Hydrochloride 10mg  Tablet] 180 tablet 0  ?  Sig: Take 1 tablet by mouth twice daily as needed.  ?  ? Ear, Nose, and Throat:  Antihistamines 2 Failed - 10/25/2021  4:57 PM  ?  ?  Failed - Cr in normal range and within 360 days  ?  Creat  ?Date Value Ref Range Status  ?08/26/2021 1.09 (H) 0.50 - 0.99 mg/dL Final  ?   ?  ?  Passed - Valid encounter within last 12 months  ?  Recent Outpatient Visits   ?      ? 2 months ago Encounter for general adult medical examination with abnormal findings  ? [facility] [place], [place] W, NP  ? 7 months ago COVID-19  ? [facility] [place], [place], NP  ?  ?  ? ?  ?  ?  ? ?

## 2021-11-03 ENCOUNTER — Other Ambulatory Visit: Payer: Self-pay | Admitting: Internal Medicine

## 2021-11-05 NOTE — Telephone Encounter (Signed)
Requested medications are due for refill today.  no ? ?Requested medications are on the active medications list.  yes ? ?Last refill. 09/28/2021 #180 1 refill ? ?Future visit scheduled.   no ? ?Notes to clinic.  Medication refill not delegated. Last refill went to [facility] - this request is from [facility]. ? ? ? ?Requested Prescriptions  ?Pending Prescriptions Disp Refills  ? QUEtiapine (SEROQUEL) 50 MG tablet [Pharmacy Med Name: QUETIAPINE 50MG  TABLETS] 180 tablet 2  ?  Sig: TAKE 1/2 TO 1 TABLET(25 TO 50 MG) BY MOUTH TWICE DAILY  ?  ? Not Delegated - Psychiatry:  Antipsychotics - Second Generation (Atypical) - quetiapine Failed - 11/05/2021 11:52 AM  ?  ?  Failed - This refill cannot be delegated  ?  ?  Failed - TSH in normal range and within 360 days  ?  TSH  ?Date Value Ref Range Status  ?07/15/2020 2.34 0.35 - 4.50 uIU/mL Final  ?  ?  ?  ?  Failed - Lipid Panel in normal range within the last 12 months  ?  Cholesterol  ?Date Value Ref Range Status  ?08/26/2021 176 <200 mg/dL Final  ? ?LDL Cholesterol (Calc)  ?Date Value Ref Range Status  ?08/26/2021 72 mg/dL (calc) Final  ?  Comment:  ?  Reference range: <100 ?Marland Kitchen ?Desirable range <100 mg/dL for primary prevention;   ?<70 mg/dL for patients with CHD or diabetic patients  ?with > or = 2 CHD risk factors. ?. ?LDL-C is now calculated using the Martin-Hopkins  ?calculation, which is a validated novel method providing  ?better accuracy than the Friedewald equation in the  ?estimation of LDL-C.  ?Cresenciano Genre et al. Annamaria Helling. 3354;562(56): 2061-2068  ?(http://education.[facility].com/faq/FAQ164) ?  ? ?HDL  ?Date Value Ref Range Status  ?08/26/2021 89 > OR = 50 mg/dL Final  ? ?Triglycerides  ?Date Value Ref Range Status  ?08/26/2021 74 <150 mg/dL Final  ? ?  ?  ?  Failed - CBC within normal limits and completed in the last 12 months  ?  WBC  ?Date Value Ref Range Status  ?08/26/2021 7.9 3.8 - 10.8 Thousand/uL Final  ? ?RBC  ?Date Value Ref Range Status  ?08/26/2021  3.51 (L) 3.80 - 5.10 Million/uL Final  ? ?Hemoglobin  ?Date Value Ref Range Status  ?08/26/2021 12.1 11.7 - 15.5 g/dL Final  ? ?HCT  ?Date Value Ref Range Status  ?08/26/2021 35.7 35.0 - 45.0 % Final  ? ?MCHC  ?Date Value Ref Range Status  ?08/26/2021 33.9 32.0 - 36.0 g/dL Final  ? ?MCH  ?Date Value Ref Range Status  ?08/26/2021 34.5 (H) 27.0 - 33.0 pg Final  ? ?MCV  ?Date Value Ref Range Status  ?08/26/2021 101.7 (H) 80.0 - 100.0 fL Final  ? ?No results found for: PLTCOUNTKUC, LABPLAT, [place] ?RDW  ?Date Value Ref Range Status  ?08/26/2021 12.2 11.0 - 15.0 % Final  ? ?  ?  ?  Failed - CMP within normal limits and completed in the last 12 months  ?  Albumin  ?Date Value Ref Range Status  ?07/15/2020 4.4 3.5 - 5.2 g/dL Final  ? ?Alkaline Phosphatase  ?Date Value Ref Range Status  ?07/15/2020 53 39 - 117 U/L Final  ? ?Alkaline phosphatase (APISO)  ?Date Value Ref Range Status  ?08/26/2021 60 31 - 125 U/L Final  ? ?ALT  ?Date Value Ref Range Status  ?08/26/2021 9 6 - 29 U/L Final  ? ?AST  ?Date Value Ref Range  Status  ?08/26/2021 14 10 - 35 U/L Final  ? ?BUN  ?Date Value Ref Range Status  ?08/26/2021 16 7 - 25 mg/dL Final  ? ?Calcium  ?Date Value Ref Range Status  ?08/26/2021 8.7 8.6 - 10.2 mg/dL Final  ? ?CO2  ?Date Value Ref Range Status  ?08/26/2021 25 20 - 32 mmol/L Final  ? ?Creat  ?Date Value Ref Range Status  ?08/26/2021 1.09 (H) 0.50 - 0.99 mg/dL Final  ? ?Glucose, Bld  ?Date Value Ref Range Status  ?08/26/2021 66 65 - 99 mg/dL Final  ?  Comment:  ?  . ?           Fasting reference interval ?. ?  ? ?Potassium  ?Date Value Ref Range Status  ?08/26/2021 4.1 3.5 - 5.3 mmol/L Final  ? ?Sodium  ?Date Value Ref Range Status  ?08/26/2021 142 135 - 146 mmol/L Final  ? ?Total Bilirubin  ?Date Value Ref Range Status  ?08/26/2021 0.2 0.2 - 1.2 mg/dL Final  ? ?Protein, UA  ?Date Value Ref Range Status  ?11/23/2017 neg  Final  ? ?Total Protein  ?Date Value Ref Range Status  ?08/26/2021 6.6 6.1 - 8.1 g/dL Final  ? ?GFR   ?Date Value Ref Range Status  ?07/15/2020 49.68 (L) >60.00 mL/min Final  ?  Comment:  ?  Calculated using the CKD-EPI Creatinine Equation (2021)  ? ?eGFR  ?Date Value Ref Range Status  ?08/26/2021 64 > OR = 60 mL/min/1.51m Final  ?  Comment:  ?  The eGFR is based on the CKD-EPI 2021 equation. To calculate  ?the new eGFR from a previous Creatinine or Cystatin C ?result, go to https://www.kidney.org/professionals/ ?kdoqi/gfr%5Fcalculator ?  ? ?  ?  ?  Passed - Completed PHQ-2 or PHQ-9 in the last 360 days  ?  ?  Passed - Last BP in normal range  ?  BP Readings from Last 1 Encounters:  ?08/26/21 121/75  ?  ?  ?  ?  Passed - Last Heart Rate in normal range  ?  Pulse Readings from Last 1 Encounters:  ?08/26/21 85  ?  ?  ?  ?  Passed - Valid encounter within last 6 months  ?  Recent Outpatient Visits   ? ?      ? 2 months ago [facility] for general adult medical examination with abnormal findings  [facility]  ? 7 months [facility]-19  [facility]  ? ?  ?  ? ?  ?  ?  ?  ?

## 2021-11-15 ENCOUNTER — Other Ambulatory Visit: Payer: Self-pay

## 2021-11-15 DIAGNOSIS — G43819 Other migraine, intractable, without status migrainosus: Secondary | ICD-10-CM

## 2021-11-15 MED ORDER — TOPIRAMATE 100 MG PO TABS: ORAL_TABLET | ORAL | 1 refills | Status: DC

## 2021-11-15 MED ORDER — ATORVASTATIN CALCIUM 20 MG PO TABS: 20.0000 mg | ORAL_TABLET | Freq: Every day | ORAL | 1 refills | Status: DC

## 2021-11-17 ENCOUNTER — Ambulatory Visit: Payer: Self-pay

## 2021-11-17 NOTE — Telephone Encounter (Signed)
[facility] called, spoke with [place], CPT. She states that pt has allergy to sulfa drugs and wanted to see if ok for pt to tolerate the Topamax 100mg . Pt has been on this medication for quite some time and advised this isn't a new medication for pt. She states she will make note of that and go ahead and get refill sent out for pt. ? ?Summary: pharmacy calling with 2 issues to prescribed med  ? topiramate (TOPAMAX) 100 MG tablet 90 tablet 1 11/15/2021  ?Sig: TAKE 1 TABLET(100 MG) BY MOUTH DAILY  ?Sent to pharmacy as: topiramate (TOPAMAX) 100 MG tablet  ?E-Prescribing Status: Receipt confirmed by pharmacy (11/15/2021  1:03 PM EDT  ?[facility] - [place], [place] - [address]  ?[address] [place]  ?Phone: [phone] Fax: [phone]  ? ?[phone] has called with 2 issues, ??? 's Regarding has pt pitrated to this level on med, ???and pt has an allergy to sulfa, ok you think to tolerate? Please fu on the [phone] line, any one may.  help   ?  ? ?Reason for Disposition ? Pharmacy calling with prescription question and triager answers question ? ?Answer Assessment - Initial Assessment Questions ?1. NAME of MEDICATION: "What medicine are you calling about?" ?    Topamax 100mg  ?2. QUESTION: "What is your question?" (e.g., double dose of medicine, side effect) ?    Pt has allergy to sulfa drugs. Is it ok for pt to tolerate this medication ?3. PRESCRIBING HCP: "Who prescribed it?" Reason: if prescribed by specialist, call should be referred to that group. ?    [phone], NP ? ?Protocols used: Medication Question Call-A-AH ? ?

## 2021-11-21 ENCOUNTER — Other Ambulatory Visit: Payer: Self-pay

## 2021-11-21 MED ORDER — NITROGLYCERIN 0.4 MG SL SUBL: 0.4000 mg | SUBLINGUAL_TABLET | SUBLINGUAL | 1 refills | Status: DC | PRN

## 2021-11-22 ENCOUNTER — Telehealth: Payer: Self-pay | Admitting: Internal Medicine

## 2021-11-22 NOTE — Telephone Encounter (Signed)
Melissa with [employer] is calling in for assistance. She is asking for the pt's maximum daily dose for Rx nitroGLYCERIN (NITROSTAT) 0.4 MG SL tablet  ? ? ?Please assist further.  ? ?Efraim Kaufmann- [phone]  ?

## 2021-11-22 NOTE — Telephone Encounter (Signed)
1.2 mg ?

## 2021-11-23 NOTE — Telephone Encounter (Signed)
[place] advised.  ? ?Thanks,  ? ?-Mickel Baas  ?

## 2021-12-05 ENCOUNTER — Ambulatory Visit: Payer: BC Managed Care – PPO | Admitting: Internal Medicine

## 2021-12-05 NOTE — Progress Notes (Deleted)
Subjective:    Patient ID: Dana Miller, female    DOB: 10-09-1975, 46 y.o.   MRN: [id number]  HPI  Patient presents the clinic today with complaint of.  Review of Systems     Past Medical History:  Diagnosis Date   Anxiety    Cardiac microvascular disease    Chicken pox    Depression    Thyroid mass    right side    Current Outpatient Medications  Medication Sig Dispense Refill   albuterol (VENTOLIN HFA) 108 (90 Base) MCG/ACT inhaler Inhale 2 puffs into the lungs every 6 (six) hours as needed for wheezing or shortness of breath. 8 g 0   amLODipine (NORVASC) 10 MG tablet Take by mouth.     aspirin EC 81 MG tablet Take 1 tablet by mouth daily.     atorvastatin (LIPITOR) 20 MG tablet Take 1 tablet (20 mg total) by mouth daily. 90 tablet 1   busPIRone (BUSPAR) 5 MG tablet Take 1 tablet by mouth twice daily. 180 tablet 0   carvedilol (COREG) 25 MG tablet Take 25 mg by mouth 2 (two) times daily.     EPINEPHrine 0.3 mg/0.3 mL IJ SOAJ injection Inject 0.3 mLs (0.3 mg total) into the muscle as needed. For Anaphylaxis 1 Device 0   fexofenadine (ALLEGRA) 180 MG tablet Take 1 tablet by mouth daily.     hydrOXYzine (ATARAX) 10 MG tablet Take 1 tablet by mouth twice daily as needed. 180 tablet 0   nitroGLYCERIN (NITROSTAT) 0.4 MG SL tablet Place 1 tablet (0.4 mg total) under the tongue every 5 (five) minutes as needed for chest pain. 30 tablet 1   pramipexole (MIRAPEX) 0.5 MG tablet Take 1 tablet (0.5mg ) by mouth at bedtime. 90 tablet 0   QUEtiapine (SEROQUEL) 50 MG tablet TAKE 1/2 TO 1 TABLET(25 TO 50 MG) BY MOUTH TWICE DAILY 180 tablet 0   QUEtiapine (SEROQUEL) 50 MG tablet Take 0.5-1 tablets (25-50 mg total) by mouth 2 (two) times daily. 180 tablet 1   ranolazine (RANEXA) 500 MG 12 hr tablet Take 2 tablets by mouth daily. 180 tablet 0   Selenium Sulfide 2.25 % SHAM Apply 1 application topically daily as needed. 1 Bottle 3   SUMAtriptan (IMITREX) 25 MG tablet TAKE 1 TABLET( 25 MG  TOTAL) BY MOUTH EVERY 2 HOURS AS NEEDED FOR MIGRAINE. MAY REPEAT IN 2 HOURS IF HEADACHE PERSISTS OR RECURS 10 tablet 0   topiramate (TOPAMAX) 100 MG tablet TAKE 1 TABLET(100 MG) BY MOUTH DAILY 90 tablet 1   venlafaxine XR (EFFEXOR-XR) 75 MG 24 hr capsule Take 3 capsules by mouth daily with breakfast. **Please schedule physical exam.** 270 capsule 1   No current facility-administered medications for this visit.    Allergies  Allergen Reactions   Fentanyl Swelling   Mirtazapine Swelling   Penicillins Anaphylaxis   Sulfa Antibiotics Hives   Ms Contin  [Morphine] Swelling    Family History  Problem Relation Age of Onset   Stroke Father    Cancer Father        Kidney    AAA (abdominal aortic aneurysm) Father    Depression Sister    Anxiety disorder Sister    Cancer Paternal Uncle        Lung   Depression Maternal Grandmother    Heart disease Paternal Grandmother    Stroke Paternal Grandmother    Diabetes Paternal Grandmother    Diabetes Paternal Grandfather     Social History   Socioeconomic  History   Marital status: Married    Spouse name: Not on file   Number of children: Not on file   Years of education: Not on file   Highest education level: Not on file  Occupational History   Not on file  Tobacco Use   Smoking status: Every Day    Packs/day: 0.00    Types: Cigarettes, E-cigarettes   Smokeless tobacco: Never  Vaping Use   Vaping Use: Some days   Substances: Nicotine, Flavoring  Substance and Sexual Activity   Alcohol use: Yes    Alcohol/week: 0.0 standard drinks    Comment: occasional   Drug use: No   Sexual activity: Yes  Other Topics Concern   Not on file  Social History Narrative   Not on file   Social Determinants of Health   Financial Resource Strain: Not on file  Food Insecurity: Not on file  Transportation Needs: Not on file  Physical Activity: Not on file  Stress: Not on file  Social Connections: Not on file  Intimate Partner Violence: Not  on file     Constitutional: Patient reports intermittent headaches.  Denies fever, malaise, fatigue, or abrupt weight changes.  HEENT: Denies eye pain, eye redness, ear pain, ringing in the ears, wax buildup, runny nose, nasal congestion, bloody nose, or sore throat. Respiratory: Denies difficulty breathing, shortness of breath, cough or sputum production.   Cardiovascular: Denies chest pain, chest tightness, palpitations or swelling in the hands or feet.  Gastrointestinal: Denies abdominal pain, bloating, constipation, diarrhea or blood in the stool.  GU: Denies urgency, frequency, pain with urination, burning sensation, blood in urine, odor or discharge. Musculoskeletal: Denies decrease in range of motion, difficulty with gait, muscle pain or joint pain and swelling.  Skin: Denies redness, rashes, lesions or ulcercations.  Neurological: Patient reports restless legs.  Denies dizziness, difficulty with memory, difficulty with speech or problems with balance and coordination.  Psych: Patient has history of anxiety and depression.  Denies SI/HI.  No other specific complaints in a complete review of systems (except as listed in HPI above).  Objective:   Physical Exam  There were no vitals taken for this visit. Wt Readings from Last 3 Encounters:  08/26/21 222 lb (100.7 kg)  07/15/20 198 lb (89.8 kg)  07/29/19 169 lb (76.7 kg)    General: Appears their stated age, well developed, well nourished in NAD. Skin: Warm, dry and intact. No rashes, lesions or ulcerations noted. HEENT: Head: normal shape and size; Eyes: sclera white, no icterus, conjunctiva pink, PERRLA and EOMs intact; Ears: Tm's gray and intact, normal light reflex; Nose: mucosa pink and moist, septum midline; Throat/Mouth: Teeth present, mucosa pink and moist, no exudate, lesions or ulcerations noted.  Neck:  Neck supple, trachea midline. No masses, lumps or thyromegaly present.  Cardiovascular: Normal rate and rhythm. S1,S2  noted.  No murmur, rubs or gallops noted. No JVD or BLE edema. No carotid bruits noted. Pulmonary/Chest: Normal effort and positive vesicular breath sounds. No respiratory distress. No wheezes, rales or ronchi noted.  Abdomen: Soft and nontender. Normal bowel sounds. No distention or masses noted. Liver, spleen and kidneys non palpable. Musculoskeletal: Normal range of motion. No signs of joint swelling. No difficulty with gait.  Neurological: Alert and oriented. Cranial nerves II-XII grossly intact. Coordination normal.  Psychiatric: Mood and affect normal. Behavior is normal. Judgment and thought content normal.    BMET    Component Value Date/Time   NA 142 08/26/2021 1509  K 4.1 08/26/2021 1509   CL 108 08/26/2021 1509   CO2 25 08/26/2021 1509   GLUCOSE 66 08/26/2021 1509   BUN 16 08/26/2021 1509   CREATININE 1.09 (H) 08/26/2021 1509   CALCIUM 8.7 08/26/2021 1509    Lipid Panel     Component Value Date/Time   CHOL 176 08/26/2021 1509   TRIG 74 08/26/2021 1509   HDL 89 08/26/2021 1509   CHOLHDL 2.0 08/26/2021 1509   VLDL 19.6 07/15/2020 1611   LDLCALC 72 08/26/2021 1509    CBC    Component Value Date/Time   WBC 7.9 08/26/2021 1509   RBC 3.51 (L) 08/26/2021 1509   HGB 12.1 08/26/2021 1509   HCT 35.7 08/26/2021 1509   PLT 329 08/26/2021 1509   MCV 101.7 (H) 08/26/2021 1509   MCH 34.5 (H) 08/26/2021 1509   MCHC 33.9 08/26/2021 1509   RDW 12.2 08/26/2021 1509   LYMPHSABS 1.4 07/20/2015 0926   MONOABS 0.5 07/20/2015 0926   EOSABS 0.3 07/20/2015 0926   BASOSABS 0.0 07/20/2015 0926    Hgb A1C Lab Results  Component Value Date   HGBA1C 5.0 08/26/2021            Assessment & Plan:     Nicki Reaper, NP

## 2021-12-08 ENCOUNTER — Ambulatory Visit: Payer: Medicare (Managed Care) | Admitting: Internal Medicine

## 2021-12-08 ENCOUNTER — Encounter: Payer: Self-pay | Admitting: Internal Medicine

## 2021-12-08 VITALS — BP 110/74 | HR 80 | Temp 97.1°F | Wt 224.0 lb

## 2021-12-08 DIAGNOSIS — R569 Unspecified convulsions: Secondary | ICD-10-CM | POA: Diagnosis not present

## 2021-12-08 DIAGNOSIS — Z1211 Encounter for screening for malignant neoplasm of colon: Secondary | ICD-10-CM | POA: Diagnosis not present

## 2021-12-08 NOTE — Progress Notes (Signed)
? ?Subjective:  ? ? Patient ID: Dolores Frame, female    DOB: 13-Nov-1975, 46 y.o.   MRN: [id number] ? ?HPI ? ?Patient presents to clinic today with complaint of possible seizures. She reports she thinks she has had 3 seizures in the last 3 months. She reports she will "space out" come too and then be unable to recognize what happened or who she was with. She reports the person she was with told her that she was shaking. He did not report that she rolled her eyes back, drooling from the mouth. She reports this lasted for about 20 seconds. She has never had seizures before. ? ?Review of Systems ? ?   ?Past Medical History:  ?Diagnosis Date  ? Anxiety   ? Cardiac microvascular disease   ? Chicken pox   ? Depression   ? Thyroid mass   ? right side  ? ? ?Current Outpatient Medications  ?Medication Sig Dispense Refill  ? albuterol (VENTOLIN HFA) 108 (90 Base) MCG/ACT inhaler Inhale 2 puffs into the lungs every 6 (six) hours as needed for wheezing or shortness of breath. 8 g 0  ? amLODipine (NORVASC) 10 MG tablet Take by mouth.    ? aspirin EC 81 MG tablet Take 1 tablet by mouth daily.    ? atorvastatin (LIPITOR) 20 MG tablet Take 1 tablet (20 mg total) by mouth daily. 90 tablet 1  ? busPIRone (BUSPAR) 5 MG tablet Take 1 tablet by mouth twice daily. 180 tablet 0  ? carvedilol (COREG) 25 MG tablet Take 25 mg by mouth 2 (two) times daily.    ? EPINEPHrine 0.3 mg/0.3 mL IJ SOAJ injection Inject 0.3 mLs (0.3 mg total) into the muscle as needed. For Anaphylaxis 1 Device 0  ? fexofenadine (ALLEGRA) 180 MG tablet Take 1 tablet by mouth daily.    ? hydrOXYzine (ATARAX) 10 MG tablet Take 1 tablet by mouth twice daily as needed. 180 tablet 0  ? nitroGLYCERIN (NITROSTAT) 0.4 MG SL tablet Place 1 tablet (0.4 mg total) under the tongue every 5 (five) minutes as needed for chest pain. 30 tablet 1  ? pramipexole (MIRAPEX) 0.5 MG tablet Take 1 tablet (0.5mg ) by mouth at bedtime. 90 tablet 0  ? QUEtiapine (SEROQUEL) 50 MG tablet TAKE 1/2  TO 1 TABLET(25 TO 50 MG) BY MOUTH TWICE DAILY 180 tablet 0  ? QUEtiapine (SEROQUEL) 50 MG tablet Take 0.5-1 tablets (25-50 mg total) by mouth 2 (two) times daily. 180 tablet 1  ? ranolazine (RANEXA) 500 MG 12 hr tablet Take 2 tablets by mouth daily. 180 tablet 0  ? Selenium Sulfide 2.25 % SHAM Apply 1 application topically daily as needed. 1 Bottle 3  ? SUMAtriptan (IMITREX) 25 MG tablet TAKE 1 TABLET( 25 MG TOTAL) BY MOUTH EVERY 2 HOURS AS NEEDED FOR MIGRAINE. MAY REPEAT IN 2 HOURS IF HEADACHE PERSISTS OR RECURS 10 tablet 0  ? topiramate (TOPAMAX) 100 MG tablet TAKE 1 TABLET(100 MG) BY MOUTH DAILY 90 tablet 1  ? venlafaxine XR (EFFEXOR-XR) 75 MG 24 hr capsule Take 3 capsules by mouth daily with breakfast. **Please schedule physical exam.** 270 capsule 1  ? ?No current facility-administered medications for this visit.  ? ? ?Allergies  ?Allergen Reactions  ? Fentanyl Swelling  ? Mirtazapine Swelling  ? Penicillins Anaphylaxis  ? Sulfa Antibiotics Hives  ? Ms Contin  [Morphine] Swelling  ? ? ?Family History  ?Problem Relation Age of Onset  ? Stroke Father   ? Cancer Father   ?  Kidney   ? AAA (abdominal aortic aneurysm) Father   ? Depression Sister   ? Anxiety disorder Sister   ? Cancer Paternal Uncle   ?     Lung  ? Depression Maternal Grandmother   ? Heart disease Paternal Grandmother   ? Stroke Paternal Grandmother   ? Diabetes Paternal Grandmother   ? Diabetes Paternal Grandfather   ? ? ?Social History  ? ?Socioeconomic History  ? Marital status: Married  ?  Spouse name: Not on file  ? Number of children: Not on file  ? Years of education: Not on file  ? Highest education level: Not on file  ?Occupational History  ? Not on file  ?Tobacco Use  ? Smoking status: Every Day  ?  Packs/day: 0.00  ?  Types: Cigarettes, E-cigarettes  ? Smokeless tobacco: Never  ?Vaping Use  ? Vaping Use: Some days  ? Substances: Nicotine, Flavoring  ?Substance and Sexual Activity  ? Alcohol use: Yes  ?  Alcohol/week: 0.0 standard  drinks  ?  Comment: occasional  ? Drug use: No  ? Sexual activity: Yes  ?Other Topics Concern  ? Not on file  ?Social History Narrative  ? Not on file  ? ?Social Determinants of Health  ? ?Financial Resource Strain: Not on file  ?Food Insecurity: Not on file  ?Transportation Needs: Not on file  ?Physical Activity: Not on file  ?Stress: Not on file  ?Social Connections: Not on file  ?Intimate Partner Violence: Not on file  ? ? ? ?Constitutional: Patient reports intermittent headaches.  Denies fever, malaise, fatigue, or abrupt weight changes.  ?Respiratory: Denies difficulty breathing, shortness of breath, cough or sputum production.   ?Cardiovascular: Denies chest pain, chest tightness, palpitations or swelling in the hands or feet.  ?Musculoskeletal: Denies decrease in range of motion, difficulty with gait, muscle pain or joint pain and swelling.  ?Skin: Denies redness, rashes, lesions or ulcercations.  ?Neurological: Patient reports restless legs, possible seizures.  Denies dizziness, difficulty with memory, or problems with balance and coordination.  ?Psych: Patient has a history of anxiety and depression.  Denies SI/HI. ? ?No other specific complaints in a complete review of systems (except as listed in HPI above). ? ?Objective:  ? Physical Exam ?BP 110/74 (BP Location: Left Arm, Patient Position: Sitting, Cuff Size: Large)   Pulse 80   Temp (!) 97.1 ?F (36.2 ?C) (Temporal)   Wt 224 lb (101.6 kg)   SpO2 99%   BMI 36.43 kg/m?  ? ?Wt Readings from Last 3 Encounters:  ?08/26/21 222 lb (100.7 kg)  ?07/15/20 198 lb (89.8 kg)  ?07/29/19 169 lb (76.7 kg)  ? ? ?General: Appears her stated age, obese, in NAD. ?HEENT: Head: normal shape and size; Eyes: sclera white, no icterus, conjunctiva pink, PERRLA and EOMs intact;  ?Neck:  Neck supple, trachea midline. No masses, lumps or thyromegaly present.  ?Cardiovascular: Normal rate and rhythm. S1,S2 noted.  No murmur, rubs or gallops noted.  ?Pulmonary/Chest: Normal  effort and positive vesicular breath sounds. No respiratory distress. No wheezes, rales or ronchi noted.  ?Musculoskeletal: Normal range of motion. No difficulty with gait.  ?Neurological: Alert and oriented. Cranial nerves II-XII grossly intact. Coordination normal.  ? ? ?BMET ?   ?Component Value Date/Time  ? NA 142 08/26/2021 1509  ? K 4.1 08/26/2021 1509  ? CL 108 08/26/2021 1509  ? CO2 25 08/26/2021 1509  ? GLUCOSE 66 08/26/2021 1509  ? BUN 16 08/26/2021 1509  ? CREATININE  1.09 (H) 08/26/2021 1509  ? CALCIUM 8.7 08/26/2021 1509  ? ? ?Lipid Panel  ?   ?Component Value Date/Time  ? CHOL 176 08/26/2021 1509  ? TRIG 74 08/26/2021 1509  ? HDL 89 08/26/2021 1509  ? CHOLHDL 2.0 08/26/2021 1509  ? VLDL 19.6 07/15/2020 1611  ? LDLCALC 72 08/26/2021 1509  ? ? ?CBC ?   ?Component Value Date/Time  ? WBC 7.9 08/26/2021 1509  ? RBC 3.51 (L) 08/26/2021 1509  ? HGB 12.1 08/26/2021 1509  ? HCT 35.7 08/26/2021 1509  ? PLT 329 08/26/2021 1509  ? MCV 101.7 (H) 08/26/2021 1509  ? MCH 34.5 (H) 08/26/2021 1509  ? MCHC 33.9 08/26/2021 1509  ? RDW 12.2 08/26/2021 1509  ? LYMPHSABS 1.4 07/20/2015 0926  ? MONOABS 0.5 07/20/2015 0926  ? EOSABS 0.3 07/20/2015 0926  ? BASOSABS 0.0 07/20/2015 0926  ? ? ?Hgb A1C ?Lab Results  ?Component Value Date  ? HGBA1C 5.0 08/26/2021  ? ? ? ? ? ? ? ?   ?Assessment & Plan:  ? ?Seizure Like Activity: ? ?Will check CBC, CMET, TSH, HSV 1&2 IgG and IgM ?MRI brain for further evaluation ?Referral to neurology for further evaluation and treatment ? ?RTC in 3 months for follow-up of chronic conditions ?Nicki Reaper, NP ? ?

## 2021-12-08 NOTE — Patient Instructions (Signed)
Seizure, Adult A seizure is a sudden burst of abnormal electrical and chemical activity in the brain. Seizures usually last from 30 seconds to 2 minutes.  What are the causes? Common causes of this condition include: Fever or infection. Problems that affect the brain. These may include: A brain or head injury. Bleeding in the brain. A brain tumor. Low levels of blood sugar or salt. Kidney problems or liver problems. Conditions that are passed from parent to child (are inherited). Problems with a substance, such as: Having a reaction to a drug or a medicine. Stopping the use of a substance all of a sudden (withdrawal). A stroke. Disorders that affect how you develop. Sometimes, the cause may not be known.  What increases the risk? Having someone in your family who has epilepsy. In this condition, seizures happen again and again over time. They have no clear cause. Having had a tonic-clonic seizure before. This type of seizure causes you to: Tighten the muscles of the whole body. Lose consciousness. Having had a head injury or strokes before. Having had a lack of oxygen at birth. What are the signs or symptoms? There are many types of seizures. The symptoms vary depending on the type of seizure you have. Symptoms during a seizure Shaking that you cannot control (convulsions) with fast, jerky movements of muscles. Stiffness of the body. Breathing problems. Feeling mixed up (confused). Staring or not responding to sound or touch. Head nodding. Eyes that blink, flutter, or move fast. Drooling, grunting, or making clicking sounds with your mouth Losing control of when you pee or poop. Symptoms before a seizure Feeling afraid, nervous, or worried. Feeling like you may vomit. Feeling like: You are moving when you are not. Things around you are moving when they are not. Feeling like you saw or heard something before (dj vu). Odd tastes or smells. Changes in how you see. You may  see flashing lights or spots. Symptoms after a seizure Feeling confused. Feeling sleepy. Headache. Sore muscles. How is this treated? If your seizure stops on its own, you will not need treatment. If your seizure lasts longer than 5 minutes, you will normally need treatment. Treatment may include: Medicines given through an IV tube. Avoiding things, such as medicines, that are known to cause your seizures. Medicines to prevent seizures. A device to prevent or control seizures. Surgery. A diet low in carbohydrates and high in fat (ketogenic diet). Follow these instructions at home: Medicines Take over-the-counter and prescription medicines only as told by your doctor. Avoid foods or drinks that may keep your medicine from working, such as alcohol. Activity Follow instructions about driving, swimming, or doing things that would be dangerous if you had another seizure. Wait until your doctor says it is safe for you to do these things. If you live in the [place] ask your local department of motor vehicles when you can drive. Get a lot of rest. Teaching others  Teach friends and family what to do when you have a seizure. They should: Help you get down to the ground. Protect your head and body. Loosen any clothing around your neck. Turn you on your side. Know whether or not you need emergency care. Stay with you until you are better. Also, tell them what not to do if you have a seizure. Tell them: They should not hold you down. They should not put anything in your mouth. General instructions Avoid anything that gives you seizures. Keep a seizure diary. Write down: What you remember   about each seizure. What you think caused each seizure. Keep all follow-up visits. Contact a doctor if: You have another seizure or seizures. Call the doctor each time you have a seizure. The pattern of your seizures changes. You keep having seizures with treatment. You have symptoms of being sick or  having an infection. You are not able to take your medicine. Get help right away if: You have any of these problems: A seizure that lasts longer than 5 minutes. Many seizures in a row and you do not feel better between seizures. A seizure that makes it harder to breathe. A seizure and you can no longer speak or use part of your body. You do not wake up right after a seizure. You get hurt during a seizure. You feel confused or have pain right after a seizure. These symptoms may be an emergency. Get help right away. Call your local emergency services (911 in the [place] Do not wait to see if the symptoms will go away. Do not drive yourself to the hospital. Summary A seizure is a sudden burst of abnormal electrical and chemical activity in the brain. Seizures normally last from 30 seconds to 2 minutes. Causes of seizures include illness, injury to the head, low levels of blood sugar or salt, and certain conditions. Most seizures will stop on their own in less than 5 minutes. Seizures that last longer than 5 minutes are a medical emergency and need treatment right away. Many medicines are used to treat seizures. Take over-the-counter and prescription medicines only as told by your doctor. This information is not intended to replace advice given to you by your health care provider. Make sure you discuss any questions you have with your health care provider. Document Revised: 02/06/2020 Document Reviewed: 02/06/2020 Elsevier Patient Education  2023 Elsevier Inc.  

## 2021-12-11 ENCOUNTER — Ambulatory Visit
Admission: RE | Admit: 2021-12-11 | Discharge: 2021-12-11 | Disposition: A | Discharge status: Home / Self Care | Payer: BC Managed Care – PPO | Source: Ambulatory Visit | Attending: Internal Medicine | Admitting: Internal Medicine

## 2021-12-11 DIAGNOSIS — R569 Unspecified convulsions: Secondary | ICD-10-CM

## 2021-12-15 LAB — COMPLETE METABOLIC PANEL WITH GFR
AG Ratio: 1.4 (calc) (ref 1.0–2.5)
ALT: 11 U/L (ref 6–29)
AST: 18 U/L (ref 10–35)
Albumin: 3.9 g/dL (ref 3.6–5.1)
Alkaline phosphatase (APISO): 63 U/L (ref 31–125)
BUN/Creatinine Ratio: 12 (calc) (ref 6–22)
BUN: 16 mg/dL (ref 7–25)
CO2: 26 mmol/L (ref 20–32)
Calcium: 9 mg/dL (ref 8.6–10.2)
Chloride: 109 mmol/L (ref 98–110)
Creat: 1.3 mg/dL — ABNORMAL HIGH (ref 0.50–0.99)
Globulin: 2.7 g/dL (calc) (ref 1.9–3.7)
Glucose, Bld: 70 mg/dL (ref 65–139)
Potassium: 4.1 mmol/L (ref 3.5–5.3)
Sodium: 143 mmol/L (ref 135–146)
Total Bilirubin: 0.3 mg/dL (ref 0.2–1.2)
Total Protein: 6.6 g/dL (ref 6.1–8.1)
eGFR: 52 mL/min/{1.73_m2} — ABNORMAL LOW (ref >=60)

## 2021-12-15 LAB — HSV 1/2 AB (IGM), IFA W/RFLX TITER
HSV 1 IgM Screen: NEGATIVE
HSV 2 IgM Screen: NEGATIVE

## 2021-12-15 LAB — HSV(HERPES SIMPLEX VRS) I + II AB-IGG
HAV 1 IGG,TYPE SPECIFIC AB: 5.11 index — ABNORMAL HIGH
HSV 2 IGG,TYPE SPECIFIC AB: <0.9 index

## 2021-12-15 LAB — CBC
HCT: 39.4 % (ref 35.0–45.0)
Hemoglobin: 12.9 g/dL (ref 11.7–15.5)
MCH: 33.4 pg — ABNORMAL HIGH (ref 27.0–33.0)
MCHC: 32.7 g/dL (ref 32.0–36.0)
MCV: 102.1 fL — ABNORMAL HIGH (ref 80.0–100.0)
MPV: 9.8 fL (ref 7.5–12.5)
Platelets: 350 10*3/uL (ref 140–400)
RBC: 3.86 10*6/uL (ref 3.80–5.10)
RDW: 12.8 % (ref 11.0–15.0)
WBC: 6.5 10*3/uL (ref 3.8–10.8)

## 2021-12-15 LAB — TSH: TSH: 2.27 mIU/L

## 2021-12-16 ENCOUNTER — Encounter: Payer: Self-pay | Admitting: Internal Medicine

## 2021-12-16 DIAGNOSIS — N1831 Chronic kidney disease, stage 3a: Secondary | ICD-10-CM

## 2022-01-29 ENCOUNTER — Other Ambulatory Visit: Payer: Self-pay | Admitting: Internal Medicine

## 2022-01-29 DIAGNOSIS — R252 Cramp and spasm: Secondary | ICD-10-CM

## 2022-01-29 DIAGNOSIS — F419 Anxiety disorder, unspecified: Secondary | ICD-10-CM

## 2022-01-30 NOTE — Telephone Encounter (Signed)
Requested Prescriptions  Pending Prescriptions Disp Refills  . busPIRone (BUSPAR) 5 MG tablet [Pharmacy Med Name: Buspirone Hydrochloride 5mg  Tablet] 180 tablet 0    Sig: Take 1 tablet by mouth twice daily.     Psychiatry: Anxiolytics/Hypnotics - Non-controlled Passed - 01/29/2022  3:35 PM      Passed - Valid encounter within last 12 months    Recent Outpatient Visits          1 month ago Seizure-like activity [facility])   [facility] [place], [place], NP   5 months ago Encounter for general adult medical examination with abnormal findings   [facility]   10 months ago COVID-19   [facility] [place], Mullins W, NP             . pramipexole (MIRAPEX) 0.5 MG tablet [Pharmacy Med Name: Pramipexole Dihydrochloride 0.5mg  Tablet] 90 tablet 0    Sig: Take 1 tablet by mouth at bedtime.     Neurology:  Parkinsonian Agents Passed - 01/29/2022  3:35 PM      Passed - Last BP in normal range    BP Readings from Last 1 Encounters:  12/08/21 110/74         Passed - Last Heart Rate in normal range    Pulse Readings from Last 1 Encounters:  12/08/21 80         Passed - Valid encounter within last 12 months    Recent Outpatient Visits          1 month ago Seizure-like activity [facility])   [facility] [place], [place], NP   5 months ago Encounter for general adult medical examination with abnormal findings   [facility] [place], Mullins, NP   10 months ago COVID-19   [facility] [place], Mullins W, NP             . hydrOXYzine (ATARAX) 10 MG tablet [Pharmacy Med Name: Hydroxyzine Hydrochloride 10mg  Tablet] 180 tablet 0    Sig: Take 1 tablet by mouth twice daily as needed.     Ear, Nose, and Throat:  Antihistamines 2 Failed - 01/29/2022  3:35 PM      Failed - Cr in normal range and within 360 days    Creat  Date Value Ref Range Status  12/08/2021 1.30 (H) 0.50 - 0.99 mg/dL Final          Passed - Valid encounter within last 12 months    Recent Outpatient Visits          1 month ago Seizure-like activity [facility])   [facility] [place], [facility], NP   5 months ago Encounter for general adult medical examination with abnormal findings   [facility] [place], [facility], NP   10 months ago COVID-19   [facility] [place], [facility], Mullins

## 2022-02-08 ENCOUNTER — Telehealth: Payer: Self-pay

## 2022-02-08 NOTE — Telephone Encounter (Signed)
Left a message for patient to call back and schedule their Medicare Annual Wellness Visit (AWV) virtually, telephone or face to face. ?  ?Treniece Holsclaw, CMA ?[phone]  ?

## 2022-02-09 ENCOUNTER — Other Ambulatory Visit: Payer: Self-pay | Admitting: Internal Medicine

## 2022-02-09 NOTE — Telephone Encounter (Signed)
Requested Prescriptions  Pending Prescriptions Disp Refills  . nitroGLYCERIN (NITROSTAT) 0.4 MG SL tablet [Pharmacy Med Name: Nitroglycerin 0.4mg  Sublingual Tablet] 25 tablet 0    Sig: Place 1 tablet under the tongue every 5 minutes as needed for chest pain. Maximum daily dose is 3 tablets.     Cardiovascular:  Nitrates Passed - 02/09/2022  1:15 PM      Passed - Last BP in normal range    BP Readings from Last 1 Encounters:  12/08/21 110/74         Passed - Last Heart Rate in normal range    Pulse Readings from Last 1 Encounters:  12/08/21 80         Passed - Valid encounter within last 12 months    Recent Outpatient Visits          2 months ago Seizure-like activity [facility])   [facility] [place], Salvadore [place], NP   5 months ago Encounter for general adult medical examination with abnormal findings   [facility] [place], Salvadore [place], NP   11 months ago COVID-19   [facility] [place], Salvadore [place], [place]

## 2022-03-02 ENCOUNTER — Telehealth: Payer: Self-pay | Admitting: Internal Medicine

## 2022-03-02 NOTE — Telephone Encounter (Signed)
Left message for patient to call back and schedule the Medicare Annual Wellness Visit (AWV) virtually or by telephone.  Last AWV 07/15/20  Please schedule at anytime with [facility].   Any questions, please call me at [phone] 

## 2022-04-16 ENCOUNTER — Other Ambulatory Visit: Payer: Self-pay | Admitting: Internal Medicine

## 2022-04-16 DIAGNOSIS — F419 Anxiety disorder, unspecified: Secondary | ICD-10-CM

## 2022-04-16 DIAGNOSIS — R252 Cramp and spasm: Secondary | ICD-10-CM

## 2022-04-18 NOTE — Telephone Encounter (Signed)
Requested medication (s) are due for refill today: yes  Requested medication (s) are on the active medication list: yes  Last refill:  09/28/21 #180/1  Future visit scheduled: no  Notes to clinic:  Unable to refill per protocol, cannot delegate.    Requested Prescriptions  Pending Prescriptions Disp Refills   QUEtiapine (SEROQUEL) 50 MG tablet [Pharmacy Med Name: Quetiapine Fumarate 26m Tablet] 180 tablet 0    Sig: Take 1/2 to 1 tablet by mouth twice daily.     Not Delegated - Psychiatry:  Antipsychotics - Second Generation (Atypical) - quetiapine Failed - 04/16/2022  4:39 PM      Failed - This refill cannot be delegated      Failed - Lipid Panel in normal range within the last 12 months    Cholesterol  Date Value Ref Range Status  08/26/2021 176 <200 mg/dL Final   LDL Cholesterol (Calc)  Date Value Ref Range Status  08/26/2021 72 mg/dL (calc) Final    Comment:    Reference range: <100 . Desirable range <100 mg/dL for primary prevention;   <70 mg/dL for patients with CHD or diabetic patients  with > or = 2 CHD risk factors. .Marland KitchenLDL-C is now calculated using the Martin-Hopkins  calculation, which is a validated novel method providing  better accuracy than the Friedewald equation in the  estimation of LDL-C.  MCresenciano Genreet al. JAnnamaria Helling 28588;502(77: 2061-2068  (http://education.[facility].com/faq/FAQ164)    HDL  Date Value Ref Range Status  08/26/2021 89 > OR = 50 mg/dL Final   Triglycerides  Date Value Ref Range Status  08/26/2021 74 <150 mg/dL Final         Failed - CBC within normal limits and completed in the last 12 months    WBC  Date Value Ref Range Status  12/08/2021 6.5 3.8 - 10.8 Thousand/uL Final   RBC  Date Value Ref Range Status  12/08/2021 3.86 3.80 - 5.10 Million/uL Final   Hemoglobin  Date Value Ref Range Status  12/08/2021 12.9 11.7 - 15.5 g/dL Final   HCT  Date Value Ref Range Status  12/08/2021 39.4 35.0 - 45.0 % Final   MCHC  Date  Value Ref Range Status  12/08/2021 32.7 32.0 - 36.0 g/dL Final   [facility] Date Value Ref Range Status  12/08/2021 33.4 (H) 27.0 - 33.0 pg Final   MCV  Date Value Ref Range Status  12/08/2021 102.1 (H) 80.0 - 100.0 fL Final   No results found for: "PLTCOUNTKUC", "LABPLAT", "POCPLA" RDW  Date Value Ref Range Status  12/08/2021 12.8 11.0 - 15.0 % Final         Failed - CMP within normal limits and completed in the last 12 months    Albumin  Date Value Ref Range Status  07/15/2020 4.4 3.5 - 5.2 g/dL Final   Alkaline Phosphatase  Date Value Ref Range Status  07/15/2020 53 39 - 117 U/L Final   Alkaline phosphatase (APISO)  Date Value Ref Range Status  12/08/2021 63 31 - 125 U/L Final   ALT  Date Value Ref Range Status  12/08/2021 11 6 - 29 U/L Final   AST  Date Value Ref Range Status  12/08/2021 18 10 - 35 U/L Final   BUN  Date Value Ref Range Status  12/08/2021 16 7 - 25 mg/dL Final   Calcium  Date Value Ref Range Status  12/08/2021 9.0 8.6 - 10.2 mg/dL Final   CO2  Date Value Ref Range Status  12/08/2021 26 20 - 32 mmol/L Final   Creat  Date Value Ref Range Status  12/08/2021 1.30 (H) 0.50 - 0.99 mg/dL Final   Glucose, Bld  Date Value Ref Range Status  12/08/2021 70 65 - 139 mg/dL Final    Comment:    .        Non-fasting reference interval .    Potassium  Date Value Ref Range Status  12/08/2021 4.1 3.5 - 5.3 mmol/L Final   Sodium  Date Value Ref Range Status  12/08/2021 143 135 - 146 mmol/L Final   Total Bilirubin  Date Value Ref Range Status  12/08/2021 0.3 0.2 - 1.2 mg/dL Final   Protein, UA  Date Value Ref Range Status  11/23/2017 neg  Final   Total Protein  Date Value Ref Range Status  12/08/2021 6.6 6.1 - 8.1 g/dL Final   GFR  Date Value Ref Range Status  07/15/2020 49.68 (L) >60.00 mL/min Final    Comment:    Calculated using the CKD-EPI Creatinine Equation (2021)   eGFR  Date Value Ref Range Status  12/08/2021 52 (L) > OR =  60 mL/min/1.94m Final    Comment:    The eGFR is based on the CKD-EPI 2021 equation. To calculate  the new eGFR from a previous Creatinine or Cystatin C result, go to https://www.kidney.org/professionals/ kdoqi/gfr%5Fcalculator          Passed - TSH in normal range and within 360 days    TSH  Date Value Ref Range Status  12/08/2021 2.27 mIU/L Final    Comment:              Reference Range .           > or = 20 Years  0.40-4.50 .                Pregnancy Ranges           First trimester    0.26-2.66           Second trimester   0.55-2.73           Third trimester    0.43-2.91          Passed - Completed PHQ-2 or PHQ-9 in the last 360 days      Passed - Last BP in normal range    BP Readings from Last 1 Encounters:  12/08/21 110/74         Passed - Last Heart Rate in normal range    Pulse Readings from Last 1 Encounters:  12/08/21 80         Passed - Valid encounter within last 6 months    Recent Outpatient Visits           4 months ago Seizure-like activity ([facility]   7 months ago Encounter for general adult medical examination with abnormal findings   [facility]   [facility]              Signed Prescriptions Disp Refills   atorvastatin (LIPITOR) 20 MG tablet 90 tablet 1    Sig: Take 1 tablet by mouth daily.     Cardiovascular:  Antilipid - Statins Failed - 04/16/2022  4:39 PM      Failed - Lipid Panel in normal range within the last 12 months  Cholesterol  Date Value Ref Range Status  08/26/2021 176 <200 mg/dL Final   LDL Cholesterol (Calc)  Date Value Ref Range Status  08/26/2021 72 mg/dL (calc) Final    Comment:    Reference range: <100 . Desirable range <100 mg/dL for primary prevention;   <70 mg/dL for patients with CHD or diabetic patients  with > or = 2 CHD risk factors. Marland Kitchen LDL-C is now  calculated using the Martin-Hopkins  calculation, which is a validated novel method providing  better accuracy than the Friedewald equation in the  estimation of LDL-C.  Cresenciano Genre et al. Annamaria Helling. 6195;093(26): 2061-2068  (http://education.[facility].com/faq/FAQ164)    HDL  Date Value Ref Range Status  08/26/2021 89 > OR = 50 mg/dL Final   Triglycerides  Date Value Ref Range Status  08/26/2021 74 <150 mg/dL Final         Passed - Patient is not pregnant      Passed - Valid encounter within last 12 months    Recent Outpatient Visits           4 months ago Seizure-like activity [facility])   [facility] [place], Coralie Keens, NP   7 months ago Encounter for general adult medical examination with abnormal findings   [facility] [place], Coralie Keens, NP   1 year ago [facility] [place], Coralie Keens, NP               hydrOXYzine (ATARAX) 10 MG tablet 180 tablet 0    Sig: Take 1 tablet by mouth twice daily as needed.     Ear, Nose, and Throat:  Antihistamines 2 Failed - 04/16/2022  4:39 PM      Failed - Cr in normal range and within 360 days    Creat  Date Value Ref Range Status  12/08/2021 1.30 (H) 0.50 - 0.99 mg/dL Final         Passed - Valid encounter within last 12 months    Recent Outpatient Visits           4 months ago Seizure-like activity [facility])   [facility] [place], Coralie Keens, NP   7 months ago Encounter for general adult medical examination with abnormal findings   [facility] [employer], Coralie Keens, NP   1 year ago [facility] [place], Coralie Keens, NP               pramipexole (MIRAPEX) 0.5 MG tablet 90 tablet 0    Sig: Take 1 tablet by mouth at bedtime.     Neurology:  Parkinsonian Agents Passed - 04/16/2022  4:39 PM      Passed - Last BP in normal range    BP Readings from Last 1 Encounters:  12/08/21 110/74         Passed - Last Heart Rate in normal range     Pulse Readings from Last 1 Encounters:  12/08/21 80         Passed - Valid encounter within last 12 months    Recent Outpatient Visits           4 months ago Seizure-like activity [facility])   [facility] [place], Coralie Keens, NP   7 months ago Encounter for general adult medical examination with abnormal findings   [facility] [place], Coralie Keens, NP   1 year ago [facility]  Jearld [place], NP               venlafaxine XR (EFFEXOR-XR) 75 MG 24 hr capsule 270 capsule 0    Sig: Take 3 capsules by mouth daily with breakfast.     Psychiatry: Antidepressants - SNRI - desvenlafaxine & venlafaxine Failed - 04/16/2022  4:39 PM      Failed - Cr in normal range and within 360 days    Creat  Date Value Ref Range Status  12/08/2021 1.30 (H) 0.50 - 0.99 mg/dL Final         Failed - Lipid Panel in normal range within the last 12 months    Cholesterol  Date Value Ref Range Status  08/26/2021 176 <200 mg/dL Final   LDL Cholesterol (Calc)  Date Value Ref Range Status  08/26/2021 72 mg/dL (calc) Final    Comment:    Reference range: <100 . Desirable range <100 mg/dL for primary prevention;   <70 mg/dL for patients with CHD or diabetic patients  with > or = 2 CHD risk factors. Marland Kitchen LDL-C is now calculated using the Martin-Hopkins  calculation, which is a validated novel method providing  better accuracy than the Friedewald equation in the  estimation of LDL-C.  Cresenciano Genre et al. Annamaria Helling. 8592;763(94): 2061-2068  (http://education.[facility].com/faq/FAQ164)    HDL  Date Value Ref Range Status  08/26/2021 89 > OR = 50 mg/dL Final   Triglycerides  Date Value Ref Range Status  08/26/2021 74 <150 mg/dL Final         Passed - Completed PHQ-2 or PHQ-9 in the last 360 days      Passed - Last BP in normal range    BP Readings from Last 1 Encounters:  12/08/21 110/74         Passed - Valid encounter within last 6 months     Recent Outpatient Visits           4 months ago Seizure-like activity [facility])   [facility] [place], Coralie Keens, NP   7 months ago Encounter for general adult medical examination with abnormal findings   [facility] [place], Coralie Keens, NP   1 year ago [facility] [place], Coralie Keens, NP               busPIRone (BUSPAR) 5 MG tablet 180 tablet 0    Sig: Take 1 tablet by mouth twice daily.     Psychiatry: Anxiolytics/Hypnotics - Non-controlled Passed - 04/16/2022  4:39 PM      Passed - Valid encounter within last 12 months    Recent Outpatient Visits           4 months ago Seizure-like activity [facility])   [facility] [place], Coralie Keens, NP   7 months ago Encounter for general adult medical examination with abnormal findings   [facility] [place], Coralie Keens, NP   1 year ago [facility] [place], Coralie Keens, [place]

## 2022-04-18 NOTE — Telephone Encounter (Signed)
Requested Prescriptions  Pending Prescriptions Disp Refills  . atorvastatin (LIPITOR) 20 MG tablet [Pharmacy Med Name: Atorvastatin Calcium 37m Tablet] 90 tablet 0    Sig: Take 1 tablet by mouth daily.     Cardiovascular:  Antilipid - Statins Failed - 04/16/2022  4:39 PM      Failed - Lipid Panel in normal range within the last 12 months    Cholesterol  Date Value Ref Range Status  08/26/2021 176 <200 mg/dL Final   LDL Cholesterol (Calc)  Date Value Ref Range Status  08/26/2021 72 mg/dL (calc) Final    Comment:    Reference range: <100 . Desirable range <100 mg/dL for primary prevention;   <70 mg/dL for patients with CHD or diabetic patients  with > or = 2 CHD risk factors. .Marland KitchenLDL-C is now calculated using the Martin-Hopkins  calculation, which is a validated novel method providing  better accuracy than the Friedewald equation in the  estimation of LDL-C.  MCresenciano Genreet al. JAnnamaria Helling [id number];341(93: [id number]  (http://education.[facility].com/faq/FAQ164)    HDL  Date Value Ref Range Status  08/26/2021 89 > OR = 50 mg/dL Final   Triglycerides  Date Value Ref Range Status  08/26/2021 74 <150 mg/dL Final         Passed - Patient is not pregnant      Passed - Valid encounter within last 12 months    Recent Outpatient Visits          4 months ago Seizure-like activity ([facility]   [facility] [place] [facility]   7 months ago Encounter for general adult medical examination with abnormal findings   [facility] [facility]ie [facility]   1 year ago [facility]BT[facility] RC[facility]ie Ke[facility] NP             . hydrOXYzine (ATARAX) 10 MG tablet [Pharmacy Med Name: Hydroxyzine Hydrochloride 149mTablet] 180 tablet 0    Sig: Take 1 tablet by mouth twice daily as needed.     Ear, Nose, and Throat:  Antihistamines 2 Failed - 04/16/2022  4:39 PM      Failed - Cr in normal range and within 360 days    Creat  Date Value Ref Range Status   12/08/2021 1.30 (H) 0.50 - 0.99 mg/dL Final         Passed - Valid encounter within last 12 months    Recent Outpatient Visits          4 months ago [facility] ([facility]  [facility]   7 months ago Encounter for general adult medical examination with abnormal findings   [facility] [place]   1 year ago [facility], [place]             . pramipexole (MIRAPEX) 0.5 MG tablet [Pharmacy Med Name: Pramipexole Dihydrochloride 0.35m86mablet] 90 tablet 0    Sig: Take 1 tablet by mouth at bedtime.     Neurology:  Parkinsonian Agents Passed - 04/16/2022  4:39 PM      Passed - Last BP in normal range    BP Readings from Last 1 Encounters:  12/08/21 110/74         Passed - Last Heart Rate in normal range    Pulse Readings from Last 1 Encounters:  12/08/21 80  Passed - Valid encounter within last 12 months    Recent Outpatient Visits          4 months ago Seizure-like activity ([place])   [facility] [place] Ke[facility]   7 months ago Encounter for general adult medical examination with abnormal findings   [facility] [place], Dana Keens, NP   1 year ago [facility] [place], Dana W, NP             . venlafaxine XR (EFFEXOR-XR) 75 MG 24 hr capsule [Pharmacy Med Name: Venlafaxine Hydrochloride 63m Extended-Release Capsule] 270 capsule 0    Sig: Take 3 capsules by mouth daily with breakfast. **Please schedule a physical exam.**     Psychiatry: Antidepressants - SNRI - desvenlafaxine & venlafaxine Failed - 04/16/2022  4:39 PM      Failed - Cr in normal range and within 360 days    Creat  Date Value Ref Range Status  12/08/2021 1.30 (H) 0.50 - 0.99 mg/dL Final         Failed - Lipid Panel in normal range within the last 12 months    Cholesterol  Date Value Ref Range Status  08/26/2021 176 <200 mg/dL Final    LDL Cholesterol (Calc)  Date Value Ref Range Status  08/26/2021 72 mg/dL (calc) Final    Comment:    Reference range: <100 . Desirable range <100 mg/dL for primary prevention;   <70 mg/dL for patients with CHD or diabetic patients  with > or = 2 CHD risk factors. .Marland KitchenLDL-C is now calculated using the Martin-Hopkins  calculation, which is a validated novel method providing  better accuracy than the Friedewald equation in the  estimation of LDL-C.  MCresenciano Genreet al. JAnnamaria Helling [id number];563(87: [id number]  (http://education.[facility].com/faq/FAQ164)    HDL  Date Value Ref Range Status  08/26/2021 89 > OR = 50 mg/dL Final   Triglycerides  Date Value Ref Range Status  08/26/2021 74 <150 mg/dL Final         Passed - Completed PHQ-2 or PHQ-9 in the last 360 days      Passed - Last BP in normal range    BP Readings from Last 1 Encounters:  12/08/21 110/74         Passed - Valid encounter within last 6 months    Recent Outpatient Visits          4 months ago Seizure-like activity ([facility]   [facility] RC[facility]ie Ke[facility]   7 months ago Encounter for general adult medical examination with abnormal findings   [facility] [place]ie Keens NP   1 year ago [facility] CenterB[facility] [place] RCoralie Keens NP             . busPIRone (BUSPAR) 5 MG tablet [Pharmacy Med Name: Buspirone Hydrochloride 566mTablet] 180 tablet 0    Sig: Take 1 tablet by mouth twice daily.     Psychiatry: Anxiolytics/Hypnotics - Non-controlled Passed - 04/16/2022  4:39 PM      Passed - Valid encounter within last 12 months    Recent Outpatient Visits          4 months ago [facility] ([facility]  So[facility] CenteraWest LineReCoralie KeensNP   7 months ago Encounter for general adult medical examination with abnormal findings   So[facility]aCourtlandReCoralie KeensNP   1  year ago [facility], Dana W, NP              . QUEtiapine (SEROQUEL) 50 MG tablet [Pharmacy Med Name: Quetiapine Fumarate 70m Tablet] 180 tablet 0    Sig: Take 1/2 to 1 tablet by mouth twice daily.     Not Delegated - Psychiatry:  Antipsychotics - Second Generation (Atypical) - quetiapine Failed - 04/16/2022  4:39 PM      Failed - This refill cannot be delegated      Failed - Lipid Panel in normal range within the last 12 months    Cholesterol  Date Value Ref Range Status  08/26/2021 176 <200 mg/dL Final   LDL Cholesterol (Calc)  Date Value Ref Range Status  08/26/2021 72 mg/dL (calc) Final    Comment:    Reference range: <100 . Desirable range <100 mg/dL for primary prevention;   <70 mg/dL for patients with CHD or diabetic patients  with > or = 2 CHD risk factors. .Marland KitchenLDL-C is now calculated using the Martin-Hopkins  calculation, which is a validated novel method providing  better accuracy than the Friedewald equation in the  estimation of LDL-C.  MCresenciano Genreet al. JAnnamaria Helling [id number];591(63: [id number]  (http://education.[facility].com/faq/FAQ164)    HDL  Date Value Ref Range Status  08/26/2021 89 > OR = 50 mg/dL Final   Triglycerides  Date Value Ref Range Status  08/26/2021 74 <150 mg/dL Final         Failed - CBC within normal limits and completed in the last 12 months    WBC  Date Value Ref Range Status  12/08/2021 6.5 3.8 - 10.8 Thousand/uL Final   RBC  Date Value Ref Range Status  12/08/2021 3.86 3.80 - 5.10 Million/uL Final   Hemoglobin  Date Value Ref Range Status  12/08/2021 12.9 11.7 - 15.5 g/dL Final   HCT  Date Value Ref Range Status  12/08/2021 39.4 35.0 - 45.0 % Final   MCHC  Date Value Ref Range Status  12/08/2021 32.7 32.0 - 36.0 g/dL Final   [facility] Date Value Ref Range Status  12/08/2021 33.4 (H) 27.0 - 33.0 pg Final   MCV  Date Value Ref Range Status  12/08/2021 102.1 (H) 80.0 - 100.0 fL Final   No results found for: "PLTCOUNTKUC", "LABPLAT", "POCPLA" RDW  Date Value  Ref Range Status  12/08/2021 12.8 11.0 - 15.0 % Final         Failed - CMP within normal limits and completed in the last 12 months    Albumin  Date Value Ref Range Status  07/15/2020 4.4 3.5 - 5.2 g/dL Final   Alkaline Phosphatase  Date Value Ref Range Status  07/15/2020 53 39 - 117 U/L Final   Alkaline phosphatase (APISO)  Date Value Ref Range Status  12/08/2021 63 31 - 125 U/L Final   ALT  Date Value Ref Range Status  12/08/2021 11 6 - 29 U/L Final   AST  Date Value Ref Range Status  12/08/2021 18 10 - 35 U/L Final   BUN  Date Value Ref Range Status  12/08/2021 16 7 - 25 mg/dL Final   Calcium  Date Value Ref Range Status  12/08/2021 9.0 8.6 - 10.2 mg/dL Final   CO2  Date Value Ref Range Status  12/08/2021 26 20 - 32 mmol/L Final   Creat  Date Value Ref Range Status  12/08/2021 1.30 (H) 0.50 - 0.99 mg/dL Final  Glucose, Bld  Date Value Ref Range Status  12/08/2021 70 65 - 139 mg/dL Final    Comment:    .        Non-fasting reference interval .    Potassium  Date Value Ref Range Status  12/08/2021 4.1 3.5 - 5.3 mmol/L Final   Sodium  Date Value Ref Range Status  12/08/2021 143 135 - 146 mmol/L Final   Total Bilirubin  Date Value Ref Range Status  12/08/2021 0.3 0.2 - 1.2 mg/dL Final   Protein, UA  Date Value Ref Range Status  11/23/2017 neg  Final   Total Protein  Date Value Ref Range Status  12/08/2021 6.6 6.1 - 8.1 g/dL Final   GFR  Date Value Ref Range Status  07/15/2020 49.68 (L) >60.00 mL/min Final    Comment:    Calculated using the CKD-EPI Creatinine Equation (2021)   eGFR  Date Value Ref Range Status  12/08/2021 52 (L) > OR = 60 mL/min/1.64m Final    Comment:    The eGFR is based on the CKD-EPI 2021 equation. To calculate  the new eGFR from a previous Creatinine or Cystatin C result, go to https://www.kidney.org/professionals/ kdoqi/gfr%5Fcalculator          Passed - TSH in normal range and within 360 days    TSH   Date Value Ref Range Status  12/08/2021 2.27 mIU/L Final    Comment:              Reference Range .           > or = 20 Years  0.40-4.50 .                Pregnancy Ranges           First trimester    0.26-2.66           Second trimester   0.55-2.73           Third trimester    0.43-2.91          Passed - Completed PHQ-2 or PHQ-9 in the last 360 days      Passed - Last BP in normal range    BP Readings from Last 1 Encounters:  12/08/21 110/74         Passed - Last Heart Rate in normal range    Pulse Readings from Last 1 Encounters:  12/08/21 80         Passed - Valid encounter within last 6 months    Recent Outpatient Visits          4 months ago Seizure-like activity ([facility]   [facility] [place] RCoralie Keens NP   7 months ago Encounter for general adult medical examination with abnormal findings   [facility] [place] [facility]   1 year ago [facility] CenterBB[place] RCoralie [place]

## 2022-04-23 ENCOUNTER — Other Ambulatory Visit: Payer: Self-pay | Admitting: Internal Medicine

## 2022-04-25 NOTE — Telephone Encounter (Signed)
Requested medications are due for refill today.  yes  Requested medications are on the active medications list.  yes  Last refill. 09/28/2021 #180 1rf  Future visit scheduled.   no  Notes to clinic.  Refill not delegated.    Requested Prescriptions  Pending Prescriptions Disp Refills   QUEtiapine (SEROQUEL) 50 MG tablet [Pharmacy Med Name: Quetiapine Fumarate 59m Tablet] 180 tablet 0    Sig: Take 1/2 to 1 tablet by mouth twice daily.     Not Delegated - Psychiatry:  Antipsychotics - Second Generation (Atypical) - quetiapine Failed - 04/23/2022  2:58 AM      Failed - This refill cannot be delegated      Failed - Lipid Panel in normal range within the last 12 months    Cholesterol  Date Value Ref Range Status  08/26/2021 176 <200 mg/dL Final   LDL Cholesterol (Calc)  Date Value Ref Range Status  08/26/2021 72 mg/dL (calc) Final    Comment:    Reference range: <100 . Desirable range <100 mg/dL for primary prevention;   <70 mg/dL for patients with CHD or diabetic patients  with > or = 2 CHD risk factors. .Marland KitchenLDL-C is now calculated using the Martin-Hopkins  calculation, which is a validated novel method providing  better accuracy than the Friedewald equation in the  estimation of LDL-C.  MCresenciano Genreet al. JAnnamaria Helling [id number];128(78: 2061-2068  (http://education.[facility].com/faq/FAQ164)    HDL  Date Value Ref Range Status  08/26/2021 89 > OR = 50 mg/dL Final   Triglycerides  Date Value Ref Range Status  08/26/2021 74 <150 mg/dL Final         Failed - CBC within normal limits and completed in the last 12 months    WBC  Date Value Ref Range Status  12/08/2021 6.5 3.8 - 10.8 Thousand/uL Final   RBC  Date Value Ref Range Status  12/08/2021 3.86 3.80 - 5.10 Million/uL Final   Hemoglobin  Date Value Ref Range Status  12/08/2021 12.9 11.7 - 15.5 g/dL Final   HCT  Date Value Ref Range Status  12/08/2021 39.4 35.0 - 45.0 % Final   MCHC  Date Value Ref Range  Status  12/08/2021 32.7 32.0 - 36.0 g/dL Final   [facility] Date Value Ref Range Status  12/08/2021 33.4 (H) 27.0 - 33.0 pg Final   MCV  Date Value Ref Range Status  12/08/2021 102.1 (H) 80.0 - 100.0 fL Final   No results found for: "PLTCOUNTKUC", "LABPLAT", "POCPLA" RDW  Date Value Ref Range Status  12/08/2021 12.8 11.0 - 15.0 % Final         Failed - CMP within normal limits and completed in the last 12 months    Albumin  Date Value Ref Range Status  07/15/2020 4.4 3.5 - 5.2 g/dL Final   Alkaline Phosphatase  Date Value Ref Range Status  07/15/2020 53 39 - 117 U/L Final   Alkaline phosphatase (APISO)  Date Value Ref Range Status  12/08/2021 63 31 - 125 U/L Final   ALT  Date Value Ref Range Status  12/08/2021 11 6 - 29 U/L Final   AST  Date Value Ref Range Status  12/08/2021 18 10 - 35 U/L Final   BUN  Date Value Ref Range Status  12/08/2021 16 7 - 25 mg/dL Final   Calcium  Date Value Ref Range Status  12/08/2021 9.0 8.6 - 10.2 mg/dL Final   CO2  Date Value Ref Range Status  12/08/2021  26 20 - 32 mmol/L Final   Creat  Date Value Ref Range Status  12/08/2021 1.30 (H) 0.50 - 0.99 mg/dL Final   Glucose, Bld  Date Value Ref Range Status  12/08/2021 70 65 - 139 mg/dL Final    Comment:    .        Non-fasting reference interval .    Potassium  Date Value Ref Range Status  12/08/2021 4.1 3.5 - 5.3 mmol/L Final   Sodium  Date Value Ref Range Status  12/08/2021 143 135 - 146 mmol/L Final   Total Bilirubin  Date Value Ref Range Status  12/08/2021 0.3 0.2 - 1.2 mg/dL Final   Protein, UA  Date Value Ref Range Status  11/23/2017 neg  Final   Total Protein  Date Value Ref Range Status  12/08/2021 6.6 6.1 - 8.1 g/dL Final   GFR  Date Value Ref Range Status  07/15/2020 49.68 (L) >60.00 mL/min Final    Comment:    Calculated using the CKD-EPI Creatinine Equation (2021)   eGFR  Date Value Ref Range Status  12/08/2021 52 (L) > OR = 60 mL/min/1.80m  Final    Comment:    The eGFR is based on the CKD-EPI 2021 equation. To calculate  the new eGFR from a previous Creatinine or Cystatin C result, go to https://www.kidney.org/professionals/ kdoqi/gfr%5Fcalculator          Passed - TSH in normal range and within 360 days    TSH  Date Value Ref Range Status  12/08/2021 2.27 mIU/L Final    Comment:              Reference Range .           > or = 20 Years  0.40-4.50 .                Pregnancy Ranges           First trimester    0.26-2.66           Second trimester   0.55-2.73           Third trimester    0.43-2.91          Passed - Completed PHQ-2 or PHQ-9 in the last 360 days      Passed - Last BP in normal range    BP Readings from Last 1 Encounters:  12/08/21 110/74         Passed - Last Heart Rate in normal range    Pulse Readings from Last 1 Encounters:  12/08/21 80         Passed - Valid encounter within last 6 months    Recent Outpatient Visits           4 months ago Seizure-like activity ([facility]   [facility]BT[facility] RCoralie Keens NP   8 months ago Encounter for general adult medical examination with abnormal findings   [facility]   1 year ago [facility] CenterBGl[facility] RCoralie Keens NP               ou

## 2022-05-08 MED ORDER — QUETIAPINE FUMARATE 50 MG PO TABS: 25.0000 mg | ORAL_TABLET | Freq: Two times a day (BID) | ORAL | 0 refills | Status: DC

## 2022-05-08 NOTE — Telephone Encounter (Signed)
I got this refilled for her. She is past due for an appt and needs to schedule this ASAP

## 2022-05-08 NOTE — Addendum Note (Signed)
Addended by: Jearld Fenton on: 05/08/2022 01:18 PM   Modules accepted: Orders

## 2022-05-09 ENCOUNTER — Telehealth (INDEPENDENT_AMBULATORY_CARE_PROVIDER_SITE_OTHER): Payer: Medicare (Managed Care) | Admitting: Internal Medicine

## 2022-05-09 ENCOUNTER — Encounter: Payer: Self-pay | Admitting: Internal Medicine

## 2022-05-09 DIAGNOSIS — Z6836 Body mass index (BMI) 36.0-36.9, adult: Secondary | ICD-10-CM

## 2022-05-09 DIAGNOSIS — G2581 Restless legs syndrome: Secondary | ICD-10-CM

## 2022-05-09 DIAGNOSIS — F419 Anxiety disorder, unspecified: Secondary | ICD-10-CM

## 2022-05-09 DIAGNOSIS — G43819 Other migraine, intractable, without status migrainosus: Secondary | ICD-10-CM

## 2022-05-09 DIAGNOSIS — F32A Depression, unspecified: Secondary | ICD-10-CM

## 2022-05-09 DIAGNOSIS — I2589 Other forms of chronic ischemic heart disease: Secondary | ICD-10-CM | POA: Diagnosis not present

## 2022-05-09 DIAGNOSIS — N1831 Chronic kidney disease, stage 3a: Secondary | ICD-10-CM | POA: Diagnosis not present

## 2022-05-09 DIAGNOSIS — E66812 Obesity, class 2: Secondary | ICD-10-CM

## 2022-05-09 DIAGNOSIS — I2585 Chronic coronary microvascular dysfunction: Secondary | ICD-10-CM

## 2022-05-09 DIAGNOSIS — F418 Other specified anxiety disorders: Secondary | ICD-10-CM

## 2022-05-09 DIAGNOSIS — N189 Chronic kidney disease, unspecified: Secondary | ICD-10-CM | POA: Insufficient documentation

## 2022-05-09 NOTE — Progress Notes (Signed)
Virtual Visit via Video Note  I connected with Dana Miller on 05/09/22 at  4:00 PM EDT by a video enabled telemedicine application and verified that I am speaking with the correct person using two identifiers.  Location: Patient: Home Provider: Office  Persons participating in this video call: Nicki Reaper, NP and Dana Miller   I discussed the limitations of evaluation and management by telemedicine and the availability of in person appointments. The patient expressed understanding and agreed to proceed.  History of Present Illness:  Patient due for follow-up of chronic conditions.  Anxiety and Depression: Chronic, managed on Venlafaxine, Buspirone, Seroquel and Hydroxyzine.  She is not currently seeing a therapist or psychiatrist.  She denies SI/HI.  Chronic Microvascular Heart Disease:  Her last LDL was 72, triglycerides 74, 08/2021.  She does intermittently report chest pain.  She is taking Amlodipine, Carvedilol, Aspirin, Atorvastatin and Ranexa as prescribed.  She does follow with cardiology.  Migraines: These occur a few times a week, triggered by stress.  She is taking Topamax and Imitrex as prescribed.  She does not follow with neurology.  RLS: Managed with Ropinirole.  She does not follow with neurology.  CKD: Her last creatinine was 1.30, GFR 52, 11/2021.  She is not currently take an ACEI/ARB for renal protection.  She does not follow with nephrology.    Past Medical History:  Diagnosis Date   Anxiety    Cardiac microvascular disease    Chicken pox    Depression    Thyroid mass    right side    Current Outpatient Medications  Medication Sig Dispense Refill   albuterol (VENTOLIN HFA) 108 (90 Base) MCG/ACT inhaler Inhale 2 puffs into the lungs every 6 (six) hours as needed for wheezing or shortness of breath. 8 g 0   amLODipine (NORVASC) 10 MG tablet Take by mouth.     aspirin EC 81 MG tablet Take 1 tablet by mouth daily.     atorvastatin (LIPITOR) 20 MG tablet  Take 1 tablet by mouth daily. 90 tablet 1   busPIRone (BUSPAR) 5 MG tablet Take 1 tablet by mouth twice daily. 180 tablet 0   carvedilol (COREG) 25 MG tablet Take 25 mg by mouth 2 (two) times daily.     EPINEPHrine 0.3 mg/0.3 mL IJ SOAJ injection Inject 0.3 mLs (0.3 mg total) into the muscle as needed. For Anaphylaxis 1 Device 0   fexofenadine (ALLEGRA) 180 MG tablet Take 1 tablet by mouth daily.     hydrOXYzine (ATARAX) 10 MG tablet Take 1 tablet by mouth twice daily as needed. 180 tablet 0   nitroGLYCERIN (NITROSTAT) 0.4 MG SL tablet Place 1 tablet under the tongue every 5 minutes as needed for chest pain. Maximum daily dose is 3 tablets. 25 tablet 0   pramipexole (MIRAPEX) 0.5 MG tablet Take 1 tablet by mouth at bedtime. 90 tablet 0   QUEtiapine (SEROQUEL) 50 MG tablet Take 0.5-1 tablets (25-50 mg total) by mouth 2 (two) times daily. 60 tablet 0   ranolazine (RANEXA) 500 MG 12 hr tablet Take 2 tablets by mouth daily. 180 tablet 0   Selenium Sulfide 2.25 % SHAM Apply 1 application topically daily as needed. 1 Bottle 3   SUMAtriptan (IMITREX) 25 MG tablet TAKE 1 TABLET( 25 MG TOTAL) BY MOUTH EVERY 2 HOURS AS NEEDED FOR MIGRAINE. MAY REPEAT IN 2 HOURS IF HEADACHE PERSISTS OR RECURS 10 tablet 0   topiramate (TOPAMAX) 100 MG tablet TAKE 1 TABLET(100 MG) BY MOUTH DAILY  90 tablet 1   venlafaxine XR (EFFEXOR-XR) 75 MG 24 hr capsule Take 3 capsules by mouth daily with breakfast. 270 capsule 0   No current facility-administered medications for this visit.    Allergies  Allergen Reactions   Fentanyl Swelling   Mirtazapine Swelling   Penicillins Anaphylaxis   Sulfa Antibiotics Hives   Ms Contin  [Morphine] Swelling    Family History  Problem Relation Age of Onset   Stroke Father    Cancer Father        Kidney    AAA (abdominal aortic aneurysm) Father    Depression Sister    Anxiety disorder Sister    Cancer Paternal Uncle        Lung   Depression Maternal Grandmother    Heart disease  Paternal Grandmother    Stroke Paternal Grandmother    Diabetes Paternal Grandmother    Diabetes Paternal Grandfather     Social History   Socioeconomic History   Marital status: Married    Spouse name: Not on file   Number of children: Not on file   Years of education: Not on file   Highest education level: Not on file  Occupational History   Not on file  Tobacco Use   Smoking status: Every Day    Packs/day: 0.00    Types: Cigarettes, E-cigarettes   Smokeless tobacco: Never  Vaping Use   Vaping Use: Some days   Substances: Nicotine, Flavoring  Substance and Sexual Activity   Alcohol use: Yes    Alcohol/week: 0.0 standard drinks of alcohol    Comment: occasional   Drug use: No   Sexual activity: Yes  Other Topics Concern   Not on file  Social History Narrative   Not on file   Social Determinants of Health   Financial Resource Strain: Not on file  Food Insecurity: Not on file  Transportation Needs: Not on file  Physical Activity: Not on file  Stress: Not on file  Social Connections: Not on file  Intimate Partner Violence: Not on file     Constitutional: Patient reports intermittent headaches.  Denies fever, malaise, fatigue, or abrupt weight changes.  HEENT: Denies eye pain, eye redness, ear pain, ringing in the ears, wax buildup, runny nose, nasal congestion, bloody nose, or sore throat. Respiratory: Denies difficulty breathing, shortness of breath, cough or sputum production.   Cardiovascular: Patient reports intermittent chest pain.  Denies chest tightness, palpitations or swelling in the hands or feet.  Gastrointestinal: Denies abdominal pain, bloating, constipation, diarrhea or blood in the stool.  GU: Denies urgency, frequency, pain with urination, burning sensation, blood in urine, odor or discharge. Musculoskeletal: Denies decrease in range of motion, difficulty with gait, muscle pain or joint pain and swelling.  Skin: Denies redness, rashes, lesions or  ulcercations.  Neurological: Patient reports restless legs.  Denies dizziness, difficulty with memory, difficulty with speech or problems with balance and coordination.  Psych: Patient has a history of anxiety and depression.  Denies SI/HI.  No other specific complaints in a complete review of systems (except as listed in HPI above).  Observations/Objective:   Wt Readings from Last 3 Encounters:  12/08/21 224 lb (101.6 kg)  08/26/21 222 lb (100.7 kg)  07/15/20 198 lb (89.8 kg)    General: Appears her stated age,obese, in NAD. Skin: Warm, dry and intact. Pulmonary/Chest: Normal effort. No respiratory distress.  Neurological: Alert and oriented.  Psychiatric: Mood and affect normal. Behavior is normal. Judgment and thought content normal.  BMET    Component Value Date/Time   NA 143 12/08/2021 1457   K 4.1 12/08/2021 1457   CL 109 12/08/2021 1457   CO2 26 12/08/2021 1457   GLUCOSE 70 12/08/2021 1457   BUN 16 12/08/2021 1457   CREATININE 1.30 (H) 12/08/2021 1457   CALCIUM 9.0 12/08/2021 1457    Lipid Panel     Component Value Date/Time   CHOL 176 08/26/2021 1509   TRIG 74 08/26/2021 1509   HDL 89 08/26/2021 1509   CHOLHDL 2.0 08/26/2021 1509   VLDL 19.6 07/15/2020 1611   LDLCALC 72 08/26/2021 1509    CBC    Component Value Date/Time   WBC 6.5 12/08/2021 1457   RBC 3.86 12/08/2021 1457   HGB 12.9 12/08/2021 1457   HCT 39.4 12/08/2021 1457   PLT 350 12/08/2021 1457   MCV 102.1 (H) 12/08/2021 1457   MCH 33.4 (H) 12/08/2021 1457   MCHC 32.7 12/08/2021 1457   RDW 12.8 12/08/2021 1457   LYMPHSABS 1.4 07/20/2015 0926   MONOABS 0.5 07/20/2015 0926   EOSABS 0.3 07/20/2015 0926   BASOSABS 0.0 07/20/2015 0926    Hgb A1C Lab Results  Component Value Date   HGBA1C 5.0 08/26/2021        Assessment and Plan:  RTC in 6 months for your annual exam  Follow Up Instructions:    I discussed the assessment and treatment plan with the patient. The patient was  provided an opportunity to ask questions and all were answered. The patient agreed with the plan and demonstrated an understanding of the instructions.   The patient was advised to call back or seek an in-person evaluation if the symptoms worsen or if the condition fails to improve as anticipated.   Nicki Reaper, NP

## 2022-05-09 NOTE — Patient Instructions (Signed)

## 2022-05-09 NOTE — Assessment & Plan Note (Signed)
Encouraged diet and exercise for weight loss ?

## 2022-05-09 NOTE — Assessment & Plan Note (Signed)
Continue amlodipine, carvedilol, aspirin, atorvastatin and ranxexa Will have her schedule a lab only appt for cmet and lipid profile

## 2022-05-09 NOTE — Assessment & Plan Note (Signed)
Managed on topomax She does not take imitrex, will d/c

## 2022-05-09 NOTE — Assessment & Plan Note (Signed)
Continue ropinerole

## 2022-05-09 NOTE — Assessment & Plan Note (Signed)
Continue venlafaxine, buspirone, seroquel and hydroxyzine Support offered

## 2022-05-09 NOTE — Assessment & Plan Note (Signed)
Will check CMET Not currently on ACEI/ARB

## 2022-05-15 ENCOUNTER — Other Ambulatory Visit: Payer: Self-pay | Admitting: Internal Medicine

## 2022-05-16 NOTE — Telephone Encounter (Signed)
Requested Prescriptions  Pending Prescriptions Disp Refills  . nitroGLYCERIN (NITROSTAT) 0.4 MG SL tablet [Pharmacy Med Name: Nitroglycerin 0.4mg  Sublingual Tablet] 25 tablet 0    Sig: Dissolve 1 tablet under the tongue every 5 minutes as needed for chest pain. Maximum daily dose is 3 tablets.     Cardiovascular:  Nitrates Passed - 05/15/2022 12:49 PM      Passed - Last BP in normal range    BP Readings from Last 1 Encounters:  12/08/21 110/74         Passed - Last Heart Rate in normal range    Pulse Readings from Last 1 Encounters:  12/08/21 80         Passed - Valid encounter within last 12 months    Recent Outpatient Visits          1 week ago Cardiac microvascular disease   [place], Coralie Keens, NP   5 months ago Seizure-like activity [facility])   [facility] [place], Coralie Keens, NP   8 months ago Encounter for general adult medical examination with abnormal findings   [facility] [place], Coralie Keens, NP   1 year ago [facility] [place], Coralie Keens, [place]

## 2022-06-02 ENCOUNTER — Other Ambulatory Visit: Payer: Self-pay | Admitting: Internal Medicine

## 2022-06-02 NOTE — Telephone Encounter (Signed)
Requested medication (s) are due for refill today:   Provider to review  Requested medication (s) are on the active medication list:   Yes  Future visit scheduled:   No just seen 3 wks ago   Last ordered: 05/08/2022 #60, 0 refills  Returned because it's a non delegated refill   Requested Prescriptions  Pending Prescriptions Disp Refills   QUEtiapine (SEROQUEL) 50 MG tablet [Pharmacy Med Name: Quetiapine Fumarate 82m Tablet] 60 tablet 0    Sig: Take 1/2 to 1 tablet by mouth twice daily.     Not Delegated - Psychiatry:  Antipsychotics - Second Generation (Atypical) - quetiapine Failed - 06/02/2022  3:35 PM      Failed - This refill cannot be delegated      Failed - Lipid Panel in normal range within the last 12 months    Cholesterol  Date Value Ref Range Status  08/26/2021 176 <200 mg/dL Final   LDL Cholesterol (Calc)  Date Value Ref Range Status  08/26/2021 72 mg/dL (calc) Final    Comment:    Reference range: <100 . Desirable range <100 mg/dL for primary prevention;   <70 mg/dL for patients with CHD or diabetic patients  with > or = 2 CHD risk factors. .Marland KitchenLDL-C is now calculated using the Martin-Hopkins  calculation, which is a validated novel method providing  better accuracy than the Friedewald equation in the  estimation of LDL-C.  MCresenciano Genreet al. JAnnamaria Helling [id number];665(99: 2061-2068  (http://education.[employer].com/faq/FAQ164)    HDL  Date Value Ref Range Status  08/26/2021 89 > OR = 50 mg/dL Final   Triglycerides  Date Value Ref Range Status  08/26/2021 74 <150 mg/dL Final         Failed - CBC within normal limits and completed in the last 12 months    WBC  Date Value Ref Range Status  12/08/2021 6.5 3.8 - 10.8 Thousand/uL Final   RBC  Date Value Ref Range Status  12/08/2021 3.86 3.80 - 5.10 Million/uL Final   Hemoglobin  Date Value Ref Range Status  12/08/2021 12.9 11.7 - 15.5 g/dL Final   HCT  Date Value Ref Range Status  12/08/2021 39.4 35.0  - 45.0 % Final   MCHC  Date Value Ref Range Status  12/08/2021 32.7 32.0 - 36.0 g/dL Final   [facility] Date Value Ref Range Status  12/08/2021 33.4 (H) 27.0 - 33.0 pg Final   MCV  Date Value Ref Range Status  12/08/2021 102.1 (H) 80.0 - 100.0 fL Final   No results found for: "PLTCOUNTKUC", "LABPLAT", "POCPLA" RDW  Date Value Ref Range Status  12/08/2021 12.8 11.0 - 15.0 % Final         Failed - CMP within normal limits and completed in the last 12 months    Albumin  Date Value Ref Range Status  07/15/2020 4.4 3.5 - 5.2 g/dL Final   Alkaline Phosphatase  Date Value Ref Range Status  07/15/2020 53 39 - 117 U/L Final   Alkaline phosphatase (APISO)  Date Value Ref Range Status  12/08/2021 63 31 - 125 U/L Final   ALT  Date Value Ref Range Status  12/08/2021 11 6 - 29 U/L Final   AST  Date Value Ref Range Status  12/08/2021 18 10 - 35 U/L Final   BUN  Date Value Ref Range Status  12/08/2021 16 7 - 25 mg/dL Final   Calcium  Date Value Ref Range Status  12/08/2021 9.0 8.6 - 10.2 mg/dL  Final   CO2  Date Value Ref Range Status  12/08/2021 26 20 - 32 mmol/L Final   Creat  Date Value Ref Range Status  12/08/2021 1.30 (H) 0.50 - 0.99 mg/dL Final   Glucose, Bld  Date Value Ref Range Status  12/08/2021 70 65 - 139 mg/dL Final    Comment:    .        Non-fasting reference interval .    Potassium  Date Value Ref Range Status  12/08/2021 4.1 3.5 - 5.3 mmol/L Final   Sodium  Date Value Ref Range Status  12/08/2021 143 135 - 146 mmol/L Final   Total Bilirubin  Date Value Ref Range Status  12/08/2021 0.3 0.2 - 1.2 mg/dL Final   Protein, UA  Date Value Ref Range Status  11/23/2017 neg  Final   Total Protein  Date Value Ref Range Status  12/08/2021 6.6 6.1 - 8.1 g/dL Final   GFR  Date Value Ref Range Status  07/15/2020 49.68 (L) >60.00 mL/min Final    Comment:    Calculated using the CKD-EPI Creatinine Equation (2021)   eGFR  Date Value Ref Range  Status  12/08/2021 52 (L) > OR = 60 mL/min/1.32m Final    Comment:    The eGFR is based on the CKD-EPI 2021 equation. To calculate  the new eGFR from a previous Creatinine or Cystatin C result, go to https://www.kidney.org/professionals/ kdoqi/gfr%5Fcalculator          Passed - TSH in normal range and within 360 days    TSH  Date Value Ref Range Status  12/08/2021 2.27 mIU/L Final    Comment:              Reference Range .           > or = 20 Years  0.40-4.50 .                Pregnancy Ranges           First trimester    0.26-2.66           Second trimester   0.55-2.73           Third trimester    0.43-2.91          Passed - Completed PHQ-2 or PHQ-9 in the last 360 days      Passed - Last BP in normal range    BP Readings from Last 1 Encounters:  12/08/21 110/74         Passed - Last Heart Rate in normal range    Pulse Readings from Last 1 Encounters:  12/08/21 80         Passed - Valid encounter within last 6 months    Recent Outpatient Visits           3 weeks ago Cardiac microvascular disease   [place]   5 months ago [facility]izure-[facility] ([facility]   9 months ago Encounter for general adult medical examination with abnormal findings   [facility]   [facility] [place] [place] [place]

## 2022-06-30 ENCOUNTER — Other Ambulatory Visit: Payer: Self-pay | Admitting: Internal Medicine

## 2022-07-02 ENCOUNTER — Other Ambulatory Visit: Payer: Self-pay | Admitting: Internal Medicine

## 2022-07-02 DIAGNOSIS — F419 Anxiety disorder, unspecified: Secondary | ICD-10-CM

## 2022-07-02 DIAGNOSIS — R252 Cramp and spasm: Secondary | ICD-10-CM

## 2022-07-03 NOTE — Telephone Encounter (Signed)
Requested medication (s) are due for refill today:   Provider to review  Requested medication (s) are on the active medication list:   Yes  Future visit scheduled:   No   Seen a month ago by [place].   Last ordered: 05/16/2022 #25, 0 refills  Returned for provider to review.  Seems to be a lot of Nitroglycerine use since 05/16/2022.  I'm erring on the side of safety.   Requested Prescriptions  Pending Prescriptions Disp Refills   nitroGLYCERIN (NITROSTAT) 0.4 MG SL tablet [Pharmacy Med Name: Nitroglycerin 0.4mg  Sublingual Tablet] 25 tablet 0    Sig: Dissolve 1 tablet under the tongue every 5 minutes as needed for chest pain. Maximum daily dose is 3 tablets.     Cardiovascular:  Nitrates Passed - 06/30/2022  4:41 PM      Passed - Last BP in normal range    BP Readings from Last 1 Encounters:  12/08/21 110/74         Passed - Last Heart Rate in normal range    Pulse Readings from Last 1 Encounters:  12/08/21 80         Passed - Valid encounter within last 12 months    Recent Outpatient Visits           1 month ago Cardiac microvascular disease   [facility] [place], Salvadore Oxford, NP   6 months ago Seizure-like activity [facility])   [facility] [place], Salvadore Oxford, NP   10 months ago Encounter for general adult medical examination with abnormal findings   [facility] [place], Salvadore Oxford, NP   1 year ago COVID-19   [facility] [place], Salvadore Oxford, [place]

## 2022-07-03 NOTE — Telephone Encounter (Signed)
Requested Prescriptions  Pending Prescriptions Disp Refills   pramipexole (MIRAPEX) 0.5 MG tablet [Pharmacy Med Name: Pramipexole Dihydrochloride 0.31m Tablet] 90 tablet 0    Sig: Take 1 tablet by mouth at bedtime.     Neurology:  Parkinsonian Agents Passed - 07/02/2022  9:05 PM      Passed - Last BP in normal range    BP Readings from Last 1 Encounters:  12/08/21 110/74         Passed - Last Heart Rate in normal range    Pulse Readings from Last 1 Encounters:  12/08/21 80         Passed - Valid encounter within last 12 months    Recent Outpatient Visits           1 month ago Cardiac microvascular disease   [facility]   6 months ago Seizure-like activity ([facility]   [facility]   10 months ago Encounter for general adult medical examination with abnormal findings   [facility]   1 [facility]               QUEtiapine (SEROQUEL) 50 MG tablet [Pharmacy Med Name: Quetiapine Fumarate 517mTablet] 180 tablet     Sig: Take 1/2 to 1 tablet by mouth twice daily.     Not Delegated - Psychiatry:  Antipsychotics - Second Generation (Atypical) - quetiapine Failed - 07/02/2022  9:05 PM      Failed - This refill cannot be delegated      Failed - Lipid Panel in normal range within the last 12 months    Cholesterol  Date Value Ref Range Status  08/26/2021 176 <200 mg/dL Final   LDL Cholesterol (Calc)  Date Value Ref Range Status  08/26/2021 72 mg/dL (calc) Final    Comment:    Reference range: <100 . Desirable range <100 mg/dL for primary prevention;   <70 mg/dL for patients with CHD or diabetic patients  with > or = 2 CHD risk factors. . Marland KitchenDL-C is now calculated using the Martin-Hopkins  calculation, which is a validated novel method providing  better accuracy than the Friedewald equation in the  estimation of  LDL-C.  MaCresenciano Genret al. JAAnnamaria [id number];022(33 [id number]  (http://education.[facility].com/faq/FAQ164)    HDL  Date Value Ref Range Status  08/26/2021 89 > OR = 50 mg/dL Final   Triglycerides  Date Value Ref Range Status  08/26/2021 74 <150 mg/dL Final         Failed - CBC within normal limits and completed in the last 12 months    WBC  Date Value Ref Range Status  12/08/2021 6.5 3.8 - 10.8 Thousand/uL Final   RBC  Date Value Ref Range Status  12/08/2021 3.86 3.80 - 5.10 Million/uL Final   Hemoglobin  Date Value Ref Range Status  12/08/2021 12.9 11.7 - 15.5 g/dL Final   HCT  Date Value Ref Range Status  12/08/2021 39.4 35.0 - 45.0 % Final   MCHC  Date Value Ref Range Status  12/08/2021 32.7 32.0 - 36.0 g/dL Final   [facility] Range Status  12/08/2021 33.4 (H) 27.0 - 33.0 pg Final   MCV  Date Value Ref Range Status  12/08/2021 102.1 (H) 80.0 - 100.0 fL Final   No results found for: "PLTCOUNTKUC", "LABPLAT", "POCPLA" RDW  Date Value Ref Range Status  12/08/2021 12.8 11.0 - 15.0 % Final         Failed - CMP within normal limits and completed in the last 12 months    Albumin  Date Value Ref Range Status  07/15/2020 4.4 3.5 - 5.2 g/dL Final   Alkaline Phosphatase  Date Value Ref Range Status  07/15/2020 53 39 - 117 U/L Final   Alkaline phosphatase (APISO)  Date Value Ref Range Status  12/08/2021 63 31 - 125 U/L Final   ALT  Date Value Ref Range Status  12/08/2021 11 6 - 29 U/L Final   AST  Date Value Ref Range Status  12/08/2021 18 10 - 35 U/L Final   BUN  Date Value Ref Range Status  12/08/2021 16 7 - 25 mg/dL Final   Calcium  Date Value Ref Range Status  12/08/2021 9.0 8.6 - 10.2 mg/dL Final   CO2  Date Value Ref Range Status  12/08/2021 26 20 - 32 mmol/L Final   Creat  Date Value Ref Range Status  12/08/2021 1.30 (H) 0.50 - 0.99 mg/dL Final   Glucose, Bld  Date Value Ref Range Status  12/08/2021 70 65 - 139 mg/dL  Final    Comment:    .        Non-fasting reference interval .    Potassium  Date Value Ref Range Status  12/08/2021 4.1 3.5 - 5.3 mmol/L Final   Sodium  Date Value Ref Range Status  12/08/2021 143 135 - 146 mmol/L Final   Total Bilirubin  Date Value Ref Range Status  12/08/2021 0.3 0.2 - 1.2 mg/dL Final   Protein, UA  Date Value Ref Range Status  11/23/2017 neg  Final   Total Protein  Date Value Ref Range Status  12/08/2021 6.6 6.1 - 8.1 g/dL Final   GFR  Date Value Ref Range Status  07/15/2020 49.68 (L) >60.00 mL/min Final    Comment:    Calculated using the CKD-EPI Creatinine Equation (2021)   eGFR  Date Value Ref Range Status  12/08/2021 52 (L) > OR = 60 mL/min/1.7m Final    Comment:    The eGFR is based on the CKD-EPI 2021 equation. To calculate  the new eGFR from a previous Creatinine or Cystatin C result, go to https://www.kidney.org/professionals/ kdoqi/gfr%5Fcalculator          Passed - TSH in normal range and within 360 days    TSH  Date Value Ref Range Status  12/08/2021 2.27 mIU/L Final    Comment:              Reference Range .           > or = 20 Years  0.40-4.50 .                Pregnancy Ranges           First trimester    0.26-2.66           Second trimester   0.55-2.73           Third trimester    0.43-2.91          Passed - Completed PHQ-2 or PHQ-9 in the last 360 days      Passed - Last BP in normal range    BP Readings from Last 1 Encounters:  12/08/21 110/74         Passed - Last Heart Rate in normal range    Pulse Readings  from Last 1 Encounters:  12/08/21 80         Passed - Valid encounter within last 6 months    Recent Outpatient Visits           1 month ago Cardiac microvascular disease   [facility], Coralie Keens, NP   6 months ago Seizure-like activity [facility])   [facility], Coralie Keens, NP   10 months ago Encounter for general adult medical examination with abnormal  findings   [facility], Coralie Keens, NP   1 year ago [facility] [place], Coralie Keens, NP               hydrOXYzine (ATARAX) 10 MG tablet [Pharmacy Med Name: Hydroxyzine Hydrochloride 20m Tablet] 180 tablet 0    Sig: Take 1 tablet by mouth twice daily as needed.     Ear, Nose, and Throat:  Antihistamines 2 Failed - 07/02/2022  9:05 PM      Failed - Cr in normal range and within 360 days    Creat  Date Value Ref Range Status  12/08/2021 1.30 (H) 0.50 - 0.99 mg/dL Final         Passed - Valid encounter within last 12 months    Recent Outpatient Visits           1 month ago Cardiac microvascular disease   [facility] [place] [facility]   6 months ago Seizure-like activity ([facility]   [facility]   10 months ago Encounter for general adult medical examination with abnormal findings   [facility]   1 year ago [facility]               venlafaxine XR (EFFEXOR-XR) 75 MG 24 hr capsule [Pharmacy Med Name: Venlafaxine Hydrochloride 760mExtended-Release Capsule] 270 capsule 0    Sig: Take 3 capsules by mouth daily with breakfast.     Psychiatry: Antidepressants - SNRI - desvenlafaxine & venlafaxine Failed - 07/02/2022  9:05 PM      Failed - Cr in normal range and within 360 days    Creat  Date Value Ref Range Status  12/08/2021 1.30 (H) 0.50 - 0.99 mg/dL Final         Failed - Lipid Panel in normal range within the last 12 months    Cholesterol  Date Value Ref Range Status  08/26/2021 176 <200 mg/dL Final   LDL Cholesterol (Calc)  Date Value Ref Range Status  08/26/2021 72 mg/dL (calc) Final    Comment:    Reference range: <100 . Desirable range <100 mg/dL for primary prevention;   <70 mg/dL for patients with CHD or diabetic patients  with > or = 2 CHD risk factors. . Marland KitchenDL-C is now  calculated using the Martin-Hopkins  calculation, which is a validated novel method providing  better accuracy than the Friedewald equation in the  estimation of LDL-C.  MaCresenciano Genret al. JAAnnamaria [id number];233(00 [id number]  (http://education.[facility].com/faq/FAQ164)    HDL  Date Value Ref Range Status  08/26/2021 89 > OR = 50 mg/dL Final   Triglycerides  Date Value Ref Range Status  08/26/2021 74 <150 mg/dL Final         Passed - Completed PHQ-2 or PHQ-9 in the last 360 days  Passed - Last BP in normal range    BP Readings from Last 1 Encounters:  12/08/21 110/74         Passed - Valid encounter within last 6 months    Recent Outpatient Visits           1 month ago Cardiac microvascular disease   [place], Coralie Keens, NP   6 months ago Seizure-like activity [facility])   [facility], Coralie Keens, NP   10 months ago Encounter for general adult medical examination with abnormal findings   [facility], Coralie Keens, NP   1 year ago [facility] [place], Coralie Keens, NP               busPIRone (BUSPAR) 5 MG tablet [place] Med Name: Buspirone Hydrochloride 21m Tablet] 180 tablet 0    Sig: Take 1 tablet by mouth twice daily.     Psychiatry: Anxiolytics/Hypnotics - Non-controlled Passed - 07/02/2022  9:05 PM      Passed - Valid encounter within last 12 months    Recent Outpatient Visits           1 month ago Cardiac microvascular disease   [place] [facility]   6 months ago [facility]izure-like [facility] ([facility]   [facility]   10 months ago Encounter for general adult medical examination with abnormal findings   [facility]   1 year ago [facility]

## 2022-07-03 NOTE — Telephone Encounter (Signed)
Requested medication (s) are due for refill today: no  Requested medication (s) are on the active medication list: yes  Last refill:  06/05/22 #180  Future visit scheduled: no  Notes to clinic:  med not delegated to NT to Reorder   Requested Prescriptions  Pending Prescriptions Disp Refills   QUEtiapine (SEROQUEL) 50 MG tablet [Pharmacy Med Name: Quetiapine Fumarate 75m Tablet] 180 tablet     Sig: Take 1/2 to 1 tablet by mouth twice daily.     Not Delegated - Psychiatry:  Antipsychotics - Second Generation (Atypical) - quetiapine Failed - 07/02/2022  9:05 PM      Failed - This refill cannot be delegated      Failed - Lipid Panel in normal range within the last 12 months    Cholesterol  Date Value Ref Range Status  08/26/2021 176 <200 mg/dL Final   LDL Cholesterol (Calc)  Date Value Ref Range Status  08/26/2021 72 mg/dL (calc) Final    Comment:    Reference range: <100 . Desirable range <100 mg/dL for primary prevention;   <70 mg/dL for patients with CHD or diabetic patients  with > or = 2 CHD risk factors. .Marland KitchenLDL-C is now calculated using the Martin-Hopkins  calculation, which is a validated novel method providing  better accuracy than the Friedewald equation in the  estimation of LDL-C.  MCresenciano Genreet al. JAnnamaria Helling 20388;828(00: [id number]  (http://education.[facility].com/faq/FAQ164)    HDL  Date Value Ref Range Status  08/26/2021 89 > OR = 50 mg/dL Final   Triglycerides  Date Value Ref Range Status  08/26/2021 74 <150 mg/dL Final         Failed - CBC within normal limits and completed in the last 12 months    WBC  Date Value Ref Range Status  12/08/2021 6.5 3.8 - 10.8 Thousand/uL Final   RBC  Date Value Ref Range Status  12/08/2021 3.86 3.80 - 5.10 Million/uL Final   Hemoglobin  Date Value Ref Range Status  12/08/2021 12.9 11.7 - 15.5 g/dL Final   HCT  Date Value Ref Range Status  12/08/2021 39.4 35.0 - 45.0 % Final   MCHC  Date Value Ref  Range Status  12/08/2021 32.7 32.0 - 36.0 g/dL Final   [facility] Date Value Ref Range Status  12/08/2021 33.4 (H) 27.0 - 33.0 pg Final   MCV  Date Value Ref Range Status  12/08/2021 102.1 (H) 80.0 - 100.0 fL Final   No results found for: "PLTCOUNTKUC", "LABPLAT", "POCPLA" RDW  Date Value Ref Range Status  12/08/2021 12.8 11.0 - 15.0 % Final         Failed - CMP within normal limits and completed in the last 12 months    Albumin  Date Value Ref Range Status  07/15/2020 4.4 3.5 - 5.2 g/dL Final   Alkaline Phosphatase  Date Value Ref Range Status  07/15/2020 53 39 - 117 U/L Final   Alkaline phosphatase (APISO)  Date Value Ref Range Status  12/08/2021 63 31 - 125 U/L Final   ALT  Date Value Ref Range Status  12/08/2021 11 6 - 29 U/L Final   AST  Date Value Ref Range Status  12/08/2021 18 10 - 35 U/L Final   BUN  Date Value Ref Range Status  12/08/2021 16 7 - 25 mg/dL Final   Calcium  Date Value Ref Range Status  12/08/2021 9.0 8.6 - 10.2 mg/dL Final   CO2  Date Value Ref Range Status  12/08/2021 26 20 - 32 mmol/L Final   Creat  Date Value Ref Range Status  12/08/2021 1.30 (H) 0.50 - 0.99 mg/dL Final   Glucose, Bld  Date Value Ref Range Status  12/08/2021 70 65 - 139 mg/dL Final    Comment:    .        Non-fasting reference interval .    Potassium  Date Value Ref Range Status  12/08/2021 4.1 3.5 - 5.3 mmol/L Final   Sodium  Date Value Ref Range Status  12/08/2021 143 135 - 146 mmol/L Final   Total Bilirubin  Date Value Ref Range Status  12/08/2021 0.3 0.2 - 1.2 mg/dL Final   Protein, UA  Date Value Ref Range Status  11/23/2017 neg  Final   Total Protein  Date Value Ref Range Status  12/08/2021 6.6 6.1 - 8.1 g/dL Final   GFR  Date Value Ref Range Status  07/15/2020 49.68 (L) >60.00 mL/min Final    Comment:    Calculated using the CKD-EPI Creatinine Equation (2021)   eGFR  Date Value Ref Range Status  12/08/2021 52 (L) > OR = 60  mL/min/1.36m Final    Comment:    The eGFR is based on the CKD-EPI 2021 equation. To calculate  the new eGFR from a previous Creatinine or Cystatin C result, go to https://www.kidney.org/professionals/ kdoqi/gfr%5Fcalculator          Passed - TSH in normal range and within 360 days    TSH  Date Value Ref Range Status  12/08/2021 2.27 mIU/L Final    Comment:              Reference Range .           > or = 20 Years  0.40-4.50 .                Pregnancy Ranges           First trimester    0.26-2.66           Second trimester   0.55-2.73           Third trimester    0.43-2.91          Passed - Completed PHQ-2 or PHQ-9 in the last 360 days      Passed - Last BP in normal range    BP Readings from Last 1 Encounters:  12/08/21 110/74         Passed - Last Heart Rate in normal range    Pulse Readings from Last 1 Encounters:  12/08/21 80         Passed - Valid encounter within last 6 months    Recent Outpatient Visits           1 month ago Cardiac microvascular disease   [facility]   6 months ago [facility] ([facility]   [facility]   10 months ago Encounter for general adult medical examination with abnormal findings   [facility] [place] [facility]   1 year ago [facility] [place] [facility]              Signed Prescriptions Disp Refills   pramipexole (MIRAPEX) 0.5 MG tablet 90 tablet 0    Sig: Take 1 tablet by mouth at bedtime.     Neurology:  Parkinsonian Agents Passed - 07/02/2022  9:05 PM  Passed - Last BP in normal range    BP Readings from Last 1 Encounters:  12/08/21 110/74         Passed - Last Heart Rate in normal range    Pulse Readings from Last 1 Encounters:  12/08/21 80         Passed - Valid encounter within last 12 months    Recent Outpatient Visits           1 month ago Cardiac microvascular disease    [facility], Coralie Keens, NP   6 months ago Seizure-like activity [facility])   [facility], Coralie Keens, NP   10 months ago Encounter for general adult medical examination with abnormal findings   [facility], Coralie Keens, NP   1 year ago [facility], Coralie Keens, NP               hydrOXYzine (ATARAX) 10 MG tablet 180 tablet 0    Sig: Take 1 tablet by mouth twice daily as needed.     Ear, Nose, and Throat:  Antihistamines 2 Failed - 07/02/2022  9:05 PM      Failed - Cr in normal range and within 360 days    Creat  Date Value Ref Range Status  12/08/2021 1.30 (H) 0.50 - 0.99 mg/dL Final         Passed - Valid encounter within last 12 months    Recent Outpatient Visits           1 month ago Cardiac microvascular disease   [facility] [place], Coralie Keens, NP   6 months ago Seizure-like activity [facility])   [facility], Coralie Keens, NP   10 months ago Encounter for general adult medical examination with abnormal findings   [facility], Coralie Keens, NP   1 year ago [facility] [place], Coralie Keens, NP               venlafaxine XR (EFFEXOR-XR) 75 MG 24 hr capsule 270 capsule 0    Sig: Take 3 capsules by mouth daily with breakfast.     Psychiatry: Antidepressants - SNRI - desvenlafaxine & venlafaxine Failed - 07/02/2022  9:05 PM      Failed - Cr in normal range and within 360 days    Creat  Date Value Ref Range Status  12/08/2021 1.30 (H) 0.50 - 0.99 mg/dL Final         Failed - Lipid Panel in normal range within the last 12 months    Cholesterol  Date Value Ref Range Status  08/26/2021 176 <200 mg/dL Final   LDL Cholesterol (Calc)  Date Value Ref Range Status  08/26/2021 72 mg/dL (calc) Final    Comment:    Reference range: <100 . Desirable range <100 mg/dL for primary prevention;   <70 mg/dL for patients  with CHD or diabetic patients  with > or = 2 CHD risk factors. Marland Kitchen LDL-C is now calculated using the Martin-Hopkins  calculation, which is a validated novel method providing  better accuracy than the Friedewald equation in the  estimation of LDL-C.  Cresenciano Genre et al. Annamaria Helling. 2263;335(45): 2061-2068  (http://education.[facility].com/faq/FAQ164)    HDL  Date Value Ref Range Status  08/26/2021 89 > OR = 50 mg/dL Final   Triglycerides  Date Value Ref Range Status  08/26/2021 74 <150 mg/dL  Final         Passed - Completed PHQ-2 or PHQ-9 in the last 360 days      Passed - Last BP in normal range    BP Readings from Last 1 Encounters:  12/08/21 110/74         Passed - Valid encounter within last 6 months    Recent Outpatient Visits           1 month ago Cardiac microvascular disease   [facility], Coralie Keens, NP   6 months ago Seizure-like activity [facility])   [facility], Coralie Keens, NP   10 months ago Encounter for general adult medical examination with abnormal findings   [facility], Coralie Keens, NP   1 year ago [facility] [place], Coralie Keens, NP               busPIRone (BUSPAR) 5 MG tablet 180 tablet 0    Sig: Take 1 tablet by mouth twice daily.     Psychiatry: Anxiolytics/Hypnotics - Non-controlled Passed - 07/02/2022  9:05 PM      Passed - Valid encounter within last 12 months    Recent Outpatient Visits           1 month ago Cardiac microvascular disease   [place], Coralie Keens, NP   6 months ago Seizure-like activity [facility])   [facility], Coralie Keens, NP   10 months ago Encounter for general adult medical examination with abnormal findings   [facility], Coralie Keens, NP   1 year ago [facility] [place], Coralie Keens, [place]

## 2022-07-18 DIAGNOSIS — I2089 Other forms of angina pectoris: Secondary | ICD-10-CM | POA: Diagnosis not present

## 2022-07-18 DIAGNOSIS — E875 Hyperkalemia: Secondary | ICD-10-CM | POA: Diagnosis not present

## 2022-07-18 DIAGNOSIS — G43909 Migraine, unspecified, not intractable, without status migrainosus: Secondary | ICD-10-CM | POA: Diagnosis not present

## 2022-07-18 DIAGNOSIS — R7989 Other specified abnormal findings of blood chemistry: Secondary | ICD-10-CM | POA: Diagnosis not present

## 2022-07-18 DIAGNOSIS — N1831 Chronic kidney disease, stage 3a: Secondary | ICD-10-CM | POA: Diagnosis not present

## 2022-07-20 ENCOUNTER — Encounter: Payer: Self-pay | Admitting: Neurology

## 2022-07-20 ENCOUNTER — Ambulatory Visit (INDEPENDENT_AMBULATORY_CARE_PROVIDER_SITE_OTHER): Payer: BC Managed Care – PPO | Admitting: Neurology

## 2022-07-20 VITALS — BP 117/80 | HR 74 | Ht 65.0 in | Wt 226.0 lb

## 2022-07-20 DIAGNOSIS — R569 Unspecified convulsions: Secondary | ICD-10-CM

## 2022-07-20 NOTE — Progress Notes (Signed)
[facility]  PATIENT: Dana Miller DOB: 1975/12/10  REQUESTING CLINICIAN: Lorre Munroe, NP HISTORY FROM: Patient  REASON FOR VISIT: Seizure like activity    HISTORICAL  CHIEF COMPLAINT:  Chief Complaint  Patient presents with   New Patient (Initial Visit)    Rm 13 NP internal referral for seizure-like activity,  Last seizure like activity was 6 weeks ago     HISTORY OF PRESENT ILLNESS:  This is a 46 year old woman past medical history of hypertension, hyperlipidemia, anxiety/depression, insomnia who is presenting with events concerning for seizures.  Patient reports for the past year she had a total of 6 events where she have transient alteration of her awareness.  She reports some of these events happened during sleep, that she will wake up, sit at the edge of the bed , body feels trembling and she does not recognize her bed partner.  There are times that the events also happen during the daytime.  Again described as staring spell, unresponsiveness, lasting 25 to 30 seconds with confusion.  There was no reported abnormal movements or shaking.  No tongue biting, no urinary incontinence or bowel incontinence.  She states lately she is under a lot of stress, her sleep is terrible and she also has a counselor she sees once a week.  Denies any previous history of seizures and denies any seizure risk factors.   Handedness: Right handed   Onset: A year ago   Seizure Type:   Current frequency: A total of 6 ,last one being 6 weeks ago   Any injuries from seizures: None   Seizure risk factors: None   Previous ASMs: None  Currenty ASMs: None   ASMs side effects: N/A   Brain Images: Normal   Previous EEGs: None    OTHER MEDICAL CONDITIONS: Hypertension, hyperlipidemia, Anxiety/Depression, Insomnia   REVIEW OF SYSTEMS: Full 14 system review of systems performed and negative with exception of: As noted in the HPI  ALLERGIES: Allergies  Allergen  Reactions   Fentanyl Swelling   Mirtazapine Swelling   Penicillins Anaphylaxis   Sulfa Antibiotics Hives   Ms Contin  [Morphine] Swelling    HOME MEDICATIONS: Outpatient Medications Prior to Visit  Medication Sig Dispense Refill   amLODipine (NORVASC) 10 MG tablet Take by mouth.     aspirin EC 81 MG tablet Take 1 tablet by mouth daily.     atorvastatin (LIPITOR) 20 MG tablet Take 1 tablet by mouth daily. 90 tablet 1   busPIRone (BUSPAR) 5 MG tablet Take 1 tablet by mouth twice daily. 180 tablet 0   EPINEPHrine 0.3 mg/0.3 mL IJ SOAJ injection Inject 0.3 mLs (0.3 mg total) into the muscle as needed. For Anaphylaxis 1 Device 0   fexofenadine (ALLEGRA) 180 MG tablet Take 1 tablet by mouth daily.     hydrOXYzine (ATARAX) 10 MG tablet Take 1 tablet by mouth twice daily as needed. 180 tablet 0   nitroGLYCERIN (NITROSTAT) 0.4 MG SL tablet Dissolve 1 tablet under the tongue every 5 minutes as needed for chest pain. Maximum daily dose is 3 tablets. 25 tablet 0   pramipexole (MIRAPEX) 0.5 MG tablet Take 1 tablet by mouth at bedtime. 90 tablet 0   QUEtiapine (SEROQUEL) 50 MG tablet Take 1/2 to 1 tablet by mouth twice daily. 180 tablet 0   ranolazine (RANEXA) 500 MG 12 hr tablet Take 2 tablets by mouth daily. 180 tablet 0   Selenium Sulfide 2.25 % SHAM Apply 1 application topically daily as needed.  1 Bottle 3   topiramate (TOPAMAX) 100 MG tablet TAKE 1 TABLET(100 MG) BY MOUTH DAILY 90 tablet 1   venlafaxine XR (EFFEXOR-XR) 75 MG 24 hr capsule Take 3 capsules by mouth daily with breakfast. 270 capsule 0   carvedilol (COREG) 25 MG tablet Take 25 mg by mouth 2 (two) times daily.     albuterol (VENTOLIN HFA) 108 (90 Base) MCG/ACT inhaler Inhale 2 puffs into the lungs every 6 (six) hours as needed for wheezing or shortness of breath. 8 g 0   No facility-administered medications prior to visit.    PAST MEDICAL HISTORY: Past Medical History:  Diagnosis Date   Anxiety    Cardiac microvascular disease     Chicken pox    Depression    Thyroid mass    right side    PAST SURGICAL HISTORY: Past Surgical History:  Procedure Laterality Date   CESAREAN SECTION     CHOLECYSTECTOMY  2009   SEPTOPLASTY     THYROID SURGERY Right    thyroidectomy   TUBAL LIGATION      FAMILY HISTORY: Family History  Problem Relation Age of Onset   Stroke Father    Cancer Father        Kidney    AAA (abdominal aortic aneurysm) Father    Depression Sister    Anxiety disorder Sister    Cancer Paternal Uncle        Lung   Depression Maternal Grandmother    Heart disease Paternal Grandmother    Stroke Paternal Grandmother    Diabetes Paternal Grandmother    Diabetes Paternal Grandfather     SOCIAL HISTORY: Social History   Socioeconomic History   Marital status: Married    Spouse name: Not on file   Number of children: Not on file   Years of education: Not on file   Highest education level: Not on file  Occupational History   Not on file  Tobacco Use   Smoking status: Every Day    Packs/day: 0.00    Types: Cigarettes, E-cigarettes   Smokeless tobacco: Never  Vaping Use   Vaping Use: Some days   Substances: Nicotine, Flavoring  Substance and Sexual Activity   Alcohol use: Yes    Alcohol/week: 0.0 standard drinks of alcohol    Comment: occasional   Drug use: No   Sexual activity: Yes  Other Topics Concern   Not on file  Social History Narrative   Not on file   Social Determinants of Health   Financial Resource Strain: Not on file  Food Insecurity: Not on file  Transportation Needs: Not on file  Physical Activity: Not on file  Stress: Not on file  Social Connections: Not on file  Intimate Partner Violence: Not on file    PHYSICAL EXAM  GENERAL EXAM/CONSTITUTIONAL: Vitals:  Vitals:   07/20/22 1459  BP: 117/80  Pulse: 74  Weight: 226 lb (102.5 kg)  Height: 5\' 5"  (1.651 m)   Body mass index is 37.61 kg/m. Wt Readings from Last 3 Encounters:  07/20/22 226 lb (102.5  kg)  12/08/21 224 lb (101.6 kg)  08/26/21 222 lb (100.7 kg)   Patient is in no distress; well developed, nourished and groomed; neck is supple  EYES: Visual fields full to confrontation, Extraocular movements intacts,  No results found.  MUSCULOSKELETAL: Gait, strength, tone, movements noted in Neurologic exam below  NEUROLOGIC: MENTAL STATUS:      No data to display  awake, alert, oriented to person, place and time recent and remote memory intact normal attention and concentration language fluent, comprehension intact, naming intact fund of knowledge appropriate  CRANIAL NERVE:  2nd, 3rd, 4th, 6th - Visual fields full to confrontation, extraocular muscles intact, no nystagmus 5th - facial sensation symmetric 7th - facial strength symmetric 8th - hearing intact 9th - palate elevates symmetrically, uvula midline 11th - shoulder shrug symmetric 12th - tongue protrusion midline  MOTOR:  normal bulk and tone, full strength in the BUE, BLE  SENSORY:  normal and symmetric to light touch  COORDINATION:  finger-nose-finger, fine finger movements normal  REFLEXES:  deep tendon reflexes present and symmetric  GAIT/STATION:  normal   DIAGNOSTIC DATA (LABS, IMAGING, TESTING) - I reviewed patient records, labs, notes, testing and imaging myself where available.  Lab Results  Component Value Date   WBC 6.5 12/08/2021   HGB 12.9 12/08/2021   HCT 39.4 12/08/2021   MCV 102.1 (H) 12/08/2021   PLT 350 12/08/2021      Component Value Date/Time   NA 143 12/08/2021 1457   K 4.1 12/08/2021 1457   CL 109 12/08/2021 1457   CO2 26 12/08/2021 1457   GLUCOSE 70 12/08/2021 1457   BUN 16 12/08/2021 1457   CREATININE 1.30 (H) 12/08/2021 1457   CALCIUM 9.0 12/08/2021 1457   PROT 6.6 12/08/2021 1457   ALBUMIN 4.4 07/15/2020 1611   AST 18 12/08/2021 1457   ALT 11 12/08/2021 1457   ALKPHOS 53 07/15/2020 1611   BILITOT 0.3 12/08/2021 1457   Lab Results  Component  Value Date   CHOL 176 08/26/2021   HDL 89 08/26/2021   LDLCALC 72 08/26/2021   TRIG 74 08/26/2021   Lab Results  Component Value Date   HGBA1C 5.0 08/26/2021   Lab Results  Component Value Date   VITAMINB12 176 (L) 07/15/2020   Lab Results  Component Value Date   TSH 2.27 12/08/2021   MRI Brain 12/11/21 Unchanged and unremarkable appearance of the brain.   I personally reviewed brain Images.   ASSESSMENT AND PLAN  46 y.o. year old female  with history including hypertension, hyperlipidemia, anxiety/depression, insomnia who is presenting with events concerning for seizures.  Events described as period of unresponsiveness associated with confusion.  There were no reported abnormal movements, no tongue biting or urinary incontinence, no drooling.  Will plan first to get a routine EEG, if normal we will continue to observe patient but if she continued to have these events, we will obtain an ambulatory EEG.  This was discussed with patient and daughter and they are both comfortable with plans.  Continue to follow with PCP and return if sooner if worse.   1. Seizure-like activity [facility])     Patient Instructions  Routine EEG, will contact you to go over the results  Continue to follow up with PCP Return  if worse    Per [facility] statutes, patients with seizures are not allowed to drive until they have been seizure-free for six months.  Other recommendations include using caution when using heavy equipment or power tools. Avoid working on ladders or at heights. Take showers instead of baths.  Do not swim alone.  Ensure the water temperature is not too high on the home water heater. Do not go swimming alone. Do not lock yourself in a room alone (i.e. bathroom). When caring for infants or small children, sit down when holding, feeding, or changing them to minimize risk of  injury to the child in the event you have a seizure. Maintain good sleep hygiene. Avoid alcohol.  Also  recommend adequate sleep, hydration, good diet and minimize stress.   During the Seizure  - First, ensure adequate ventilation and place patients on the floor on their left side  Loosen clothing around the neck and ensure the airway is patent. If the patient is clenching the teeth, do not force the mouth open with any object as this can cause severe damage - Remove all items from the surrounding that can be hazardous. The patient may be oblivious to what's happening and may not even know what he or she is doing. If the patient is confused and wandering, either gently guide him/her away and block access to outside areas - Reassure the individual and be comforting - Call 911. In most cases, the seizure ends before EMS arrives. However, there are cases when seizures may last over 3 to 5 minutes. Or the individual may have developed breathing difficulties or severe injuries. If a pregnant patient or a person with diabetes develops a seizure, it is prudent to call an ambulance. - Finally, if the patient does not regain full consciousness, then call EMS. Most patients will remain confused for about 45 to 90 minutes after a seizure, so you must use judgment in calling for help. - Avoid restraints but make sure the patient is in a bed with padded side rails - Place the individual in a lateral position with the neck slightly flexed; this will help the saliva drain from the mouth and prevent the tongue from falling backward - Remove all nearby furniture and other hazards from the area - Provide verbal assurance as the individual is regaining consciousness - Provide the patient with privacy if possible - Call for help and start treatment as ordered by the caregiver   After the Seizure (Postictal Stage)  After a seizure, most patients experience confusion, fatigue, muscle pain and/or a headache. Thus, one should permit the individual to sleep. For the next few days, reassurance is essential. Being calm and  helping reorient the person is also of importance.  Most seizures are painless and end spontaneously. Seizures are not harmful to others but can lead to complications such as stress on the lungs, brain and the heart. Individuals with prior lung problems may develop labored breathing and respiratory distress.     Orders Placed This Encounter  Procedures   EEG adult    No orders of the defined types were placed in this encounter.   Return if symptoms worsen or fail to improve.    Windell NorfolkAmadou Laurrie Toppin, MD 07/21/2022, 3:40 PM  [facility] [address], [address] [place], [place] [address] [phone]

## 2022-07-20 NOTE — Patient Instructions (Addendum)
Routine EEG, will contact you to go over the results  Continue to follow up with PCP Return  if worse

## 2022-07-26 ENCOUNTER — Telehealth: Payer: Self-pay | Admitting: Internal Medicine

## 2022-07-26 NOTE — Telephone Encounter (Signed)
Left message for patient to call back and schedule the Medicare Annual Wellness Visit (AWV) virtually or by telephone.  Last AWV 07/15/20  Please schedule at anytime with [facility] Advisor.   Any questions, please call me at [phone]

## 2022-08-22 ENCOUNTER — Other Ambulatory Visit: Payer: Medicare (Managed Care) | Admitting: *Deleted

## 2022-08-30 ENCOUNTER — Other Ambulatory Visit: Payer: Self-pay | Admitting: Internal Medicine

## 2022-08-31 NOTE — Telephone Encounter (Signed)
Requested medication (s) are due for refill today: yes  Requested medication (s) are on the active medication list: yes  Last refill:  09/20/21 #180  Future visit scheduled: no  Notes to clinic:  med not delegated to NT to RF   Requested Prescriptions  Pending Prescriptions Disp Refills   ranolazine (RANEXA) 500 MG 12 hr tablet [Pharmacy Med Name: Ranolazine 500mg  Extended-Release Tablet] 180 tablet 0    Sig: Take 2 tablets by mouth daily.     Not Delegated - Cardiovascular: Ranolazine Failed - 08/30/2022  7:53 PM      Failed - This refill cannot be delegated      Failed - Cr in normal range and within 360 days    Creat  Date Value Ref Range Status  12/08/2021 1.30 (H) 0.50 - 0.99 mg/dL Final         Passed - K in normal range and within 360 days    Potassium  Date Value Ref Range Status  12/08/2021 4.1 3.5 - 5.3 mmol/L Final         Passed - Last BP in normal range    BP Readings from Last 1 Encounters:  07/20/22 117/80         Passed - Valid encounter within last 12 months    Recent Outpatient Visits           3 months ago Cardiac microvascular disease   [facility] [place] [facility] [place], Coralie Keens, NP   8 months ago Seizure-like activity [facility])   [facility] [place], Coralie Keens, NP   1 year ago Encounter for general adult medical examination with abnormal findings   [facility], Coralie Keens, NP   1 year ago [facility] [place], Coralie Keens, [place]

## 2022-09-04 ENCOUNTER — Other Ambulatory Visit: Payer: Self-pay | Admitting: Internal Medicine

## 2022-09-05 NOTE — Telephone Encounter (Signed)
Requested medications are due for refill today.  yes  Requested medications are on the active medications list.  yes  Last refill. 06/05/2022 #180 0 rf  Future visit scheduled.   no  Notes to clinic.  Refill not delegated.    Requested Prescriptions  Pending Prescriptions Disp Refills   QUEtiapine (SEROQUEL) 50 MG tablet [Pharmacy Med Name: Quetiapine Fumarate 50mg  Tablet] 180 tablet 0    Sig: Take 1/2 to 1 tablet by mouth twice daily.     Not Delegated - Psychiatry:  Antipsychotics - Second Generation (Atypical) - quetiapine Failed - 09/04/2022  1:41 PM      Failed - This refill cannot be delegated      Failed - Lipid Panel in normal range within the last 12 months    Cholesterol  Date Value Ref Range Status  08/26/2021 176 <200 mg/dL Final   LDL Cholesterol (Calc)  Date Value Ref Range Status  08/26/2021 72 mg/dL (calc) Final    Comment:    Reference range: <100 . Desirable range <100 mg/dL for primary prevention;   <70 mg/dL for patients with CHD or diabetic patients  with > or = 2 CHD risk factors. Marland Kitchen LDL-C is now calculated using the Martin-Hopkins  calculation, which is a validated novel method providing  better accuracy than the Friedewald equation in the  estimation of LDL-C.  Cresenciano Genre et al. Annamaria Helling. 6712;458(09): 2061-2068  (http://education.[facility].com/faq/FAQ164)    HDL  Date Value Ref Range Status  08/26/2021 89 > OR = 50 mg/dL Final   Triglycerides  Date Value Ref Range Status  08/26/2021 74 <150 mg/dL Final         Failed - CBC within normal limits and completed in the last 12 months    WBC  Date Value Ref Range Status  12/08/2021 6.5 3.8 - 10.8 Thousand/uL Final   RBC  Date Value Ref Range Status  12/08/2021 3.86 3.80 - 5.10 Million/uL Final   Hemoglobin  Date Value Ref Range Status  12/08/2021 12.9 11.7 - 15.5 g/dL Final   HCT  Date Value Ref Range Status  12/08/2021 39.4 35.0 - 45.0 % Final   MCHC  Date Value Ref Range  Status  12/08/2021 32.7 32.0 - 36.0 g/dL Final   [facility]  Date Value Ref Range Status  12/08/2021 33.4 (H) 27.0 - 33.0 pg Final   MCV  Date Value Ref Range Status  12/08/2021 102.1 (H) 80.0 - 100.0 fL Final   No results found for: "PLTCOUNTKUC", "LABPLAT", "POCPLA" RDW  Date Value Ref Range Status  12/08/2021 12.8 11.0 - 15.0 % Final         Failed - CMP within normal limits and completed in the last 12 months    Albumin  Date Value Ref Range Status  07/15/2020 4.4 3.5 - 5.2 g/dL Final   Alkaline Phosphatase  Date Value Ref Range Status  07/15/2020 53 39 - 117 U/L Final   Alkaline phosphatase (APISO)  Date Value Ref Range Status  12/08/2021 63 31 - 125 U/L Final   ALT  Date Value Ref Range Status  12/08/2021 11 6 - 29 U/L Final   AST  Date Value Ref Range Status  12/08/2021 18 10 - 35 U/L Final   BUN  Date Value Ref Range Status  12/08/2021 16 7 - 25 mg/dL Final   Calcium  Date Value Ref Range Status  12/08/2021 9.0 8.6 - 10.2 mg/dL Final   CO2  Date Value Ref Range Status  12/08/2021 26 20 - 32 mmol/L Final   Creat  Date Value Ref Range Status  12/08/2021 1.30 (H) 0.50 - 0.99 mg/dL Final   Glucose, Bld  Date Value Ref Range Status  12/08/2021 70 65 - 139 mg/dL Final    Comment:    .        Non-fasting reference interval .    Potassium  Date Value Ref Range Status  12/08/2021 4.1 3.5 - 5.3 mmol/L Final   Sodium  Date Value Ref Range Status  12/08/2021 143 135 - 146 mmol/L Final   Total Bilirubin  Date Value Ref Range Status  12/08/2021 0.3 0.2 - 1.2 mg/dL Final   Protein, UA  Date Value Ref Range Status  11/23/2017 neg  Final   Total Protein  Date Value Ref Range Status  12/08/2021 6.6 6.1 - 8.1 g/dL Final   GFR  Date Value Ref Range Status  07/15/2020 49.68 (L) >60.00 mL/min Final    Comment:    Calculated using the CKD-EPI Creatinine Equation (2021)   eGFR  Date Value Ref Range Status  12/08/2021 52 (L) > OR = 60 mL/min/1.57m2  Final    Comment:    The eGFR is based on the CKD-EPI 2021 equation. To calculate  the new eGFR from a previous Creatinine or Cystatin C result, go to https://www.kidney.org/professionals/ kdoqi/gfr%5Fcalculator          Passed - TSH in normal range and within 360 days    TSH  Date Value Ref Range Status  12/08/2021 2.27 mIU/L Final    Comment:              Reference Range .           > or = 20 Years  0.40-4.50 .                Pregnancy Ranges           First trimester    0.26-2.66           Second trimester   0.55-2.73           Third trimester    0.43-2.91          Passed - Completed PHQ-2 or PHQ-9 in the last 360 days      Passed - Last BP in normal range    BP Readings from Last 1 Encounters:  07/20/22 117/80         Passed - Last Heart Rate in normal range    Pulse Readings from Last 1 Encounters:  07/20/22 74         Passed - Valid encounter within last 6 months    Recent Outpatient Visits           3 months ago Cardiac microvascular disease   [facility] [place], Coralie Keens, NP   9 months ago Seizure-like activity [facility])   [facility] [place], Coralie Keens, NP   1 year ago Encounter for general adult medical examination with abnormal findings   [facility] [place], Coralie Keens, NP   1 year ago [facility] [place], Coralie Keens, [place]

## 2022-09-12 ENCOUNTER — Other Ambulatory Visit: Payer: Self-pay | Admitting: Internal Medicine

## 2022-09-12 DIAGNOSIS — G43819 Other migraine, intractable, without status migrainosus: Secondary | ICD-10-CM

## 2022-09-13 NOTE — Telephone Encounter (Signed)
Requested Prescriptions  Pending Prescriptions Disp Refills   topiramate (TOPAMAX) 100 MG tablet [Pharmacy Med Name: Topiramate 100mg  Tablet] 90 tablet 1    Sig: Take 1 tablet by mouth daily.     Neurology: Anticonvulsants - topiramate & zonisamide Failed - 09/12/2022  3:29 PM      Failed - Cr in normal range and within 360 days    Creat  Date Value Ref Range Status  12/08/2021 1.30 (H) 0.50 - 0.99 mg/dL Final         Passed - CO2 in normal range and within 360 days    CO2  Date Value Ref Range Status  12/08/2021 26 20 - 32 mmol/L Final         Passed - ALT in normal range and within 360 days    ALT  Date Value Ref Range Status  12/08/2021 11 6 - 29 U/L Final         Passed - AST in normal range and within 360 days    AST  Date Value Ref Range Status  12/08/2021 18 10 - 35 U/L Final         Passed - Completed PHQ-2 or PHQ-9 in the last 360 days      Passed - Valid encounter within last 12 months    Recent Outpatient Visits           4 months ago Cardiac microvascular disease   [facility] [place], Coralie Keens, NP   9 months ago Seizure-like activity [facility])   [facility] [place], Coralie Keens, NP   1 year ago Encounter for general adult medical examination with abnormal findings   [facility] [place], Coralie Keens, NP   1 year ago [facility] [place], Coralie Keens, [place]

## 2022-10-12 ENCOUNTER — Encounter: Payer: Self-pay | Admitting: Internal Medicine

## 2022-10-12 ENCOUNTER — Telehealth: Payer: BC Managed Care – PPO | Admitting: Internal Medicine

## 2022-10-31 ENCOUNTER — Other Ambulatory Visit: Payer: Self-pay | Admitting: Internal Medicine

## 2022-11-01 NOTE — Telephone Encounter (Signed)
Requested Prescriptions  Pending Prescriptions Disp Refills   atorvastatin (LIPITOR) 20 MG tablet [Pharmacy Med Name: Atorvastatin Calcium 20mg  Tablet] 90 tablet 0    Sig: Take 1 tablet by mouth daily.     Cardiovascular:  Antilipid - Statins Failed - 10/31/2022 11:38 PM      Failed - Lipid Panel in normal range within the last 12 months    Cholesterol  Date Value Ref Range Status  08/26/2021 176 <200 mg/dL Final   LDL Cholesterol (Calc)  Date Value Ref Range Status  08/26/2021 72 mg/dL (calc) Final    Comment:    Reference range: <100 . Desirable range <100 mg/dL for primary prevention;   <70 mg/dL for patients with CHD or diabetic patients  with > or = 2 CHD risk factors. Marland Kitchen LDL-C is now calculated using the Martin-Hopkins  calculation, which is a validated novel method providing  better accuracy than the Friedewald equation in the  estimation of LDL-C.  Cresenciano Genre et al. Annamaria Helling. [id number]): 2061-2068  (http://education.[facility].com/faq/FAQ164)    HDL  Date Value Ref Range Status  08/26/2021 89 > OR = 50 mg/dL Final   Triglycerides  Date Value Ref Range Status  08/26/2021 74 <150 mg/dL Final         Passed - Patient is not pregnant      Passed - Valid encounter within last 12 months    Recent Outpatient Visits           5 months ago Cardiac microvascular disease   [facility] [place], Coralie Keens, NP   10 months ago Seizure-like activity [facility])   [facility] [place], Coralie Keens, NP   1 year ago Encounter for general adult medical examination with abnormal findings   [facility] [place], Coralie Keens, NP   1 year ago [facility] [place], Coralie Keens, [place]

## 2022-11-14 ENCOUNTER — Other Ambulatory Visit: Payer: Self-pay | Admitting: Internal Medicine

## 2022-11-14 DIAGNOSIS — F419 Anxiety disorder, unspecified: Secondary | ICD-10-CM

## 2022-11-14 DIAGNOSIS — R252 Cramp and spasm: Secondary | ICD-10-CM

## 2022-11-14 NOTE — Telephone Encounter (Signed)
Requested medication (s) are due for refill today: yes  Requested medication (s) are on the active medication list: yes  Last refill:  multiple dates  Future visit scheduled: no  Notes to clinic:  Unable to refill per protocol, cannot delegate.  OV needed     Requested Prescriptions  Pending Prescriptions Disp Refills   venlafaxine XR (EFFEXOR-XR) 75 MG 24 hr capsule [Pharmacy Med Name: Venlafaxine Hydrochloride 75mg  Extended-Release Capsule] 270 capsule 0    Sig: Take 3 capsules by mouth daily with breakfast.     Psychiatry: Antidepressants - SNRI - desvenlafaxine & venlafaxine Failed - 11/14/2022  1:25 PM      Failed - Cr in normal range and within 360 days    Creat  Date Value Ref Range Status  12/08/2021 1.30 (H) 0.50 - 0.99 mg/dL Final         Failed - Valid encounter within last 6 months    Recent Outpatient Visits           6 months ago Cardiac microvascular disease   [facility] [place], Coralie Keens, NP   11 months ago Seizure-like activity [facility])   [facility] [place], Coralie Keens, NP   1 year ago Encounter for general adult medical examination with abnormal findings   [facility] [place], Coralie Keens, NP   1 year ago [facility] [place], [place] W, NP              Failed - Lipid Panel in normal range within the last 12 months    Cholesterol  Date Value Ref Range Status  08/26/2021 176 <200 mg/dL Final   LDL Cholesterol (Calc)  Date Value Ref Range Status  08/26/2021 72 mg/dL (calc) Final    Comment:    Reference range: <100 . Desirable range <100 mg/dL for primary prevention;   <70 mg/dL for patients with CHD or diabetic patients  with > or = 2 CHD risk factors. Marland Kitchen LDL-C is now calculated using the Martin-Hopkins  calculation, which is a validated novel method providing  better accuracy than the Friedewald equation in the  estimation of LDL-C.   Cresenciano Genre et al. Annamaria Helling. [id number]): 2061-2068  (http://education.[facility].com/faq/FAQ164)    HDL  Date Value Ref Range Status  08/26/2021 89 > OR = 50 mg/dL Final   Triglycerides  Date Value Ref Range Status  08/26/2021 74 <150 mg/dL Final         Passed - Completed PHQ-2 or PHQ-9 in the last 360 days      Passed - Last BP in normal range    BP Readings from Last 1 Encounters:  07/20/22 117/80          QUEtiapine (SEROQUEL) 50 MG tablet [Pharmacy Med Name: Quetiapine Fumarate 50mg  Tablet] 180 tablet 0    Sig: Take 1/2 to 1 tablet by mouth twice daily.     Not Delegated - Psychiatry:  Antipsychotics - Second Generation (Atypical) - quetiapine Failed - 11/14/2022  1:25 PM      Failed - This refill cannot be delegated      Failed - Valid encounter within last 6 months    Recent Outpatient Visits           6 months ago Cardiac microvascular disease   [facility] [place], Coralie Keens, NP   11 months ago Seizure-like activity [facility])   [place]  [facility] [place], Coralie Keens, NP   1 year ago Encounter for general adult medical examination with abnormal findings   [facility] [place], Coralie Keens, NP   1 year ago [facility] [place], [place] W, NP              Failed - Lipid Panel in normal range within the last 12 months    Cholesterol  Date Value Ref Range Status  08/26/2021 176 <200 mg/dL Final   LDL Cholesterol (Calc)  Date Value Ref Range Status  08/26/2021 72 mg/dL (calc) Final    Comment:    Reference range: <100 . Desirable range <100 mg/dL for primary prevention;   <70 mg/dL for patients with CHD or diabetic patients  with > or = 2 CHD risk factors. Marland Kitchen LDL-C is now calculated using the Martin-Hopkins  calculation, which is a validated novel method providing  better accuracy than the Friedewald equation in the  estimation of LDL-C.  Cresenciano Genre et  al. Annamaria Helling. [id number]): 2061-2068  (http://education.[facility].com/faq/FAQ164)    HDL  Date Value Ref Range Status  08/26/2021 89 > OR = 50 mg/dL Final   Triglycerides  Date Value Ref Range Status  08/26/2021 74 <150 mg/dL Final         Failed - CBC within normal limits and completed in the last 12 months    WBC  Date Value Ref Range Status  12/08/2021 6.5 3.8 - 10.8 Thousand/uL Final   RBC  Date Value Ref Range Status  12/08/2021 3.86 3.80 - 5.10 Million/uL Final   Hemoglobin  Date Value Ref Range Status  12/08/2021 12.9 11.7 - 15.5 g/dL Final   HCT  Date Value Ref Range Status  12/08/2021 39.4 35.0 - 45.0 % Final   MCHC  Date Value Ref Range Status  12/08/2021 32.7 32.0 - 36.0 g/dL Final   [facility]  Date Value Ref Range Status  12/08/2021 33.4 (H) 27.0 - 33.0 pg Final   MCV  Date Value Ref Range Status  12/08/2021 102.1 (H) 80.0 - 100.0 fL Final   No results found for: "PLTCOUNTKUC", "LABPLAT", "POCPLA" RDW  Date Value Ref Range Status  12/08/2021 12.8 11.0 - 15.0 % Final         Failed - CMP within normal limits and completed in the last 12 months    Albumin  Date Value Ref Range Status  07/15/2020 4.4 3.5 - 5.2 g/dL Final   Alkaline Phosphatase  Date Value Ref Range Status  07/15/2020 53 39 - 117 U/L Final   Alkaline phosphatase (APISO)  Date Value Ref Range Status  12/08/2021 63 31 - 125 U/L Final   ALT  Date Value Ref Range Status  12/08/2021 11 6 - 29 U/L Final   AST  Date Value Ref Range Status  12/08/2021 18 10 - 35 U/L Final   BUN  Date Value Ref Range Status  12/08/2021 16 7 - 25 mg/dL Final   Calcium  Date Value Ref Range Status  12/08/2021 9.0 8.6 - 10.2 mg/dL Final   CO2  Date Value Ref Range Status  12/08/2021 26 20 - 32 mmol/L Final   Creat  Date Value Ref Range Status  12/08/2021 1.30 (H) 0.50 - 0.99 mg/dL Final   Glucose, Bld  Date Value Ref Range Status  12/08/2021 70 65 - 139 mg/dL Final    Comment:     .  Non-fasting reference interval .    Potassium  Date Value Ref Range Status  12/08/2021 4.1 3.5 - 5.3 mmol/L Final   Sodium  Date Value Ref Range Status  12/08/2021 143 135 - 146 mmol/L Final   Total Bilirubin  Date Value Ref Range Status  12/08/2021 0.3 0.2 - 1.2 mg/dL Final   Protein, UA  Date Value Ref Range Status  11/23/2017 neg  Final   Total Protein  Date Value Ref Range Status  12/08/2021 6.6 6.1 - 8.1 g/dL Final   GFR  Date Value Ref Range Status  07/15/2020 49.68 (L) >60.00 mL/min Final    Comment:    Calculated using the CKD-EPI Creatinine Equation (2021)   eGFR  Date Value Ref Range Status  12/08/2021 52 (L) > OR = 60 mL/min/1.22m2 Final    Comment:    The eGFR is based on the CKD-EPI 2021 equation. To calculate  the new eGFR from a previous Creatinine or Cystatin C result, go to https://www.kidney.org/professionals/ kdoqi/gfr%5Fcalculator          Passed - TSH in normal range and within 360 days    TSH  Date Value Ref Range Status  12/08/2021 2.27 mIU/L Final    Comment:              Reference Range .           > or = 20 Years  0.40-4.50 .                Pregnancy Ranges           First trimester    0.26-2.66           Second trimester   0.55-2.73           Third trimester    0.43-2.91          Passed - Completed PHQ-2 or PHQ-9 in the last 360 days      Passed - Last BP in normal range    BP Readings from Last 1 Encounters:  07/20/22 117/80         Passed - Last Heart Rate in normal range    Pulse Readings from Last 1 Encounters:  07/20/22 74          pramipexole (MIRAPEX) 0.5 MG tablet [Pharmacy Med Name: Pramipexole Dihydrochloride 0.5mg  Tablet] 90 tablet 0    Sig: Take 1 tablet by mouth at bedtime.     Neurology:  Parkinsonian Agents Passed - 11/14/2022  1:25 PM      Passed - Last BP in normal range    BP Readings from Last 1 Encounters:  07/20/22 117/80         Passed - Last Heart Rate in normal range    Pulse  Readings from Last 1 Encounters:  07/20/22 74         Passed - Valid encounter within last 12 months    Recent Outpatient Visits           6 months ago Cardiac microvascular disease   [facility] [place], Coralie Keens, NP   11 months ago Seizure-like activity [facility])   [facility] [place], Cor[facility] Keens, NP   1 year ago Encounter for general adult medical examination with abnormal findings   [facility] [place], Coralie Keens, NP   1 year ago [facility] [place], Coralie Keens, [place]  busPIRone (BUSPAR) 5 MG tablet [Pharmacy Med Name: Buspirone Hydrochloride 5mg  Tablet] 180 tablet 0    Sig: Take 1 tablet by mouth twice daily.     Psychiatry: Anxiolytics/Hypnotics - Non-controlled Passed - 11/14/2022  1:25 PM      Passed - Valid encounter within last 12 months    Recent Outpatient Visits           6 months ago Cardiac microvascular disease   [facility] [place], Coralie Keens, NP   11 months ago Seizure-like activity [facility])   [facility] [place], Coralie Keens, NP   1 year ago Encounter for general adult medical examination with abnormal findings   [facility] [place], Coralie Keens, NP   1 year ago [facility] [place], [place] W, NP               ranolazine (RANEXA) 500 MG 12 hr tablet [Pharmacy Med Name: Ranolazine 500mg  Extended-Release Tablet] 180 tablet 0    Sig: Take 2 tablets by mouth daily.     Not Delegated - Cardiovascular: Ranolazine Failed - 11/14/2022  1:25 PM      Failed - This refill cannot be delegated      Failed - Cr in normal range and within 360 days    Creat  Date Value Ref Range Status  12/08/2021 1.30 (H) 0.50 - 0.99 mg/dL Final         Passed - K in normal range and within 360 days    Potassium  Date Value Ref Range Status  12/08/2021  4.1 3.5 - 5.3 mmol/L Final         Passed - Last BP in normal range    BP Readings from Last 1 Encounters:  07/20/22 117/80         Passed - Valid encounter within last 12 months    Recent Outpatient Visits           6 months ago Cardiac microvascular disease   [facility] [place], Coralie Keens, NP   11 months ago Seizure-like activity [facility])   [facility] [place], Coralie Keens, NP   1 year ago Encounter for general adult medical examination with abnormal findings   [facility] [place], Coralie Keens, NP   1 year ago [facility] [place], Coralie Keens, NP               hydrOXYzine (ATARAX) 10 MG tablet [Pharmacy Med Name: Hydroxyzine Hydrochloride 10mg  Tablet] 180 tablet 0    Sig: Take 1 tablet by mouth twice daily as needed.     Ear, Nose, and Throat:  Antihistamines 2 Failed - 11/14/2022  1:25 PM      Failed - Cr in normal range and within 360 days    Creat  Date Value Ref Range Status  12/08/2021 1.30 (H) 0.50 - 0.99 mg/dL Final         Passed - Valid encounter within last 12 months    Recent Outpatient Visits           6 months ago Cardiac microvascular disease   [facility] [place], Coralie Keens, NP   11 months ago Seizure-[facility])   [facility] [place], Coralie Keens, NP   1 year ago Encounter  for general adult medical examination with abnormal findings   [facility] [place], Coralie Keens, NP   1 year ago [facility] [place], Coralie Keens, [place]

## 2023-01-12 ENCOUNTER — Other Ambulatory Visit: Payer: Self-pay | Admitting: Internal Medicine

## 2023-01-12 DIAGNOSIS — R252 Cramp and spasm: Secondary | ICD-10-CM

## 2023-01-12 DIAGNOSIS — F419 Anxiety disorder, unspecified: Secondary | ICD-10-CM

## 2023-01-12 NOTE — Telephone Encounter (Signed)
Requested Prescriptions  Pending Prescriptions Disp Refills   ranolazine (RANEXA) 500 MG 12 hr tablet [Pharmacy Med Name: Ranolazine 500mg  Extended-Release Tablet] 180 tablet 0    Sig: Take 2 tablets by mouth daily.     Not Delegated - Cardiovascular: Ranolazine Failed - 01/12/2023  1:22 PM      Failed - This refill cannot be delegated      Failed - K in normal range and within 360 days    Potassium  Date Value Ref Range Status  12/08/2021 4.1 3.5 - 5.3 mmol/L Final         Failed - Cr in normal range and within 360 days    Creat  Date Value Ref Range Status  12/08/2021 1.30 (H) 0.50 - 0.99 mg/dL Final         Passed - Last BP in normal range    BP Readings from Last 1 Encounters:  07/20/22 117/80         Passed - Valid encounter within last 12 months    Recent Outpatient Visits           8 months ago Cardiac microvascular disease   [facility] [place], Salvadore Oxford, NP   1 year ago Seizure-like activity [facility])   [place] [facility] [place], Salvadore Oxford, NP   1 year ago Encounter for general adult medical examination with abnormal findings   [facility] [place], Salvadore Oxford, NP   1 year ago COVID-19   [facility] [place], Salvadore Oxford, NP               QUEtiapine (SEROQUEL) 50 MG tablet [Pharmacy Med Name: Quetiapine Fumarate 50mg  Tablet] 180 tablet 0    Sig: Take 1/2 to 1 tablet by mouth twice daily.     Not Delegated - Psychiatry:  Antipsychotics - Second Generation (Atypical) - quetiapine Failed - 01/12/2023  1:22 PM      Failed - This refill cannot be delegated      Failed - TSH in normal range and within 360 days    TSH  Date Value Ref Range Status  12/08/2021 2.27 mIU/L Final    Comment:              Reference Range .           > or = 20 Years  0.40-4.50 .                Pregnancy Ranges           First trimester    0.26-2.66           Second trimester   0.55-2.73            Third trimester    0.43-2.91          Failed - Completed PHQ-2 or PHQ-9 in the last 360 days      Failed - Valid encounter within last 6 months    Recent Outpatient Visits           8 months ago Cardiac microvascular disease   [facility] [place], Salvadore Oxford, NP   1 year ago Seizure-like activity [facility])   [facility] [place], Salvadore Oxford, NP   1 year ago Encounter for general adult medical examination with abnormal findings   [place] [facility] [place], Salvadore Oxford, NP   1 year  ago COVID-19   [facility] [place], [place] W, NP              Failed - Lipid Panel in normal range within the last 12 months    Cholesterol  Date Value Ref Range Status  08/26/2021 176 <200 mg/dL Final   LDL Cholesterol (Calc)  Date Value Ref Range Status  08/26/2021 72 mg/dL (calc) Final    Comment:    Reference range: <100 . Desirable range <100 mg/dL for primary prevention;   <70 mg/dL for patients with CHD or diabetic patients  with > or = 2 CHD risk factors. Marland Kitchen LDL-C is now calculated using the Martin-Hopkins  calculation, which is a validated novel method providing  better accuracy than the Friedewald equation in the  estimation of LDL-C.  Horald Pollen et al. Lenox Ahr. 4098;119(14): 2061-2068  (http://education.[facility].com/faq/FAQ164)    HDL  Date Value Ref Range Status  08/26/2021 89 > OR = 50 mg/dL Final   Triglycerides  Date Value Ref Range Status  08/26/2021 74 <150 mg/dL Final         Failed - CBC within normal limits and completed in the last 12 months    WBC  Date Value Ref Range Status  12/08/2021 6.5 3.8 - 10.8 Thousand/uL Final   RBC  Date Value Ref Range Status  12/08/2021 3.86 3.80 - 5.10 Million/uL Final   Hemoglobin  Date Value Ref Range Status  12/08/2021 12.9 11.7 - 15.5 g/dL Final   HCT  Date Value Ref Range Status  12/08/2021 39.4 35.0 - 45.0 % Final    MCHC  Date Value Ref Range Status  12/08/2021 32.7 32.0 - 36.0 g/dL Final   [facility]  Date Value Ref Range Status  12/08/2021 33.4 (H) 27.0 - 33.0 pg Final   MCV  Date Value Ref Range Status  12/08/2021 102.1 (H) 80.0 - 100.0 fL Final   No results found for: "PLTCOUNTKUC", "LABPLAT", "POCPLA" RDW  Date Value Ref Range Status  12/08/2021 12.8 11.0 - 15.0 % Final         Failed - CMP within normal limits and completed in the last 12 months    Albumin  Date Value Ref Range Status  07/15/2020 4.4 3.5 - 5.2 g/dL Final   Alkaline Phosphatase  Date Value Ref Range Status  07/15/2020 53 39 - 117 U/L Final   Alkaline phosphatase (APISO)  Date Value Ref Range Status  12/08/2021 63 31 - 125 U/L Final   ALT  Date Value Ref Range Status  12/08/2021 11 6 - 29 U/L Final   AST  Date Value Ref Range Status  12/08/2021 18 10 - 35 U/L Final   BUN  Date Value Ref Range Status  12/08/2021 16 7 - 25 mg/dL Final   Calcium  Date Value Ref Range Status  12/08/2021 9.0 8.6 - 10.2 mg/dL Final   CO2  Date Value Ref Range Status  12/08/2021 26 20 - 32 mmol/L Final   Creat  Date Value Ref Range Status  12/08/2021 1.30 (H) 0.50 - 0.99 mg/dL Final   Glucose, Bld  Date Value Ref Range Status  12/08/2021 70 65 - 139 mg/dL Final    Comment:    .        Non-fasting reference interval .    Potassium  Date Value Ref Range Status  12/08/2021 4.1 3.5 - 5.3 mmol/L Final   Sodium  Date Value Ref Range Status  12/08/2021 143 135 -  146 mmol/L Final   Total Bilirubin  Date Value Ref Range Status  12/08/2021 0.3 0.2 - 1.2 mg/dL Final   Protein, UA  Date Value Ref Range Status  11/23/2017 neg  Final   Total Protein  Date Value Ref Range Status  12/08/2021 6.6 6.1 - 8.1 g/dL Final   GFR  Date Value Ref Range Status  07/15/2020 49.68 (L) >60.00 mL/min Final    Comment:    Calculated using the CKD-EPI Creatinine Equation (2021)   eGFR  Date Value Ref Range Status  12/08/2021  52 (L) > OR = 60 mL/min/1.61m2 Final    Comment:    The eGFR is based on the CKD-EPI 2021 equation. To calculate  the new eGFR from a previous Creatinine or Cystatin C result, go to https://www.kidney.org/professionals/ kdoqi/gfr%5Fcalculator          Passed - Last BP in normal range    BP Readings from Last 1 Encounters:  07/20/22 117/80         Passed - Last Heart Rate in normal range    Pulse Readings from Last 1 Encounters:  07/20/22 74          pramipexole (MIRAPEX) 0.5 MG tablet [Pharmacy Med Name: Pramipexole Dihydrochloride 0.5mg  Tablet] 90 tablet 0    Sig: Take 1 tablet by mouth at bedtime.     Neurology:  Parkinsonian Agents Passed - 01/12/2023  1:22 PM      Passed - Last BP in normal range    BP Readings from Last 1 Encounters:  07/20/22 117/80         Passed - Last Heart Rate in normal range    Pulse Readings from Last 1 Encounters:  07/20/22 74         Passed - Valid encounter within last 12 months    Recent Outpatient Visits           8 months ago Cardiac microvascular disease   [place] [facility] [place], Salvadore Oxford, NP   1 year ago Seizure-like activity [facility])   [facility] [place], Salvadore Oxford, NP   1 year ago Encounter for general adult medical examination with abnormal findings   [facility] [place], Salvadore Oxford, NP   1 year ago COVID-19   [facility] [place], [place], NP               venlafaxine XR (EFFEXOR-XR) 75 MG 24 hr capsule [Pharmacy Med Name: Venlafaxine Hydrochloride 75mg  Extended-Release Capsule] 270 capsule 0    Sig: Take 3 capsules by mouth daily with breakfast.     Psychiatry: Antidepressants - SNRI - desvenlafaxine & venlafaxine Failed - 01/12/2023  1:22 PM      Failed - Cr in normal range and within 360 days    Creat  Date Value Ref Range Status  12/08/2021 1.30 (H) 0.50 - 0.99 mg/dL Final         Failed - Completed PHQ-2 or PHQ-9  in the last 360 days      Failed - Valid encounter within last 6 months    Recent Outpatient Visits           8 months ago Cardiac microvascular disease   [place] [facility] [place], Salvadore Oxford, NP   1 year ago Seizure-like activity [facility])    [facility] [place], Salvadore Oxford, NP   1 year ago Encounter for  general adult medical examination with abnormal findings   [facility] [place], Salvadore Oxford, NP   1 year ago COVID-19   [facility] [place], [place] W, NP              Failed - Lipid Panel in normal range within the last 12 months    Cholesterol  Date Value Ref Range Status  08/26/2021 176 <200 mg/dL Final   LDL Cholesterol (Calc)  Date Value Ref Range Status  08/26/2021 72 mg/dL (calc) Final    Comment:    Reference range: <100 . Desirable range <100 mg/dL for primary prevention;   <70 mg/dL for patients with CHD or diabetic patients  with > or = 2 CHD risk factors. Marland Kitchen LDL-C is now calculated using the Martin-Hopkins  calculation, which is a validated novel method providing  better accuracy than the Friedewald equation in the  estimation of LDL-C.  Horald Pollen et al. Lenox Ahr. 6962;952(84): 2061-2068  (http://education.[facility].com/faq/FAQ164)    HDL  Date Value Ref Range Status  08/26/2021 89 > OR = 50 mg/dL Final   Triglycerides  Date Value Ref Range Status  08/26/2021 74 <150 mg/dL Final         Passed - Last BP in normal range    BP Readings from Last 1 Encounters:  07/20/22 117/80

## 2023-01-12 NOTE — Telephone Encounter (Signed)
Requested Prescriptions  Pending Prescriptions Disp Refills   busPIRone (BUSPAR) 5 MG tablet [Pharmacy Med Name: Buspirone Hydrochloride 5mg  Tablet] 180 tablet 0    Sig: Take 1 tablet by mouth twice daily.     Psychiatry: Anxiolytics/Hypnotics - Non-controlled Passed - 01/12/2023  2:52 PM      Passed - Valid encounter within last 12 months    Recent Outpatient Visits           8 months ago Cardiac microvascular disease   [place] [facility] [place], Salvadore Oxford, NP   1 year ago Seizure-like activity [facility])   [facility] [place], Salvadore Oxford, NP   1 year ago Encounter for general adult medical examination with abnormal findings   [facility] [place], Salvadore Oxford, NP   1 year ago COVID-19   [facility] [place], Salvadore Oxford, NP               hydrOXYzine (ATARAX) 10 MG tablet [Pharmacy Med Name: Hydroxyzine Hydrochloride 10mg  Tablet] 180 tablet 0    Sig: Take 1 tablet by mouth twice daily as needed.     Ear, Nose, and Throat:  Antihistamines 2 Failed - 01/12/2023  2:52 PM      Failed - Cr in normal range and within 360 days    Creat  Date Value Ref Range Status  12/08/2021 1.30 (H) 0.50 - 0.99 mg/dL Final         Passed - Valid encounter within last 12 months    Recent Outpatient Visits           8 months ago Cardiac microvascular disease   [place] [facility] [place], Salvadore Oxford, NP   1 year ago Seizure-like activity [facility])   [facility] [place], Salvadore Oxford, NP   1 year ago Encounter for general adult medical examination with abnormal findings    [facility] [place], Salvadore Oxford, NP   1 year ago COVID-19   [facility] [place], Salvadore Oxford, [place]

## 2023-01-12 NOTE — Telephone Encounter (Signed)
Called pt - left message on message to call back and schedule an appt.

## 2023-01-12 NOTE — Telephone Encounter (Signed)
Requested medications are due for refill today.  yes  Requested medications are on the active medications list.  yes  Last refill. varied  Future visit scheduled.   no  Notes to clinic.  2 med refills are not delegated. Pt needs an appt.    Requested Prescriptions  Pending Prescriptions Disp Refills   ranolazine (RANEXA) 500 MG 12 hr tablet [Pharmacy Med Name: Ranolazine 500mg  Extended-Release Tablet] 180 tablet 0    Sig: Take 2 tablets by mouth daily.     Not Delegated - Cardiovascular: Ranolazine Failed - 01/12/2023  1:22 PM      Failed - This refill cannot be delegated      Failed - K in normal range and within 360 days    Potassium  Date Value Ref Range Status  12/08/2021 4.1 3.5 - 5.3 mmol/L Final         Failed - Cr in normal range and within 360 days    Creat  Date Value Ref Range Status  12/08/2021 1.30 (H) 0.50 - 0.99 mg/dL Final         Passed - Last BP in normal range    BP Readings from Last 1 Encounters:  07/20/22 117/80         Passed - Valid encounter within last 12 months    Recent Outpatient Visits           8 months ago Cardiac microvascular disease   [place] [facility] [place], Salvadore Oxford, NP   1 year ago Seizure-like activity [facility])   [place] [facility] [place], Salvadore Oxford, NP   1 year ago Encounter for general adult medical examination with abnormal findings   [place] [facility] [place], Salvadore Oxford, NP   1 year ago COVID-19   [facility] [place], Salvadore Oxford, NP               QUEtiapine (SEROQUEL) 50 MG tablet [Pharmacy Med Name: Quetiapine Fumarate 50mg  Tablet] 180 tablet 0    Sig: Take 1/2 to 1 tablet by mouth twice daily.     Not Delegated - Psychiatry:  Antipsychotics - Second Generation (Atypical) - quetiapine Failed - 01/12/2023  1:22 PM      Failed - This refill cannot be delegated      Failed - TSH in normal range and within 360 days    TSH  Date Value  Ref Range Status  12/08/2021 2.27 mIU/L Final    Comment:              Reference Range .           > or = 20 Years  0.40-4.50 .                Pregnancy Ranges           First trimester    0.26-2.66           Second trimester   0.55-2.73           Third trimester    0.43-2.91          Failed - Completed PHQ-2 or PHQ-9 in the last 360 days      Failed - Valid encounter within last 6 months    Recent Outpatient Visits           8 months ago Cardiac microvascular disease   [place] [facility] [place], Salvadore Oxford, [place]  1 year ago Seizure-like activity [facility])   [place] [facility] [place], Salvadore Oxford, NP   1 year ago Encounter for general adult medical examination with abnormal findings   [place] [facility] [place], Salvadore Oxford, NP   1 year ago COVID-19   [facility], [place] W, NP              Failed - Lipid Panel in normal range within the last 12 months    Cholesterol  Date Value Ref Range Status  08/26/2021 176 <200 mg/dL Final   LDL Cholesterol (Calc)  Date Value Ref Range Status  08/26/2021 72 mg/dL (calc) Final    Comment:    Reference range: <100 . Desirable range <100 mg/dL for primary prevention;   <70 mg/dL for patients with CHD or diabetic patients  with > or = 2 CHD risk factors. Marland Kitchen LDL-C is now calculated using the Martin-Hopkins  calculation, which is a validated novel method providing  better accuracy than the Friedewald equation in the  estimation of LDL-C.  Horald Pollen et al. Lenox Ahr. 4098;119(14): 2061-2068  (http://education.[facility].com/faq/FAQ164)    HDL  Date Value Ref Range Status  08/26/2021 89 > OR = 50 mg/dL Final   Triglycerides  Date Value Ref Range Status  08/26/2021 74 <150 mg/dL Final         Failed - CBC within normal limits and completed in the last 12 months    WBC  Date Value Ref Range Status  12/08/2021 6.5 3.8 - 10.8 Thousand/uL Final    RBC  Date Value Ref Range Status  12/08/2021 3.86 3.80 - 5.10 Million/uL Final   Hemoglobin  Date Value Ref Range Status  12/08/2021 12.9 11.7 - 15.5 g/dL Final   HCT  Date Value Ref Range Status  12/08/2021 39.4 35.0 - 45.0 % Final   MCHC  Date Value Ref Range Status  12/08/2021 32.7 32.0 - 36.0 g/dL Final   [facility]  Date Value Ref Range Status  12/08/2021 33.4 (H) 27.0 - 33.0 pg Final   MCV  Date Value Ref Range Status  12/08/2021 102.1 (H) 80.0 - 100.0 fL Final   No results found for: "PLTCOUNTKUC", "LABPLAT", "POCPLA" RDW  Date Value Ref Range Status  12/08/2021 12.8 11.0 - 15.0 % Final         Failed - CMP within normal limits and completed in the last 12 months    Albumin  Date Value Ref Range Status  07/15/2020 4.4 3.5 - 5.2 g/dL Final   Alkaline Phosphatase  Date Value Ref Range Status  07/15/2020 53 39 - 117 U/L Final   Alkaline phosphatase (APISO)  Date Value Ref Range Status  12/08/2021 63 31 - 125 U/L Final   ALT  Date Value Ref Range Status  12/08/2021 11 6 - 29 U/L Final   AST  Date Value Ref Range Status  12/08/2021 18 10 - 35 U/L Final   BUN  Date Value Ref Range Status  12/08/2021 16 7 - 25 mg/dL Final   Calcium  Date Value Ref Range Status  12/08/2021 9.0 8.6 - 10.2 mg/dL Final   CO2  Date Value Ref Range Status  12/08/2021 26 20 - 32 mmol/L Final   Creat  Date Value Ref Range Status  12/08/2021 1.30 (H) 0.50 - 0.99 mg/dL Final   Glucose, Bld  Date Value Ref Range Status  12/08/2021 70 65 - 139 mg/dL Final  Comment:    .        Non-fasting reference interval .    Potassium  Date Value Ref Range Status  12/08/2021 4.1 3.5 - 5.3 mmol/L Final   Sodium  Date Value Ref Range Status  12/08/2021 143 135 - 146 mmol/L Final   Total Bilirubin  Date Value Ref Range Status  12/08/2021 0.3 0.2 - 1.2 mg/dL Final   Protein, UA  Date Value Ref Range Status  11/23/2017 neg  Final   Total Protein  Date Value Ref Range  Status  12/08/2021 6.6 6.1 - 8.1 g/dL Final   GFR  Date Value Ref Range Status  07/15/2020 49.68 (L) >60.00 mL/min Final    Comment:    Calculated using the CKD-EPI Creatinine Equation (2021)   eGFR  Date Value Ref Range Status  12/08/2021 52 (L) > OR = 60 mL/min/1.59m2 Final    Comment:    The eGFR is based on the CKD-EPI 2021 equation. To calculate  the new eGFR from a previous Creatinine or Cystatin C result, go to https://www.kidney.org/professionals/ kdoqi/gfr%5Fcalculator          Passed - Last BP in normal range    BP Readings from Last 1 Encounters:  07/20/22 117/80         Passed - Last Heart Rate in normal range    Pulse Readings from Last 1 Encounters:  07/20/22 74          venlafaxine XR (EFFEXOR-XR) 75 MG 24 hr capsule [Pharmacy Med Name: Venlafaxine Hydrochloride 75mg  Extended-Release Capsule] 270 capsule 0    Sig: Take 3 capsules by mouth daily with breakfast.     Psychiatry: Antidepressants - SNRI - desvenlafaxine & venlafaxine Failed - 01/12/2023  1:22 PM      Failed - Cr in normal range and within 360 days    Creat  Date Value Ref Range Status  12/08/2021 1.30 (H) 0.50 - 0.99 mg/dL Final         Failed - Completed PHQ-2 or PHQ-9 in the last 360 days      Failed - Valid encounter within last 6 months    Recent Outpatient Visits           8 months ago Cardiac microvascular disease   [place] [facility] [place], Salvadore Oxford, NP   1 year ago Seizure-like activity [facility])   [place] [facility] [place], Salvadore Oxford, NP   1 year ago Encounter for general adult medical examination with abnormal findings   [place] [facility] [place], Salvadore Oxford, NP   1 year ago COVID-19   [facility] [place], [place] W, NP              Failed - Lipid Panel in normal range within the last 12 months    Cholesterol  Date Value Ref Range Status  08/26/2021 176 <200 mg/dL Final   LDL  Cholesterol (Calc)  Date Value Ref Range Status  08/26/2021 72 mg/dL (calc) Final    Comment:    Reference range: <100 . Desirable range <100 mg/dL for primary prevention;   <70 mg/dL for patients with CHD or diabetic patients  with > or = 2 CHD risk factors. Marland Kitchen LDL-C is now calculated using the Martin-Hopkins  calculation, which is a validated novel method providing  better accuracy than the Friedewald equation in the  estimation of LDL-C.  Horald Pollen et al. Lenox Ahr. 1610;960(45): [phone]  (  http://education.[facility].com/faq/FAQ164)    HDL  Date Value Ref Range Status  08/26/2021 89 > OR = 50 mg/dL Final   Triglycerides  Date Value Ref Range Status  08/26/2021 74 <150 mg/dL Final         Passed - Last BP in normal range    BP Readings from Last 1 Encounters:  07/20/22 117/80         Signed Prescriptions Disp Refills   pramipexole (MIRAPEX) 0.5 MG tablet 90 tablet 0    Sig: Take 1 tablet by mouth at bedtime.     Neurology:  Parkinsonian Agents Passed - 01/12/2023  1:22 PM      Passed - Last BP in normal range    BP Readings from Last 1 Encounters:  07/20/22 117/80         Passed - Last Heart Rate in normal range    Pulse Readings from Last 1 Encounters:  07/20/22 74         Passed - Valid encounter within last 12 months    Recent Outpatient Visits           8 months ago Cardiac microvascular disease   [place] [facility] [place], Salvadore Oxford, NP   1 year ago Seizure-like activity [facility])   [place] [facility] [place], Salvadore Oxford, NP   1 year ago Encounter for general adult medical examination with abnormal findings    [facility] [place], Salvadore Oxford, NP   1 year ago COVID-19   [facility] [place], Salvadore [place], [place]

## 2023-02-19 IMAGING — MR MR HEAD W/O CM
15 series · 48 of 48 positions shown · non-contrast
Comparison: Head MRI 08/03/2020

CLINICAL DATA: Seizure-like activity.

EXAM:
MRI HEAD WITHOUT CONTRAST
TECHNIQUE: Multiplanar, multiecho pulse sequences of the brain and surrounding
structures were obtained without intravenous contrast.

[Series 2: T1 · sagittal · 5.0mm · 0.45mm/px · 1 of 21 slices shown]
[im 1/21]
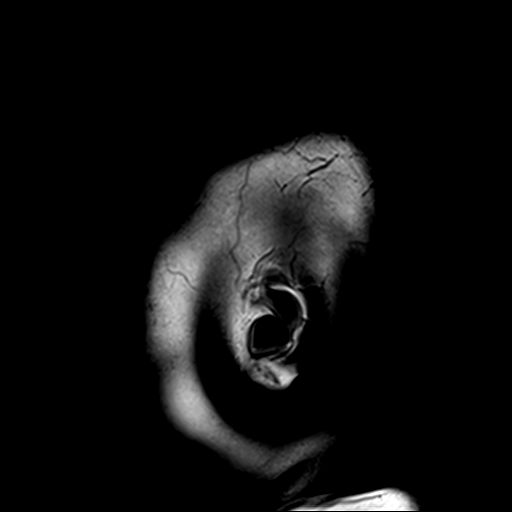

[Series 3: DWI · axial · 3.0mm · 1.80mm/px · z∈[-73,+73]mm · 5 of 100 slices shown (1 of 4)]
[im 1/100]
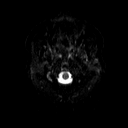
[im 25/100]
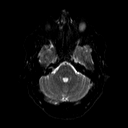
[im 50/100]
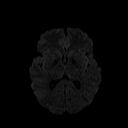
[im 75/100]
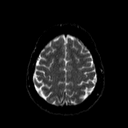
[im 100/100]
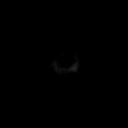

[Series 4: DWI · axial · 3.0mm · 1.80mm/px · z∈[-73,+73]mm · 3 of 50 slices shown (2 of 4)]
[im 1/50]
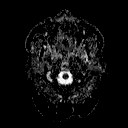
[im 25/50]
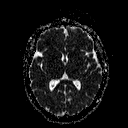
[im 50/50]
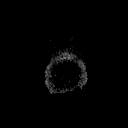

[Series 5: DWI · coronal · 5.0mm · 1.80mm/px · 4 of 68 slices shown (3 of 4)]
[im 1/68]
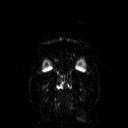
[im 23/68]
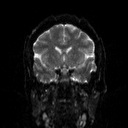
[im 45/68]
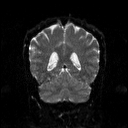
[im 68/68]
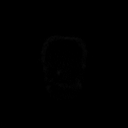

[Series 6: DWI · coronal · 5.0mm · 1.80mm/px · 2 of 34 slices shown (4 of 4)]
[im 1/34]
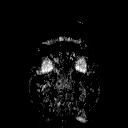
[im 34/34]
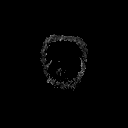

[Series 7: T2 · axial · 5.0mm · 0.51mm/px · 1 of 22 slices shown (1 of 3)]
[im 1/22]
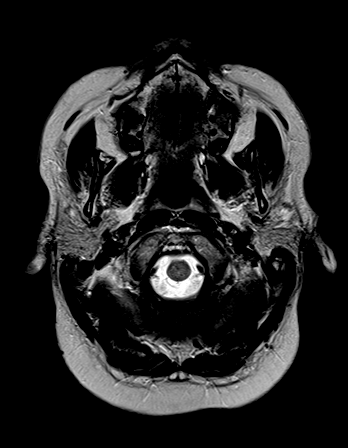

[Series 8: FLAIR · axial · 3.0mm · 0.45mm/px · z∈[-71,+72]mm · 2 of 32 slices shown (1 of 3)]
[im 1/32]
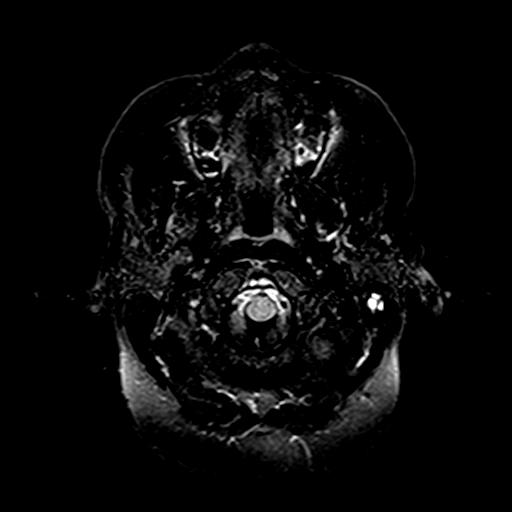
[im 32/32]
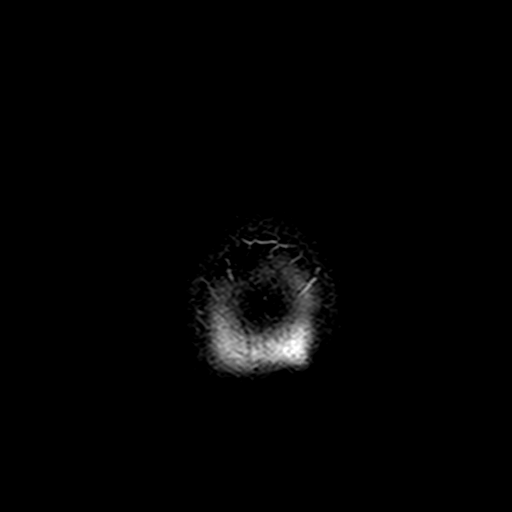

[Series 9: mip_images(sw) · axial · 32.0mm · 0.90mm/px · z∈[-57,+55]mm · 2 of 29 slices shown]
[im 1/29]
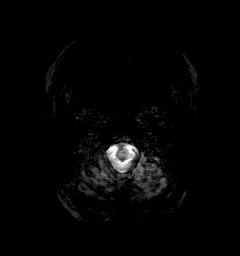
[im 29/29]
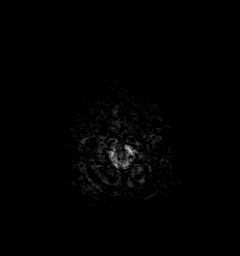

[Series 10: swi_images · axial · 4.0mm · 0.90mm/px · z∈[-70,+69]mm · 2 of 36 slices shown]
[im 1/36]
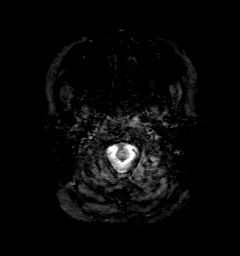
[im 36/36]
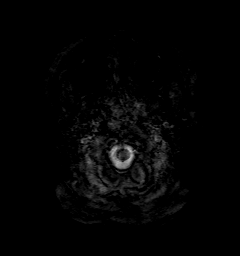

[Series 11: t1_mpr_tra · axial · 1.0mm · 0.71mm/px · z∈[-78,+80]mm · 9 of 160 slices shown]
[im 1/160]
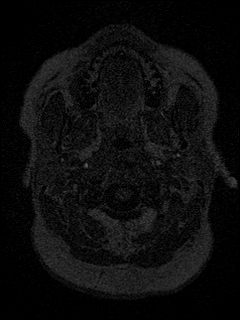
[im 20/160]
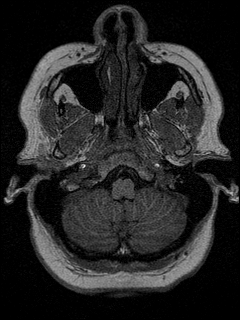
[im 40/160]
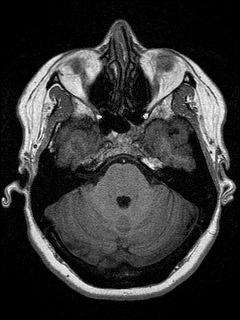
[im 60/160]
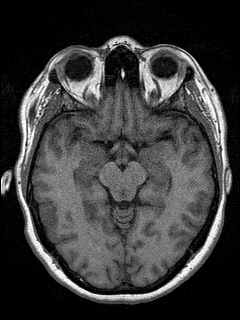
[im 80/160]
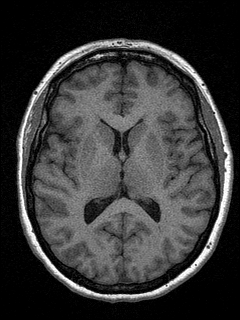
[im 100/160]
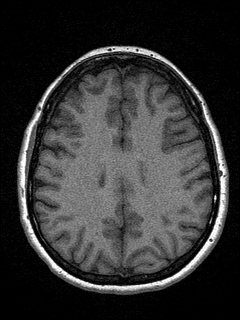
[im 120/160]
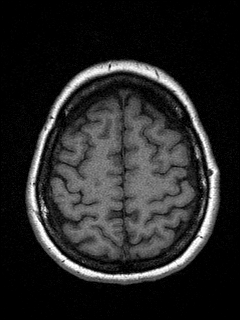
[im 140/160]
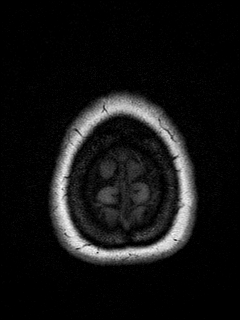
[im 160/160]
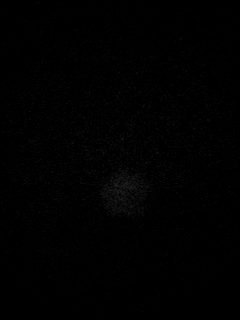

[Series 12: T2 · coronal · 3.0mm · 0.23mm/px · 2 of 28 slices shown (2 of 3)]
[im 1/28]
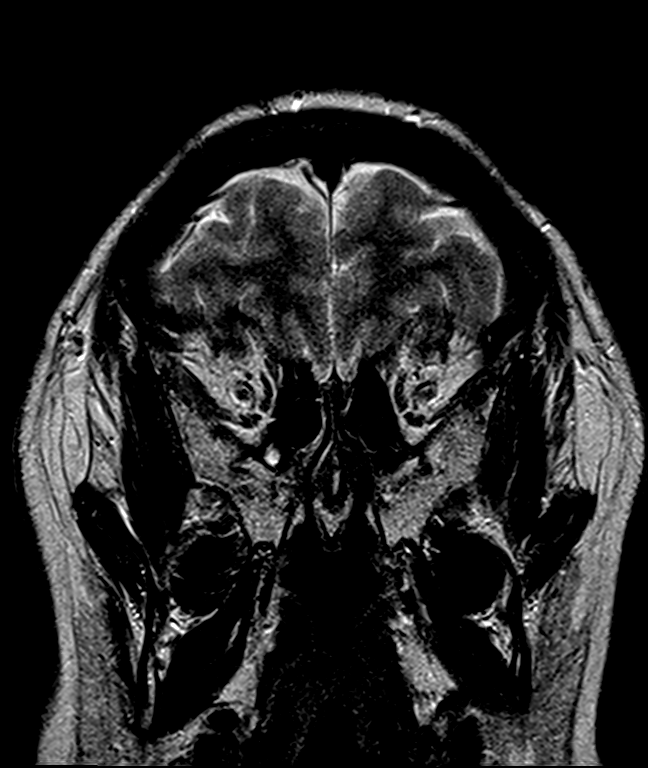
[im 28/28]
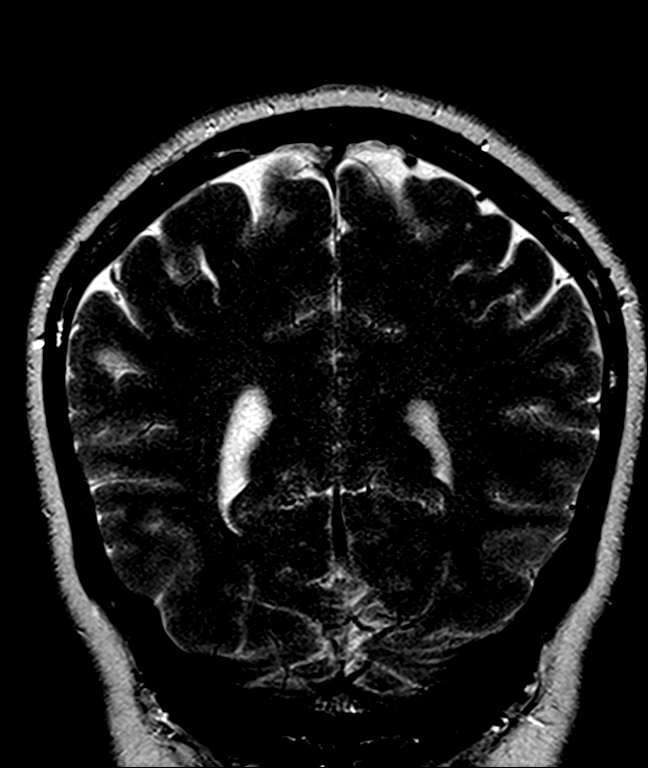

[Series 13: FLAIR · coronal · 3.0mm · 0.70mm/px · 2 of 28 slices shown (2 of 3)]
[im 1/28]
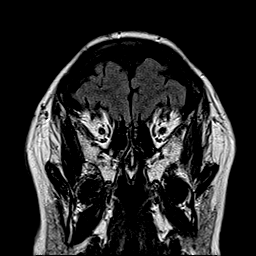
[im 28/28]
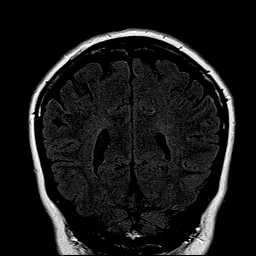

[Series 14: T2 · coronal · 5.0mm · 0.45mm/px · 1 of 25 slices shown (3 of 3)]
[im 1/25]
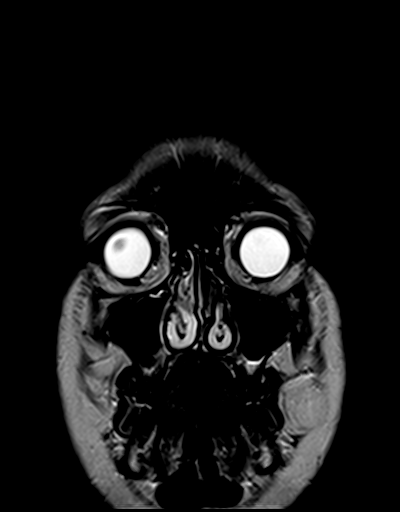

[Series 15: t2_spc_da-fl_sag_p2_iso · sagittal · 1.0mm · 1.02mm/px · 9 of 160 slices shown]
[im 1/160]
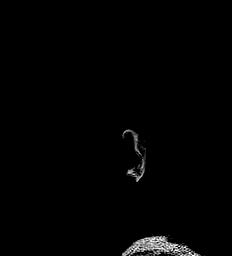
[im 20/160]
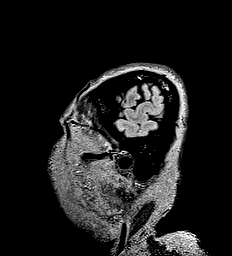
[im 40/160]
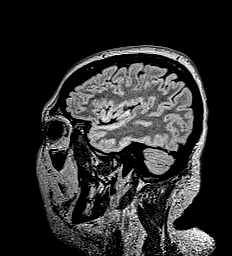
[im 60/160]
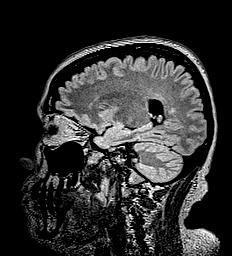
[im 80/160]
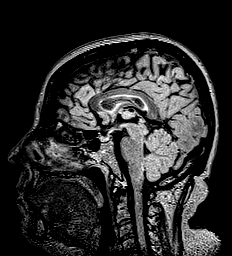
[im 100/160]
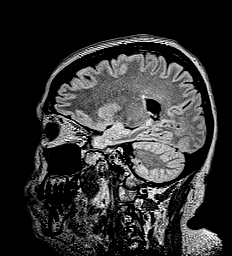
[im 120/160]
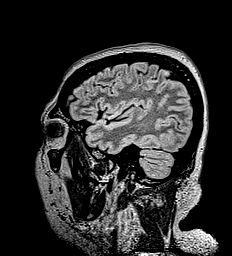
[im 140/160]
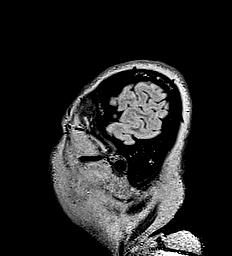
[im 160/160]
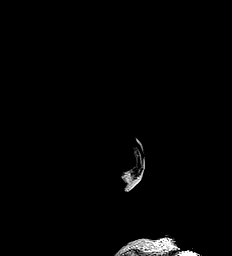

[Series 16: FLAIR · coronal · 1.0mm · 1.02mm/px · 3 of 58 slices shown (3 of 3)]
[im 1/58]
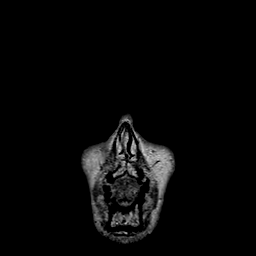
[im 29/58]
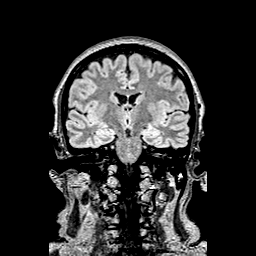
[im 58/58]
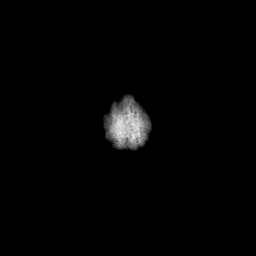

[48 of 48 positions shown; findings below may reference images not displayed]

FINDINGS: Brain: Dedicated thin section imaging through the temporal lobes
demonstrates normal volume and signal of the hippocampi. There is no
evidence of heterotopia or cortical dysplasia.

There is no evidence of an acute infarct, intracranial hemorrhage,
mass, midline shift, or extra-axial fluid collection. The ventricles
and sulci are normal. The brain is normal in signal.

Vascular: Major intracranial vascular flow voids are preserved.

Skull and upper cervical spine: Unremarkable bone marrow signal.

Sinuses/Orbits: Unremarkable orbits. Clear paranasal sinuses.
Unchanged trace right and small left mastoid effusions.

Other: None.
IMPRESSION: Unchanged and unremarkable appearance of the brain.

## 2023-03-20 ENCOUNTER — Other Ambulatory Visit: Payer: Self-pay | Admitting: Internal Medicine

## 2023-03-21 ENCOUNTER — Encounter: Payer: Self-pay | Admitting: Internal Medicine

## 2023-03-21 DIAGNOSIS — R252 Cramp and spasm: Secondary | ICD-10-CM

## 2023-03-21 DIAGNOSIS — G43819 Other migraine, intractable, without status migrainosus: Secondary | ICD-10-CM

## 2023-03-21 DIAGNOSIS — F419 Anxiety disorder, unspecified: Secondary | ICD-10-CM

## 2023-03-21 NOTE — Telephone Encounter (Signed)
Would it be okay for her to schedule a virtual visit?  Her last visit 09/23 was a virtual visit. (Last in person visit was 11/2021)   Thanks,    -Vernona Rieger

## 2023-03-22 MED ORDER — TOPIRAMATE 100 MG PO TABS: ORAL_TABLET | ORAL | 0 refills | Status: DC

## 2023-03-22 MED ORDER — AMLODIPINE BESYLATE 10 MG PO TABS: 10.0000 mg | ORAL_TABLET | Freq: Every day | ORAL | 0 refills | Status: DC

## 2023-03-22 MED ORDER — BUSPIRONE HCL 5 MG PO TABS: ORAL_TABLET | ORAL | 0 refills | Status: DC

## 2023-03-22 MED ORDER — QUETIAPINE FUMARATE 50 MG PO TABS: ORAL_TABLET | ORAL | 0 refills | Status: DC

## 2023-03-22 MED ORDER — HYDROXYZINE HCL 10 MG PO TABS
ORAL_TABLET | ORAL | 0 refills | Status: DC
Start: 2023-03-22 — End: 2023-07-22

## 2023-03-22 MED ORDER — VENLAFAXINE HCL ER 75 MG PO CP24: ORAL_CAPSULE | ORAL | 0 refills | Status: DC

## 2023-03-22 MED ORDER — CARVEDILOL 25 MG PO TABS: 25.0000 mg | ORAL_TABLET | Freq: Two times a day (BID) | ORAL | 0 refills | Status: DC

## 2023-03-22 MED ORDER — RANOLAZINE ER 500 MG PO TB12: 1000.0000 mg | ORAL_TABLET | Freq: Every day | ORAL | 0 refills | Status: DC

## 2023-03-22 MED ORDER — PRAMIPEXOLE DIHYDROCHLORIDE 0.5 MG PO TABS
ORAL_TABLET | ORAL | 0 refills | Status: DC
Start: 2023-03-22 — End: 2023-07-22

## 2023-03-22 MED ORDER — ATORVASTATIN CALCIUM 20 MG PO TABS: 20.0000 mg | ORAL_TABLET | Freq: Every day | ORAL | 0 refills | Status: DC

## 2023-03-22 NOTE — Telephone Encounter (Signed)
She needs labs, so she is going to have to come to the office anyway. She never did her labs ordered at her last virtual appt.

## 2023-03-22 NOTE — Addendum Note (Signed)
Addended by: Lorre Munroe on: 03/22/2023 12:51 PM   Modules accepted: Orders

## 2023-03-28 ENCOUNTER — Other Ambulatory Visit: Payer: Self-pay | Admitting: Internal Medicine

## 2023-03-30 NOTE — Telephone Encounter (Signed)
Requested medications are due for refill today.  no  Requested medications are on the active medications list.  yes  Last refill. 03/22/2023 30 day supply for each  Future visit scheduled.   no  Notes to clinic.  No pcp listed. Pt given courtesy refills. Labs are overdue. Pt is going to start with a new provider in 1 week.   Requested Prescriptions  Pending Prescriptions Disp Refills   QUEtiapine (SEROQUEL) 50 MG tablet [Pharmacy Med Name: Quetiapine Fumarate 50mg  Tablet] 60 tablet 0    Sig: Take 1/2 to 1 tablet by mouth twice daily.     Not Delegated - Psychiatry:  Antipsychotics - Second Generation (Atypical) - quetiapine Failed - 03/28/2023  2:35 PM      Failed - This refill cannot be delegated      Failed - TSH in normal range and within 360 days    TSH  Date Value Ref Range Status  12/08/2021 2.27 mIU/L Final    Comment:              Reference Range .           > or = 20 Years  0.40-4.50 .                Pregnancy Ranges           First trimester    0.26-2.66           Second trimester   0.55-2.73           Third trimester    0.43-2.91          Failed - Completed PHQ-2 or PHQ-9 in the last 360 days      Failed - Valid encounter within last 6 months    Recent Outpatient Visits           10 months ago Cardiac microvascular disease   [place] [facility] [place], Salvadore Oxford, NP   1 year ago Seizure-like activity [facility])   [place] [facility] [place], Salvadore Oxford, NP   1 year ago Encounter for general adult medical examination with abnormal findings   [place] [place], Salvadore Oxford, NP   2 years ago COVID-19   [place] [place], Salvadore Oxford, NP       Future Appointments             In 1 week Salvatore Decent, FNP  [facility]            Failed - Lipid Panel in normal range within the last 12 months    Cholesterol  Date Value Ref Range  Status  08/26/2021 176 <200 mg/dL Final   LDL Cholesterol (Calc)  Date Value Ref Range Status  08/26/2021 72 mg/dL (calc) Final    Comment:    Reference range: <100 . Desirable range <100 mg/dL for primary prevention;   <70 mg/dL for patients with CHD or diabetic patients  with > or = 2 CHD risk factors. Marland Kitchen LDL-C is now calculated using the Martin-Hopkins  calculation, which is a validated novel method providing  better accuracy than the Friedewald equation in the  estimation of LDL-C.  Horald Pollen et al. Lenox Ahr. 8119;147(82): 2061-2068  (http://education.[facility].com/faq/FAQ164)    HDL  Date Value Ref Range Status  08/26/2021 89 > OR = 50 mg/dL Final   Triglycerides  Date Value Ref Range Status  08/26/2021 74 <150  mg/dL Final         Failed - CBC within normal limits and completed in the last 12 months    WBC  Date Value Ref Range Status  12/08/2021 6.5 3.8 - 10.8 Thousand/uL Final   RBC  Date Value Ref Range Status  12/08/2021 3.86 3.80 - 5.10 Million/uL Final   Hemoglobin  Date Value Ref Range Status  12/08/2021 12.9 11.7 - 15.5 g/dL Final   HCT  Date Value Ref Range Status  12/08/2021 39.4 35.0 - 45.0 % Final   MCHC  Date Value Ref Range Status  12/08/2021 32.7 32.0 - 36.0 g/dL Final   [facility]  Date Value Ref Range Status  12/08/2021 33.4 (H) 27.0 - 33.0 pg Final   MCV  Date Value Ref Range Status  12/08/2021 102.1 (H) 80.0 - 100.0 fL Final   No results found for: "PLTCOUNTKUC", "LABPLAT", "POCPLA" RDW  Date Value Ref Range Status  12/08/2021 12.8 11.0 - 15.0 % Final         Failed - CMP within normal limits and completed in the last 12 months    Albumin  Date Value Ref Range Status  07/15/2020 4.4 3.5 - 5.2 g/dL Final   Alkaline Phosphatase  Date Value Ref Range Status  07/15/2020 53 39 - 117 U/L Final   Alkaline phosphatase (APISO)  Date Value Ref Range Status  12/08/2021 63 31 - 125 U/L Final   ALT  Date Value Ref Range Status   12/08/2021 11 6 - 29 U/L Final   AST  Date Value Ref Range Status  12/08/2021 18 10 - 35 U/L Final   BUN  Date Value Ref Range Status  12/08/2021 16 7 - 25 mg/dL Final   Calcium  Date Value Ref Range Status  12/08/2021 9.0 8.6 - 10.2 mg/dL Final   CO2  Date Value Ref Range Status  12/08/2021 26 20 - 32 mmol/L Final   Creat  Date Value Ref Range Status  12/08/2021 1.30 (H) 0.50 - 0.99 mg/dL Final   Glucose, Bld  Date Value Ref Range Status  12/08/2021 70 65 - 139 mg/dL Final    Comment:    .        Non-fasting reference interval .    Potassium  Date Value Ref Range Status  12/08/2021 4.1 3.5 - 5.3 mmol/L Final   Sodium  Date Value Ref Range Status  12/08/2021 143 135 - 146 mmol/L Final   Total Bilirubin  Date Value Ref Range Status  12/08/2021 0.3 0.2 - 1.2 mg/dL Final   Protein, UA  Date Value Ref Range Status  11/23/2017 neg  Final   Total Protein  Date Value Ref Range Status  12/08/2021 6.6 6.1 - 8.1 g/dL Final   GFR  Date Value Ref Range Status  07/15/2020 49.68 (L) >60.00 mL/min Final    Comment:    Calculated using the CKD-EPI Creatinine Equation (2021)   eGFR  Date Value Ref Range Status  12/08/2021 52 (L) > OR = 60 mL/min/1.47m2 Final    Comment:    The eGFR is based on the CKD-EPI 2021 equation. To calculate  the new eGFR from a previous Creatinine or Cystatin C result, go to https://www.kidney.org/professionals/ kdoqi/gfr%5Fcalculator          Passed - Last BP in normal range    BP Readings from Last 1 Encounters:  07/20/22 117/80         Passed - Last Heart Rate in normal range  Pulse Readings from Last 1 Encounters:  07/20/22 74          venlafaxine XR (EFFEXOR-XR) 75 MG 24 hr capsule [Pharmacy Med Name: Venlafaxine Hydrochloride 75mg  Extended-Release Capsule] 90 capsule 0    Sig: Take 3 capsules by mouth daily with breakfast.     Psychiatry: Antidepressants - SNRI - desvenlafaxine & venlafaxine Failed - 03/28/2023  2:35  PM      Failed - Cr in normal range and within 360 days    Creat  Date Value Ref Range Status  12/08/2021 1.30 (H) 0.50 - 0.99 mg/dL Final         Failed - Completed PHQ-2 or PHQ-9 in the last 360 days      Failed - Valid encounter within last 6 months    Recent Outpatient Visits           10 months ago Cardiac microvascular disease   [place] [facility] [place], Salvadore Oxford, NP   1 year ago Seizure-like activity [facility])   [place] [facility] [place], Salvadore Oxford, NP   1 year ago Encounter for general adult medical examination with abnormal findings   [place] [facility] [place], Salvadore Oxford, NP   2 years ago COVID-19   [facility] [place], Salvadore Oxford, NP       Future Appointments             In 1 week Salvatore Decent, FNP [facility]            Failed - Lipid Panel in normal range within the last 12 months    Cholesterol  Date Value Ref Range Status  08/26/2021 176 <200 mg/dL Final   LDL Cholesterol (Calc)  Date Value Ref Range Status  08/26/2021 72 mg/dL (calc) Final    Comment:    Reference range: <100 . Desirable range <100 mg/dL for primary prevention;   <70 mg/dL for patients with CHD or diabetic patients  with > or = 2 CHD risk factors. Marland Kitchen LDL-C is now calculated using the Martin-Hopkins  calculation, which is a validated novel method providing  better accuracy than the Friedewald equation in the  estimation of LDL-C.  Horald Pollen et al. Lenox Ahr. 1610;960(45): 2061-2068  (http://education.[facility].com/faq/FAQ164)    HDL  Date Value Ref Range Status  08/26/2021 89 > OR = 50 mg/dL Final   Triglycerides  Date Value Ref Range Status  08/26/2021 74 <150 mg/dL Final         Passed - Last BP in normal range    BP Readings from Last 1 Encounters:  07/20/22 117/80

## 2023-04-09 NOTE — Progress Notes (Deleted)
[facility] [address] [place] [address] Dept: [phone] Dept Fax: [phone]  New Patient Office Visit  Subjective:   Dana Miller 03-09-1976 04/10/2023  No chief complaint on file.   HPI: Dana Miller presents today to establish care at [facility]. Introduced to Publishing rights manager role and practice setting.  All questions answered.  Concerns: See below   DEPRESSION and ANXIETY: Dana Miller presents for the medical management of depression and anxiety.  Current medication regimen: *** Counseling: *** Well controlled: *** Denies ***SI/HI.     12/09/2021    8:38 AM 08/26/2021    2:40 PM 07/15/2020    3:44 PM  PHQ9 SCORE ONLY  PHQ-9 Total Score 15 14 0      12/09/2021    8:39 AM 08/26/2021    2:41 PM 07/29/2019    2:34 PM 05/01/2019   11:38 AM  GAD 7 : Generalized Anxiety Score  Nervous, Anxious, on Edge 3 3 2 3   Control/stop worrying 2 3 2 3   Worry too much - different things 2 3 2 3   Trouble relaxing 3 3 2 3   Restless 1 3 1 3   Easily annoyed or irritable 3 2 2 3   Afraid - awful might happen 1 1 1 2   Total GAD 7 Score 15 18 12 20   Anxiety Difficulty  Somewhat difficult  Very difficult       The following portions of the patient's history were reviewed and updated as appropriate: past medical history, past surgical history, family history, social history, allergies, medications, and problem list.   Patient Active Problem List   Diagnosis Date Noted   CKD (chronic kidney disease) 05/09/2022   Class 2 severe obesity due to excess calories with serious comorbidity and body mass index (BMI) of 36.0 to 36.9 in adult (HCC) 08/27/2021   RLS (restless legs syndrome) 02/08/2017   Anxiety and depression 12/29/2014   Cardiac microvascular disease 12/29/2014   Migraines 12/29/2014   Past Medical History:  Diagnosis Date   Anxiety    Cardiac microvascular disease     Chicken pox    Depression    Thyroid mass    right side   Past Surgical History:  Procedure Laterality Date   CESAREAN SECTION     CHOLECYSTECTOMY  2009   SEPTOPLASTY     THYROID SURGERY Right    thyroidectomy   TUBAL LIGATION     Family History  Problem Relation Age of Onset   Stroke Father    Cancer Father        Kidney    AAA (abdominal aortic aneurysm) Father    Depression Sister    Anxiety disorder Sister    Cancer Paternal Uncle        Lung   Depression Maternal Grandmother    Heart disease Paternal Grandmother    Stroke Paternal Grandmother    Diabetes Paternal Grandmother    Diabetes Paternal Grandfather     Current Outpatient Medications:    amLODipine (NORVASC) 10 MG tablet, Take 1 tablet (10 mg total) by mouth daily., Disp: 30 tablet, Rfl: 0   aspirin EC 81 MG tablet, Take 1 tablet by mouth daily., Disp: , Rfl:    atorvastatin (LIPITOR) 20 MG tablet, Take 1 tablet (20 mg total) by mouth daily., Disp: 30 tablet, Rfl: 0   busPIRone (BUSPAR) 5 MG tablet, Take 1 tablet by mouth twice daily., Disp: 60 tablet, Rfl: 0   carvedilol (  COREG) 25 MG tablet, Take 1 tablet (25 mg total) by mouth 2 (two) times daily., Disp: 60 tablet, Rfl: 0   EPINEPHrine 0.3 mg/0.3 mL IJ SOAJ injection, Inject 0.3 mLs (0.3 mg total) into the muscle as needed. For Anaphylaxis, Disp: 1 Device, Rfl: 0   fexofenadine (ALLEGRA) 180 MG tablet, Take 1 tablet by mouth daily., Disp: , Rfl:    hydrOXYzine (ATARAX) 10 MG tablet, Take 1 tablet by mouth twice daily as needed., Disp: 60 tablet, Rfl: 0   nitroGLYCERIN (NITROSTAT) 0.4 MG SL tablet, Dissolve 1 tablet under the tongue every 5 minutes as needed for chest pain. Maximum daily dose is 3 tablets., Disp: 25 tablet, Rfl: 0   pramipexole (MIRAPEX) 0.5 MG tablet, Take 1 tablet by mouth at bedtime., Disp: 30 tablet, Rfl: 0   QUEtiapine (SEROQUEL) 50 MG tablet, Take 1/2 to 1 tablet by mouth twice daily., Disp: 60 tablet, Rfl: 0   ranolazine (RANEXA) 500  MG 12 hr tablet, Take 2 tablets (1,000 mg total) by mouth daily., Disp: 30 tablet, Rfl: 0   Selenium Sulfide 2.25 % SHAM, Apply 1 application topically daily as needed., Disp: 1 Bottle, Rfl: 3   topiramate (TOPAMAX) 100 MG tablet, Take 1 tablet by mouth daily., Disp: 30 tablet, Rfl: 0   venlafaxine XR (EFFEXOR-XR) 75 MG 24 hr capsule, Take 3 capsules by mouth daily with breakfast., Disp: 90 capsule, Rfl: 0 Allergies  Allergen Reactions   Fentanyl Swelling   Mirtazapine Swelling   Penicillins Anaphylaxis   Sulfa Antibiotics Hives   Ms Contin  [Morphine] Swelling    ROS: A complete ROS was performed with pertinent positives/negatives noted in the HPI. The remainder of the ROS are negative.   Objective:   There were no vitals filed for this visit.  GENERAL: Well-appearing, in NAD. Well nourished.  SKIN: Pink, warm and dry. No rash, lesion, ulceration, or ecchymoses.  NECK: Trachea midline. Full ROM w/o pain or tenderness. No lymphadenopathy.  RESPIRATORY: Chest wall symmetrical. Respirations even and non-labored. Breath sounds clear to auscultation bilaterally.  CARDIAC: S1, S2 present, regular rate and rhythm. Peripheral pulses 2+ bilaterally.  MSK: Muscle tone and strength appropriate for age. Joints w/o tenderness, redness, or swelling.  EXTREMITIES: Without clubbing, cyanosis, or edema.  NEUROLOGIC: No motor or sensory deficits. Steady, even gait.  PSYCH/MENTAL STATUS: Alert, oriented x 3. Cooperative, appropriate mood and affect.   Health Maintenance Due  Topic Date Due   COVID-19 Vaccine (1) Never done   Colonoscopy  Never done   Medicare Annual Wellness (AWV)  07/15/2021   INFLUENZA VACCINE  03/15/2023    No results found for any visits on 04/10/23.  Assessment & Plan:   There are no diagnoses linked to this encounter.   No follow-ups on file.   Salvatore Decent, FNP

## 2023-04-10 ENCOUNTER — Ambulatory Visit: Payer: Medicare (Managed Care) | Admitting: Internal Medicine

## 2023-05-09 ENCOUNTER — Emergency Department (HOSPITAL_BASED_OUTPATIENT_CLINIC_OR_DEPARTMENT_OTHER): Payer: Medicare (Managed Care)

## 2023-05-09 ENCOUNTER — Emergency Department (HOSPITAL_BASED_OUTPATIENT_CLINIC_OR_DEPARTMENT_OTHER)
Admission: EM | Admit: 2023-05-09 | Discharge: 2023-05-09 | Disposition: A | Discharge status: Home / Self Care | Payer: Medicare (Managed Care) | Attending: Emergency Medicine | Admitting: Emergency Medicine

## 2023-05-09 ENCOUNTER — Other Ambulatory Visit: Payer: Self-pay

## 2023-05-09 ENCOUNTER — Encounter (HOSPITAL_BASED_OUTPATIENT_CLINIC_OR_DEPARTMENT_OTHER): Payer: Self-pay | Admitting: Emergency Medicine

## 2023-05-09 DIAGNOSIS — Z1152 Encounter for screening for COVID-19: Secondary | ICD-10-CM | POA: Diagnosis not present

## 2023-05-09 DIAGNOSIS — Z7982 Long term (current) use of aspirin: Secondary | ICD-10-CM | POA: Diagnosis not present

## 2023-05-09 DIAGNOSIS — R0602 Shortness of breath: Secondary | ICD-10-CM | POA: Diagnosis not present

## 2023-05-09 DIAGNOSIS — J40 Bronchitis, not specified as acute or chronic: Secondary | ICD-10-CM | POA: Diagnosis not present

## 2023-05-09 DIAGNOSIS — R079 Chest pain, unspecified: Secondary | ICD-10-CM | POA: Diagnosis not present

## 2023-05-09 LAB — CBC WITH DIFFERENTIAL/PLATELET
Abs Immature Granulocytes: 0.02 10*3/uL (ref 0.00–0.07)
Basophils Absolute: 0.1 10*3/uL (ref 0.0–0.1)
Basophils Relative: 1 %
Eosinophils Absolute: 0.4 10*3/uL (ref 0.0–0.5)
Eosinophils Relative: 5 %
HCT: 38.8 % (ref 36.0–46.0)
Hemoglobin: 13 g/dL (ref 12.0–15.0)
Immature Granulocytes: 0 %
Lymphocytes Relative: 19 %
Lymphs Abs: 1.3 10*3/uL (ref 0.7–4.0)
MCH: 33.1 pg (ref 26.0–34.0)
MCHC: 33.5 g/dL (ref 30.0–36.0)
MCV: 98.7 fL (ref 80.0–100.0)
Monocytes Absolute: 0.4 10*3/uL (ref 0.1–1.0)
Monocytes Relative: 6 %
Neutro Abs: 4.9 10*3/uL (ref 1.7–7.7)
Neutrophils Relative %: 69 %
Platelets: 392 10*3/uL (ref 150–400)
RBC: 3.93 MIL/uL (ref 3.87–5.11)
RDW: 13 % (ref 11.5–15.5)
WBC: 7.1 10*3/uL (ref 4.0–10.5)
nRBC: 0 % (ref 0.0–0.2)

## 2023-05-09 LAB — BASIC METABOLIC PANEL
Anion gap: 12 (ref 5–15)
BUN: 18 mg/dL (ref 6–20)
CO2: 22 mmol/L (ref 22–32)
Calcium: 8.9 mg/dL (ref 8.9–10.3)
Chloride: 103 mmol/L (ref 98–111)
Creatinine, Ser: 1.03 mg/dL — ABNORMAL HIGH (ref 0.44–1.00)
GFR, Estimated: >60 mL/min (ref >60)
Glucose, Bld: 116 mg/dL — ABNORMAL HIGH (ref 70–99)
Potassium: 4.1 mmol/L (ref 3.5–5.1)
Sodium: 137 mmol/L (ref 135–145)

## 2023-05-09 LAB — RESP PANEL BY RT-PCR (RSV, FLU A&B, COVID)  RVPGX2
Influenza A by PCR: NEGATIVE
Influenza B by PCR: NEGATIVE
Resp Syncytial Virus by PCR: NEGATIVE
SARS Coronavirus 2 by RT PCR: NEGATIVE

## 2023-05-09 LAB — BRAIN NATRIURETIC PEPTIDE: B Natriuretic Peptide: 36.7 pg/mL (ref 0.0–100.0)

## 2023-05-09 LAB — TROPONIN I (HIGH SENSITIVITY)
Troponin I (High Sensitivity): 2 ng/L (ref <18)
Troponin I (High Sensitivity): <2 ng/L (ref <18)

## 2023-05-09 MED ORDER — PREDNISONE 10 MG (21) PO TBPK: ORAL_TABLET | ORAL | 0 refills | Status: DC

## 2023-05-09 MED ORDER — ONDANSETRON HCL 4 MG/2ML IJ SOLN
4.0000 mg | Freq: Once | INTRAMUSCULAR | Status: AC
  Administered 2023-05-09: 4 mg via INTRAVENOUS
  Filled 2023-05-09: qty 2

## 2023-05-09 MED ORDER — IPRATROPIUM-ALBUTEROL 0.5-2.5 (3) MG/3ML IN SOLN
3.0000 mL | Freq: Three times a day (TID) | RESPIRATORY_TRACT | Status: DC | PRN
  Administered 2023-05-09 (×3): 3 mL via RESPIRATORY_TRACT
  Filled 2023-05-09 (×3): qty 3

## 2023-05-09 MED ORDER — ALBUTEROL SULFATE HFA 108 (90 BASE) MCG/ACT IN AERS
2.0000 | INHALATION_SPRAY | RESPIRATORY_TRACT | Status: DC | PRN
  Administered 2023-05-09: 2 via RESPIRATORY_TRACT
  Filled 2023-05-09: qty 6.7

## 2023-05-09 MED ORDER — HYDROMORPHONE HCL 1 MG/ML IJ SOLN
0.5000 mg | Freq: Once | INTRAMUSCULAR | Status: AC
  Administered 2023-05-09: 0.5 mg via INTRAVENOUS
  Filled 2023-05-09: qty 1

## 2023-05-09 MED ORDER — PREDNISONE 50 MG PO TABS
60.0000 mg | ORAL_TABLET | Freq: Once | ORAL | Status: AC
  Administered 2023-05-09: 60 mg via ORAL
  Filled 2023-05-09: qty 1

## 2023-05-09 NOTE — ED Triage Notes (Signed)
Pt states has had mild URI Sx X 3 days, about 2 hours ago was sitting up in bed and started having SOB, tried laying down and was no better.

## 2023-05-09 NOTE — ED Provider Notes (Signed)
[facility]  Provider Note  CSN: [id number] Arrival date & time: 05/09/23 7829  History Chief Complaint  Patient presents with   Shortness of Breath    Dana Miller is a 47 y.o. female with history of cardiac microvascular disease reports she was having a restless leg attack a few hours ago and while walking around the house began having SOB, and then some chest discomfort. She reports some recent URI symptoms, but no fever. No leg swelling. No cough. She does not typically have any breathing problems.    Home Medications Prior to Admission medications   Medication Sig Start Date End Date Taking? Authorizing Provider  predniSONE (STERAPRED UNI-PAK 21 TAB) 10 MG (21) TBPK tablet 10mg  Tabs, 6 day taper. Use as directed 05/09/23  Yes Pollyann Savoy, MD  amLODipine (NORVASC) 10 MG tablet Take 1 tablet (10 mg total) by mouth daily. 03/22/23   Lorre Munroe, NP  aspirin EC 81 MG tablet Take 1 tablet by mouth daily.    [provider]  atorvastatin (LIPITOR) 20 MG tablet Take 1 tablet (20 mg total) by mouth daily. 03/22/23   Lorre Munroe, NP  busPIRone (BUSPAR) 5 MG tablet Take 1 tablet by mouth twice daily. 03/22/23   Lorre Munroe, NP  carvedilol (COREG) 25 MG tablet Take 1 tablet (25 mg total) by mouth 2 (two) times daily. 03/22/23 09/27/24  Lorre Munroe, NP  EPINEPHrine 0.3 mg/0.3 mL IJ SOAJ injection Inject 0.3 mLs (0.3 mg total) into the muscle as needed. For Anaphylaxis 03/03/16   Lorre Munroe, NP  fexofenadine (ALLEGRA) 180 MG tablet Take 1 tablet by mouth daily.    [provider]  hydrOXYzine (ATARAX) 10 MG tablet Take 1 tablet by mouth twice daily as needed. 03/22/23   Lorre Munroe, NP  nitroGLYCERIN (NITROSTAT) 0.4 MG SL tablet Dissolve 1 tablet under the tongue every 5 minutes as needed for chest pain. Maximum daily dose is 3 tablets. 05/16/22   Lorre Munroe, NP  pramipexole (MIRAPEX) 0.5 MG tablet Take 1  tablet by mouth at bedtime. 03/22/23   Lorre Munroe, NP  QUEtiapine (SEROQUEL) 50 MG tablet Take 1/2 to 1 tablet by mouth twice daily. 03/22/23   Lorre Munroe, NP  ranolazine (RANEXA) 500 MG 12 hr tablet Take 2 tablets (1,000 mg total) by mouth daily. 03/22/23   Lorre Munroe, NP  Selenium Sulfide 2.25 % SHAM Apply 1 application topically daily as needed. 01/06/15   Lorre Munroe, NP  topiramate (TOPAMAX) 100 MG tablet Take 1 tablet by mouth daily. 03/22/23   Lorre Munroe, NP  venlafaxine XR (EFFEXOR-XR) 75 MG 24 hr capsule Take 3 capsules by mouth daily with breakfast. 03/22/23   Lorre Munroe, NP     Allergies    Fentanyl, Mirtazapine, Penicillins, Sulfa antibiotics, and Ms contin  [morphine]   Review of Systems   Review of Systems Please see HPI for pertinent positives and negatives  Physical Exam BP 125/87   Pulse (!) 105   Temp 98.3 F (36.8 C) (Oral)   Resp 17   Ht 5\' 6"  (1.676 m)   Wt 99.8 kg   LMP 04/29/2023 (Exact Date)   SpO2 96%   BMI 35.51 kg/m   Physical Exam Vitals and nursing note reviewed.  Constitutional:      Appearance: Normal appearance.  HENT:     Head: Normocephalic and atraumatic.  Nose: Nose normal.     Mouth/Throat:     Mouth: Mucous membranes are moist.  Eyes:     Extraocular Movements: Extraocular movements intact.     Conjunctiva/sclera: Conjunctivae normal.  Cardiovascular:     Rate and Rhythm: Normal rate.  Pulmonary:     Effort: Pulmonary effort is normal.     Breath sounds: Wheezing present.  Abdominal:     General: Abdomen is flat.     Palpations: Abdomen is soft.     Tenderness: There is no abdominal tenderness.  Musculoskeletal:        General: No swelling. Normal range of motion.     Cervical back: Neck supple.     Right lower leg: No edema.     Left lower leg: No edema.  Skin:    General: Skin is warm and dry.  Neurological:     General: No focal deficit present.     Mental Status: She is alert.  Psychiatric:         Mood and Affect: Mood normal.     ED Results / Procedures / Treatments   EKG EKG Interpretation Date/Time:  Wednesday May 09 2023 02:50:20 EDT Ventricular Rate:  97 PR Interval:  142 QRS Duration:  91 QT Interval:  336 QTC Calculation: 427 R Axis:   43  Text Interpretation: Sinus rhythm Low voltage, precordial leads No significant change since last tracing Confirmed by Susy Frizzle [phone]) on 05/09/2023 2:55:10 AM  Procedures Procedures  Medications Ordered in the ED Medications  ipratropium-albuterol (DUONEB) 0.5-2.5 (3) MG/3ML nebulizer solution 3 mL (3 mLs Nebulization Given 05/09/23 0301)  albuterol (VENTOLIN HFA) 108 (90 Base) MCG/ACT inhaler 2 puff (2 puffs Inhalation Given 05/09/23 0449)  HYDROmorphone (DILAUDID) injection 0.5 mg (0.5 mg Intravenous Given 05/09/23 0347)  ondansetron (ZOFRAN) injection 4 mg (4 mg Intravenous Given 05/09/23 0438)  predniSONE (DELTASONE) tablet 60 mg (60 mg Oral Given 05/09/23 0453)    Initial Impression and Plan  Patient here with recent mild URI symptoms but had sudden onset of SOB and chest discomfort a short time prior to arrival. Has wheezing on exam, but not in any distress. Will give duoneb, check labs, CXR.   ED Course   Clinical Course as of 05/09/23 0508  Wed May 09, 2023  0258 CBC is normal.  [CS]  0317 BMP is unremarkable.  [CS]  0329 Trop and BNP are normal.  [CS]  0337 Covid/Flu/RSV swab is negative.  [CS]  0345 Patient reports breathing is improved. She is requesting pain medication for her chest pain which she attributes to her history of microvascular disease.  [CS]  [phone] I personally viewed the images from radiology studies and agree with radiologist interpretation: CXR is clear.  [CS]  0500 Patient resting comfortably. Will give prednisone and albuterol HFA for presumed viral bronchitis.  [CS]  0507 Repeat Trop remains normal. Patient's symptoms improved. No signs of ACS, PNA, or CHF. Will treat as above. She  is comfortable going home. Recommend PCP follow up, RTED for any other concerns.   [CS]    Clinical Course User Index [CS] Pollyann Savoy, MD     MDM Rules/Calculators/A&P Medical Decision Making Given presenting complaint, I considered that admission might be necessary. After review of results from ED lab and/or imaging studies, admission to the hospital is not indicated at this time.    Problems Addressed: Bronchitis: acute illness or injury  Amount and/or Complexity of Data Reviewed Labs: ordered. Decision-making details documented in  ED Course. Radiology: ordered and independent interpretation performed. Decision-making details documented in ED Course. ECG/medicine tests: ordered and independent interpretation performed. Decision-making details documented in ED Course.  Risk Prescription drug management. Decision regarding hospitalization.     Final Clinical Impression(s) / ED Diagnoses Final diagnoses:  Bronchitis    Rx / DC Orders ED Discharge Orders          Ordered    predniSONE (STERAPRED UNI-PAK 21 TAB) 10 MG (21) TBPK tablet        05/09/23 0347             Pollyann Savoy, MD 05/09/23 [phone]

## 2023-05-15 ENCOUNTER — Encounter: Payer: Self-pay | Admitting: Internal Medicine

## 2023-05-15 ENCOUNTER — Ambulatory Visit (INDEPENDENT_AMBULATORY_CARE_PROVIDER_SITE_OTHER): Payer: Medicare (Managed Care) | Admitting: Internal Medicine

## 2023-05-15 VITALS — BP 116/78 | HR 81 | Temp 96.2°F | Wt 220.0 lb

## 2023-05-15 DIAGNOSIS — E041 Nontoxic single thyroid nodule: Secondary | ICD-10-CM | POA: Diagnosis not present

## 2023-05-15 DIAGNOSIS — E66812 Obesity, class 2: Secondary | ICD-10-CM | POA: Diagnosis not present

## 2023-05-15 DIAGNOSIS — G2581 Restless legs syndrome: Secondary | ICD-10-CM

## 2023-05-15 DIAGNOSIS — I2585 Chronic coronary microvascular dysfunction: Secondary | ICD-10-CM | POA: Diagnosis not present

## 2023-05-15 DIAGNOSIS — F32A Depression, unspecified: Secondary | ICD-10-CM

## 2023-05-15 DIAGNOSIS — F419 Anxiety disorder, unspecified: Secondary | ICD-10-CM

## 2023-05-15 DIAGNOSIS — R739 Hyperglycemia, unspecified: Secondary | ICD-10-CM

## 2023-05-15 DIAGNOSIS — Z6835 Body mass index (BMI) 35.0-35.9, adult: Secondary | ICD-10-CM

## 2023-05-15 DIAGNOSIS — G43819 Other migraine, intractable, without status migrainosus: Secondary | ICD-10-CM

## 2023-05-15 MED ORDER — VENLAFAXINE HCL ER 75 MG PO CP24: ORAL_CAPSULE | ORAL | 1 refills | Status: DC

## 2023-05-15 MED ORDER — CARVEDILOL 25 MG PO TABS: 25.0000 mg | ORAL_TABLET | Freq: Two times a day (BID) | ORAL | 1 refills | Status: DC

## 2023-05-15 MED ORDER — QUETIAPINE FUMARATE 50 MG PO TABS: ORAL_TABLET | ORAL | 1 refills | Status: DC

## 2023-05-15 MED ORDER — AMLODIPINE BESYLATE 10 MG PO TABS: 10.0000 mg | ORAL_TABLET | Freq: Every day | ORAL | 1 refills | Status: DC

## 2023-05-15 NOTE — Assessment & Plan Note (Signed)
Lipid profile today Continue amlodipine, carvedilol, aspirin, atorvastatin and Ranexa She will continue to follow with cardiology

## 2023-05-15 NOTE — Assessment & Plan Note (Signed)
Encourage stress reduction techniques Continue Topamax and Imitrex

## 2023-05-15 NOTE — Progress Notes (Signed)
Subjective:    Patient ID: Dana Miller, female    DOB: 10/29/1975, 47 y.o.   MRN: [id number]  HPI  Patient presents to clinic today for follow-up of chronic conditions.  Anxiety and depression: Chronic, managed on venlafaxine, buspirone, seroquel and hydroxyzine.  She is not currently seeing a therapist or psychiatrist.  She denies SI/HI.  Chronic microvascular heart disease: Her last LDL was 72, triglycerides 74, 08/2021.  She reports intermittent chest pain.  She is taking amlodipine, carvedilol, aspirin, atorvastatin and ranexa as prescribed.  She follows with cardiology.  Migraines: These occur a few times per week.  Triggered by stress.  She is taking topamax and imitrex as prescribed.  She does not follow with neurology.  RLS: Managed with pramipexole.  She does not follow with neurology.  Thyroid nodule: She is due for repeat ultrasound.  Last thyroid ultrasound 10/2021  Review of Systems  Past Medical History:  Diagnosis Date   Anxiety    Cardiac microvascular disease    Chicken pox    Depression    Thyroid mass    right side    Current Outpatient Medications  Medication Sig Dispense Refill   amLODipine (NORVASC) 10 MG tablet Take 1 tablet (10 mg total) by mouth daily. 30 tablet 0   aspirin EC 81 MG tablet Take 1 tablet by mouth daily.     atorvastatin (LIPITOR) 20 MG tablet Take 1 tablet (20 mg total) by mouth daily. 30 tablet 0   busPIRone (BUSPAR) 5 MG tablet Take 1 tablet by mouth twice daily. 60 tablet 0   carvedilol (COREG) 25 MG tablet Take 1 tablet (25 mg total) by mouth 2 (two) times daily. 60 tablet 0   EPINEPHrine 0.3 mg/0.3 mL IJ SOAJ injection Inject 0.3 mLs (0.3 mg total) into the muscle as needed. For Anaphylaxis 1 Device 0   fexofenadine (ALLEGRA) 180 MG tablet Take 1 tablet by mouth daily.     hydrOXYzine (ATARAX) 10 MG tablet Take 1 tablet by mouth twice daily as needed. 60 tablet 0   nitroGLYCERIN (NITROSTAT) 0.4 MG SL tablet Dissolve 1 tablet  under the tongue every 5 minutes as needed for chest pain. Maximum daily dose is 3 tablets. 25 tablet 0   pramipexole (MIRAPEX) 0.5 MG tablet Take 1 tablet by mouth at bedtime. 30 tablet 0   predniSONE (STERAPRED UNI-PAK 21 TAB) 10 MG (21) TBPK tablet 10mg  Tabs, 6 day taper. Use as directed 1 each 0   QUEtiapine (SEROQUEL) 50 MG tablet Take 1/2 to 1 tablet by mouth twice daily. 60 tablet 0   ranolazine (RANEXA) 500 MG 12 hr tablet Take 2 tablets (1,000 mg total) by mouth daily. 30 tablet 0   Selenium Sulfide 2.25 % SHAM Apply 1 application topically daily as needed. 1 Bottle 3   topiramate (TOPAMAX) 100 MG tablet Take 1 tablet by mouth daily. 30 tablet 0   venlafaxine XR (EFFEXOR-XR) 75 MG 24 hr capsule Take 3 capsules by mouth daily with breakfast. 90 capsule 0   No current facility-administered medications for this visit.    Allergies  Allergen Reactions   Fentanyl Swelling   Mirtazapine Swelling   Penicillins Anaphylaxis   Sulfa Antibiotics Hives   Ms Contin  [Morphine] Swelling    Family History  Problem Relation Age of Onset   Stroke Father    Cancer Father        Kidney    AAA (abdominal aortic aneurysm) Father    Depression Sister  Anxiety disorder Sister    Cancer Paternal Uncle        Lung   Depression Maternal Grandmother    Heart disease Paternal Grandmother    Stroke Paternal Grandmother    Diabetes Paternal Grandmother    Diabetes Paternal Grandfather     Social History   Socioeconomic History   Marital status: Married    Spouse name: Not on file   Number of children: Not on file   Years of education: Not on file   Highest education level: Master's degree (e.g., MA, MS, MEng, MEd, MSW, MBA)  Occupational History   Not on file  Tobacco Use   Smoking status: Every Day    Types: Cigarettes, E-cigarettes   Smokeless tobacco: Never  Vaping Use   Vaping status: Some Days   Substances: Nicotine, Flavoring  Substance and Sexual Activity   Alcohol use:  Yes    Alcohol/week: 0.0 standard drinks of alcohol    Comment: occasional   Drug use: No   Sexual activity: Yes  Other Topics Concern   Not on file  Social History Narrative   Not on file   Social Determinants of Health   Financial Resource Strain: Medium Risk (04/09/2023)   Overall Financial Resource Strain (CARDIA)    Difficulty of Paying Living Expenses: Somewhat hard  Food Insecurity: Food Insecurity Present (04/09/2023)   Hunger Vital Sign    Worried About Running Out of Food in the Last Year: Never true    Ran Out of Food in the Last Year: Sometimes true  Transportation Needs: No Transportation Needs (04/09/2023)   PRAPARE - Administrator, Civil Service (Medical): No    Lack of Transportation (Non-Medical): No  Physical Activity: Insufficiently Active (04/09/2023)   Exercise Vital Sign    Days of Exercise per Week: 1 day    Minutes of Exercise per Session: 20 min  Stress: Stress Concern Present (04/09/2023)   [employer] of Occupational Health - Occupational Stress Questionnaire    Feeling of Stress : Very much  Social Connections: Moderately Integrated (04/09/2023)   Social Connection and Isolation Panel [NHANES]    Frequency of Communication with Friends and Family: Once a week    Frequency of Social Gatherings with Friends and Family: Three times a week    Attends Religious Services: 1 to 4 times per year    Active Member of Clubs or Organizations: No    Attends Engineer, structural: Not on file    Marital Status: Married  Catering manager Violence: Not on file     Constitutional: Patient reports intermittent headaches.  Denies fever, malaise, fatigue, or abrupt weight changes.  HEENT: Denies eye pain, eye redness, ear pain, ringing in the ears, wax buildup, runny nose, nasal congestion, bloody nose, or sore throat. Respiratory: Denies difficulty breathing, shortness of breath, cough or sputum production.   Cardiovascular: Patient reports  intermittent chest pain.  Denies chest tightness, palpitations or swelling in the hands or feet.  Gastrointestinal: Denies abdominal pain, bloating, constipation, diarrhea or blood in the stool.  GU: Denies urgency, frequency, pain with urination, burning sensation, blood in urine, odor or discharge. Musculoskeletal: Denies decrease in range of motion, difficulty with gait, muscle pain or joint pain and swelling.  Skin: Denies redness, rashes, lesions or ulcercations.  Neurological: Patient reports restless legs.  Denies dizziness, difficulty with memory, difficulty with speech or problems with balance and coordination.  Psych: Patient has a history of anxiety and depression.  Denies  SI/HI.  No other specific complaints in a complete review of systems (except as listed in HPI above).     Objective:   Physical Exam   BP 116/78 (BP Location: Left Arm, Patient Position: Sitting, Cuff Size: Normal)   Pulse 81   Temp (!) 96.2 F (35.7 C) (Temporal)   Wt 220 lb (99.8 kg)   LMP 04/29/2023 (Exact Date)   SpO2 97%   BMI 35.51 kg/m   Wt Readings from Last 3 Encounters:  05/09/23 220 lb (99.8 kg)  07/20/22 226 lb (102.5 kg)  12/08/21 224 lb (101.6 kg)    General: Appears her stated age, obese, in NAD. Skin: Warm, dry and intact.  HEENT: Head: normal shape and size; Eyes: sclera white, no icterus, conjunctiva pink, PERRLA and EOMs intact;   Cardiovascular: Normal rate and rhythm. S1,S2 noted.  No murmur, rubs or gallops noted. No JVD or BLE edema.  Pulmonary/Chest: Normal effort and positive vesicular breath sounds. No respiratory distress. No wheezes, rales or ronchi noted.  Musculoskeletal: No difficulty with gait.  Neurological: Alert and oriented. Coordination normal.  Psychiatric: Mood and affect normal. Behavior is normal. Judgment and thought content normal.    BMET    Component Value Date/Time   NA 137 05/09/2023 0251   K 4.1 05/09/2023 0251   CL 103 05/09/2023 0251   CO2  22 05/09/2023 0251   GLUCOSE 116 (H) 05/09/2023 0251   BUN 18 05/09/2023 0251   CREATININE 1.03 (H) 05/09/2023 0251   CREATININE 1.30 (H) 12/08/2021 1457   CALCIUM 8.9 05/09/2023 0251   GFRNONAA >60 05/09/2023 0251    Lipid Panel     Component Value Date/Time   CHOL 176 08/26/2021 1509   TRIG 74 08/26/2021 1509   HDL 89 08/26/2021 1509   CHOLHDL 2.0 08/26/2021 1509   VLDL 19.6 07/15/2020 1611   LDLCALC 72 08/26/2021 1509    CBC    Component Value Date/Time   WBC 7.1 05/09/2023 0251   RBC 3.93 05/09/2023 0251   HGB 13.0 05/09/2023 0251   HCT 38.8 05/09/2023 0251   PLT 392 05/09/2023 0251   MCV 98.7 05/09/2023 0251   MCH 33.1 05/09/2023 0251   MCHC 33.5 05/09/2023 0251   RDW 13.0 05/09/2023 0251   LYMPHSABS 1.3 05/09/2023 0251   MONOABS 0.4 05/09/2023 0251   EOSABS 0.4 05/09/2023 0251   BASOSABS 0.1 05/09/2023 0251    Hgb A1C Lab Results  Component Value Date   HGBA1C 5.0 08/26/2021           Assessment & Plan:      Nicki Reaper, NP

## 2023-05-15 NOTE — Assessment & Plan Note (Signed)
Encouraged diet and exercise for weight loss ?

## 2023-05-15 NOTE — Assessment & Plan Note (Signed)
Ultrasound thyroid ordered

## 2023-05-15 NOTE — Assessment & Plan Note (Signed)
Stable on her current dose of venlafaxine, buspirone, Seroquel and hydroxyzine Support offered

## 2023-05-15 NOTE — Assessment & Plan Note (Signed)
Continue pramipexole

## 2023-05-16 ENCOUNTER — Other Ambulatory Visit: Payer: Self-pay | Admitting: Internal Medicine

## 2023-05-16 LAB — COMPLETE METABOLIC PANEL WITH GFR
AG Ratio: 1.4 (calc) (ref 1.0–2.5)
ALT: 12 U/L (ref 6–29)
AST: 12 U/L (ref 10–35)
Albumin: 4.1 g/dL (ref 3.6–5.1)
Alkaline phosphatase (APISO): 69 U/L (ref 31–125)
BUN/Creatinine Ratio: 13 (calc) (ref 6–22)
BUN: 15 mg/dL (ref 7–25)
CO2: 27 mmol/L (ref 20–32)
Calcium: 8.9 mg/dL (ref 8.6–10.2)
Chloride: 105 mmol/L (ref 98–110)
Creat: 1.15 mg/dL — ABNORMAL HIGH (ref 0.50–0.99)
Globulin: 2.9 g/dL (ref 1.9–3.7)
Glucose, Bld: 88 mg/dL (ref 65–139)
Potassium: 4 mmol/L (ref 3.5–5.3)
Sodium: 141 mmol/L (ref 135–146)
Total Bilirubin: 0.4 mg/dL (ref 0.2–1.2)
Total Protein: 7 g/dL (ref 6.1–8.1)
eGFR: 59 mL/min/{1.73_m2} — ABNORMAL LOW (ref >=60)

## 2023-05-16 LAB — CBC
HCT: 42.2 % (ref 35.0–45.0)
Hemoglobin: 13.7 g/dL (ref 11.7–15.5)
MCH: 32.8 pg (ref 27.0–33.0)
MCHC: 32.5 g/dL (ref 32.0–36.0)
MCV: 101 fL — ABNORMAL HIGH (ref 80.0–100.0)
MPV: 9.2 fL (ref 7.5–12.5)
Platelets: 443 10*3/uL — ABNORMAL HIGH (ref 140–400)
RBC: 4.18 10*6/uL (ref 3.80–5.10)
RDW: 12.3 % (ref 11.0–15.0)
WBC: 11.1 10*3/uL — ABNORMAL HIGH (ref 3.8–10.8)

## 2023-05-16 LAB — LIPID PANEL
Cholesterol: 205 mg/dL — ABNORMAL HIGH (ref <200)
HDL: 97 mg/dL (ref >=50)
LDL Cholesterol (Calc): 86 mg/dL
Non-HDL Cholesterol (Calc): 108 mg/dL (ref <130)
Total CHOL/HDL Ratio: 2.1 (calc) (ref <5.0)
Triglycerides: 127 mg/dL (ref <150)

## 2023-05-16 LAB — HEMOGLOBIN A1C
Hgb A1c MFr Bld: 5.3 %{Hb} (ref <5.7)
Mean Plasma Glucose: 105 mg/dL
eAG (mmol/L): 5.8 mmol/L

## 2023-05-16 MED ORDER — SEMAGLUTIDE-WEIGHT MANAGEMENT 0.25 MG/0.5ML ~~LOC~~ SOAJ: 0.2500 mg | SUBCUTANEOUS | 0 refills | Status: DC

## 2023-05-17 NOTE — Telephone Encounter (Signed)
Requested Prescriptions  Pending Prescriptions Disp Refills   nitroGLYCERIN (NITROSTAT) 0.4 MG SL tablet [Pharmacy Med Name: Nitroglycerin 0.4mg  Sublingual Tablet] 25 tablet 0    Sig: Place 1 tablet (0.4 mg total) under the tongue every 5 minutes as needed for chest pain. ** Maximum dose 3 tablets per 24 hour period.**     Cardiovascular:  Nitrates Passed - 05/16/2023 10:58 AM      Passed - Last BP in normal range    BP Readings from Last 1 Encounters:  05/15/23 116/78         Passed - Last Heart Rate in normal range    Pulse Readings from Last 1 Encounters:  05/15/23 81         Passed - Valid encounter within last 12 months    Recent Outpatient Visits           2 days ago Class 2 severe obesity due to excess calories with serious comorbidity and body mass index (BMI) of 35.0 to 35.9 in adult [facility])   [facility] [place], Salvadore Oxford, NP   1 year ago Cardiac microvascular disease   [facility] [place], Salvadore Oxford, NP   1 year ago Seizure-like activity [facility])   [place] [facility] [place], Salvadore Oxford, NP   1 year ago Encounter for general adult medical examination with abnormal findings   [place] [facility] [place], Salvadore Oxford, NP   2 years ago COVID-19   [facility] [place], Salvadore Oxford, NP       Future Appointments             In 6 months [place], Salvadore Oxford, NP [facility], [facility]

## 2023-05-21 ENCOUNTER — Other Ambulatory Visit: Payer: Medicare (Managed Care)

## 2023-05-23 ENCOUNTER — Telehealth: Payer: Self-pay | Admitting: Internal Medicine

## 2023-05-23 NOTE — Telephone Encounter (Signed)
Called LVM 05/23/2023 to schedule Annual Wellness Visit  Dana Miller; Care Guide Ambulatory Clinical Support [place] [facility] Direct Dial: [phone]

## 2023-06-22 ENCOUNTER — Other Ambulatory Visit: Payer: Self-pay | Admitting: Internal Medicine

## 2023-06-25 NOTE — Telephone Encounter (Signed)
Requested Prescriptions  Pending Prescriptions Disp Refills   Semaglutide-Weight Management (WEGOVY) 0.25 MG/0.5ML Alyson Ingles Med Name: QMVHQI 0.25mg /dose Pre-Filled Pen Solution for Injection] 2 mL 2    Sig: Inject 0.25 mg under the skin once weekly.     Endocrinology:  Diabetes - GLP-1 Receptor Agonists - semaglutide Failed - 06/22/2023  4:44 PM      Failed - Cr in normal range and within 360 days    Creat  Date Value Ref Range Status  05/15/2023 1.15 (H) 0.50 - 0.99 mg/dL Final         Passed - HBA1C in normal range and within 180 days    Hgb A1c MFr Bld  Date Value Ref Range Status  05/15/2023 5.3 <5.7 % of total Hgb Final    Comment:    For the purpose of screening for the presence of diabetes: . <5.7%       Consistent with the absence of diabetes 5.7-6.4%    Consistent with increased risk for diabetes             (prediabetes) > or =6.5%  Consistent with diabetes . This assay result is consistent with a decreased risk of diabetes. . Currently, no consensus exists regarding use of hemoglobin A1c for diagnosis of diabetes in children. . According to American Diabetes Association (ADA) guidelines, hemoglobin A1c <7.0% represents optimal control in non-pregnant diabetic patients. Different metrics may apply to specific patient populations.  Standards of Medical Care in Diabetes(ADA). Verna Czech - Valid encounter within last 6 months    Recent Outpatient Visits           1 month ago Class 2 severe obesity due to excess calories with serious comorbidity and body mass index (BMI) of 35.0 to 35.9 in adult [facility])   [place] [facility] [place], Salvadore Oxford, NP   1 year ago Cardiac microvascular disease   [facility] [place], Salvadore Oxford, NP   1 year ago Seizure-like activity [facility])   [place] [facility] [place], Salvadore Oxford, NP   1 year ago Encounter for general adult medical examination with abnormal findings    [place] [facility] [place], Salvadore Oxford, NP   2 years ago COVID-19   [facility] [place], Salvadore Oxford, NP       Future Appointments             In 4 months Baity, Salvadore Oxford, NP [place] [facility], [facility]

## 2023-07-22 ENCOUNTER — Other Ambulatory Visit: Payer: Self-pay | Admitting: Internal Medicine

## 2023-07-22 DIAGNOSIS — F419 Anxiety disorder, unspecified: Secondary | ICD-10-CM

## 2023-07-22 DIAGNOSIS — G43819 Other migraine, intractable, without status migrainosus: Secondary | ICD-10-CM

## 2023-07-22 DIAGNOSIS — R252 Cramp and spasm: Secondary | ICD-10-CM

## 2023-07-23 MED ORDER — RANOLAZINE ER 500 MG PO TB12: 1000.0000 mg | ORAL_TABLET | Freq: Every day | ORAL | 0 refills | Status: DC

## 2023-07-23 MED ORDER — ATORVASTATIN CALCIUM 20 MG PO TABS: 20.0000 mg | ORAL_TABLET | Freq: Every day | ORAL | 0 refills | Status: DC

## 2023-07-23 MED ORDER — HYDROXYZINE HCL 10 MG PO TABS: ORAL_TABLET | ORAL | 0 refills | Status: DC

## 2023-07-23 MED ORDER — PRAMIPEXOLE DIHYDROCHLORIDE 0.5 MG PO TABS: ORAL_TABLET | ORAL | 0 refills | Status: DC

## 2023-07-23 MED ORDER — TOPIRAMATE 100 MG PO TABS: ORAL_TABLET | ORAL | 0 refills | Status: DC

## 2023-07-23 MED ORDER — NITROGLYCERIN 0.4 MG SL SUBL: 0.4000 mg | SUBLINGUAL_TABLET | SUBLINGUAL | 0 refills | Status: DC | PRN

## 2023-07-23 MED ORDER — BUSPIRONE HCL 5 MG PO TABS: ORAL_TABLET | ORAL | 0 refills | Status: DC

## 2023-07-24 ENCOUNTER — Encounter: Payer: Self-pay | Admitting: Internal Medicine

## 2023-07-24 NOTE — Telephone Encounter (Signed)
Yes, okay for same dose

## 2023-08-01 NOTE — Progress Notes (Signed)
Wegovy approved cas number [id number]

## 2023-08-01 NOTE — Progress Notes (Signed)
Key: [id number])  form thumbnail Your information has been submitted to [employer]. [employer] is reviewing the PA request and you will receive an electronic response. You may check for the updated outcome later by reopening this request. The standard fax determination will also be sent to you directly.  If you have any questions about your PA submission, contact [employer] at [phone].

## 2023-08-03 MED ORDER — SEMAGLUTIDE-WEIGHT MANAGEMENT 0.5 MG/0.5ML ~~LOC~~ SOAJ: 0.5000 mg | SUBCUTANEOUS | 0 refills | Status: DC

## 2023-08-15 ENCOUNTER — Other Ambulatory Visit: Payer: Self-pay | Admitting: Internal Medicine

## 2023-08-20 NOTE — Telephone Encounter (Signed)
 Requested Prescriptions  Pending Prescriptions Disp Refills   venlafaxine  XR (EFFEXOR -XR) 75 MG 24 hr capsule [Pharmacy Med Name: Venlafaxine  Hydrochloride 75mg  Extended-Release Capsule] 90 capsule 0    Sig: Take 3 capsules by mouth daily with breakfast.     Psychiatry: Antidepressants - SNRI - desvenlafaxine & venlafaxine  Failed - 08/20/2023 12:50 PM      Failed - Cr in normal range and within 360 days    Creat  Date Value Ref Range Status  05/15/2023 1.15 (H) 0.50 - 0.99 mg/dL Final         Failed - Lipid Panel in normal range within the last 12 months    Cholesterol  Date Value Ref Range Status  05/15/2023 205 (H) <200 mg/dL Final   LDL Cholesterol (Calc)  Date Value Ref Range Status  05/15/2023 86 mg/dL (calc) Final    Comment:    Reference range: <100 . Desirable range <100 mg/dL for primary prevention;   <70 mg/dL for patients with CHD or diabetic patients  with > or = 2 CHD risk factors. SABRA LDL-C is now calculated using the Martin-Hopkins  calculation, which is a validated novel method providing  better accuracy than the Friedewald equation in the  estimation of LDL-C.  Gladis APPLETHWAITE et al. SANDREA. 7986;689(80): 2061-2068  (http://education.[employer].com/faq/FAQ164)    HDL  Date Value Ref Range Status  05/15/2023 97 > OR = 50 mg/dL Final   Triglycerides  Date Value Ref Range Status  05/15/2023 127 <150 mg/dL Final         Passed - Completed PHQ-2 or PHQ-9 in the last 360 days      Passed - Last BP in normal range    BP Readings from Last 1 Encounters:  05/15/23 116/78         Passed - Valid encounter within last 6 months    Recent Outpatient Visits           3 months ago Class 2 severe obesity due to excess calories with serious comorbidity and body mass index (BMI) of 35.0 to 35.9 in adult [facility])   [place] [facility] [place], Angeline ORN, NP   1 year ago Cardiac microvascular disease   [place] [facility] [place],  Angeline ORN, NP   1 year ago Seizure-like activity [facility])   [place] [facility] [place], Angeline ORN, NP   1 year ago Encounter for general adult medical examination with abnormal findings   [place] [facility] [place], Angeline ORN, NP   2 years ago COVID-19   [facility] [place], Angeline ORN, NP       Future Appointments             In 2 months Baity, Angeline ORN, NP [place] [facility], [facility]

## 2023-08-31 ENCOUNTER — Telehealth: Payer: Self-pay | Admitting: Internal Medicine

## 2023-08-31 NOTE — Telephone Encounter (Signed)
Called LVM 08/31/2023 to schedule AWV. Please schedule office or virtual visits.  Verlee Rossetti; Care Guide Ambulatory Clinical Support [place] [facility] Direct Dial: [phone]

## 2023-09-12 ENCOUNTER — Ambulatory Visit (INDEPENDENT_AMBULATORY_CARE_PROVIDER_SITE_OTHER): Payer: Medicare (Managed Care) | Admitting: Internal Medicine

## 2023-09-12 ENCOUNTER — Encounter: Payer: Self-pay | Admitting: Internal Medicine

## 2023-09-12 VITALS — BP 110/76 | Temp 98.2°F | Ht 66.0 in | Wt 212.0 lb

## 2023-09-12 DIAGNOSIS — B9689 Other specified bacterial agents as the cause of diseases classified elsewhere: Secondary | ICD-10-CM

## 2023-09-12 DIAGNOSIS — J40 Bronchitis, not specified as acute or chronic: Secondary | ICD-10-CM

## 2023-09-12 MED ORDER — DOXYCYCLINE HYCLATE 100 MG PO TABS: 100.0000 mg | ORAL_TABLET | Freq: Two times a day (BID) | ORAL | 0 refills | Status: DC

## 2023-09-12 MED ORDER — PROMETHAZINE-DM 6.25-15 MG/5ML PO SYRP: 5.0000 mL | ORAL_SOLUTION | Freq: Four times a day (QID) | ORAL | 0 refills | Status: DC | PRN

## 2023-09-12 MED ORDER — PREDNISONE 10 MG PO TABS
ORAL_TABLET | ORAL | 0 refills | Status: DC
Start: 2023-09-12 — End: 2023-11-20

## 2023-09-12 MED ORDER — ALBUTEROL SULFATE HFA 108 (90 BASE) MCG/ACT IN AERS
2.0000 | INHALATION_SPRAY | Freq: Four times a day (QID) | RESPIRATORY_TRACT | 0 refills | Status: DC | PRN
Start: 2023-09-12 — End: 2024-05-22

## 2023-09-12 NOTE — Progress Notes (Signed)
Subjective:    Patient ID: Dana Miller, female    DOB: 09/06/1975, 48 y.o.   MRN: [id number]  HPI  Discussed the use of AI scribe software for clinical note transcription with the patient, who gave verbal consent to proceed.  She presents with a persistent cough and shortness of breath.  She has been experiencing a persistent cough and significant shortness of breath for about three weeks. The cough is mostly dry with occasional sputum production that is not discolored. She feels extremely short of breath, especially when walking upstairs, and notes that her lungs feel affected.  She also reports runny nose, nasal congestion, ear pain, or sore throat. She experiences intermittent headaches and occasional sinus pressure, but these are not predominant symptoms. She has fever, chills, and body aches, with significant sweating and chills occurring at night.  She has been taking an over-the-counter [facility] brand medication for severe cold and congestion, which includes a fever reducer and a Mucinex-type component. She uses an inhaler, but it has not been sufficient to alleviate her symptoms.  She has not had sick contacts that she is aware of.      Review of Systems   Past Medical History:  Diagnosis Date   Anxiety    Cardiac microvascular disease    Chicken pox    Depression    Thyroid mass    right side    Current Outpatient Medications  Medication Sig Dispense Refill   amLODipine (NORVASC) 10 MG tablet Take 1 tablet (10 mg total) by mouth daily. 90 tablet 1   aspirin EC 81 MG tablet Take 1 tablet by mouth daily.     atorvastatin (LIPITOR) 20 MG tablet Take 1 tablet (20 mg total) by mouth daily. 30 tablet 0   busPIRone (BUSPAR) 5 MG tablet Take 1 tablet by mouth twice daily. 60 tablet 0   carvedilol (COREG) 25 MG tablet Take 1 tablet (25 mg total) by mouth 2 (two) times daily. 180 tablet 1   EPINEPHrine 0.3 mg/0.3 mL IJ SOAJ injection Inject 0.3 mLs (0.3 mg total) into the  muscle as needed. For Anaphylaxis 1 Device 0   fexofenadine (ALLEGRA) 180 MG tablet Take 1 tablet by mouth daily.     hydrOXYzine (ATARAX) 10 MG tablet Take 1 tablet by mouth twice daily as needed. 60 tablet 0   nitroGLYCERIN (NITROSTAT) 0.4 MG SL tablet Place 1 tablet (0.4 mg total) under the tongue every 5 (five) minutes as needed for chest pain. 25 tablet 0   pramipexole (MIRAPEX) 0.5 MG tablet Take 1 tablet by mouth at bedtime. 30 tablet 0   QUEtiapine (SEROQUEL) 50 MG tablet Take 1/2 to 1 tablet by mouth twice daily. 180 tablet 1   ranolazine (RANEXA) 500 MG 12 hr tablet Take 2 tablets (1,000 mg total) by mouth daily. 30 tablet 0   Selenium Sulfide 2.25 % SHAM Apply 1 application topically daily as needed. 1 Bottle 3   Semaglutide-Weight Management 0.5 MG/0.5ML SOAJ Inject 0.5 mg into the skin once a week. 6 mL 0   topiramate (TOPAMAX) 100 MG tablet Take 1 tablet by mouth daily. 30 tablet 0   venlafaxine XR (EFFEXOR-XR) 75 MG 24 hr capsule Take 3 capsules by mouth daily with breakfast. 270 capsule 0   No current facility-administered medications for this visit.    Allergies  Allergen Reactions   Fentanyl Swelling   Mirtazapine Swelling   Penicillins Anaphylaxis   Sulfa Antibiotics Hives   Ms Contin  [Morphine] Swelling  Family History  Problem Relation Age of Onset   Stroke Father    Cancer Father        Kidney    AAA (abdominal aortic aneurysm) Father    Depression Sister    Anxiety disorder Sister    Cancer Paternal Uncle        Lung   Depression Maternal Grandmother    Heart disease Paternal Grandmother    Stroke Paternal Grandmother    Diabetes Paternal Grandmother    Diabetes Paternal Grandfather     Social History   Socioeconomic History   Marital status: Married    Spouse name: Not on file   Number of children: Not on file   Years of education: Not on file   Highest education level: Master's degree (e.g., MA, MS, MEng, MEd, MSW, MBA)  Occupational  History   Not on file  Tobacco Use   Smoking status: Every Day    Types: Cigarettes, E-cigarettes   Smokeless tobacco: Never  Vaping Use   Vaping status: Some Days   Substances: Nicotine, Flavoring  Substance and Sexual Activity   Alcohol use: Yes    Alcohol/week: 0.0 standard drinks of alcohol    Comment: occasional   Drug use: No   Sexual activity: Yes  Other Topics Concern   Not on file  Social History Narrative   Not on file   Social Drivers of Health   Financial Resource Strain: Medium Risk (04/09/2023)   Overall Financial Resource Strain (CARDIA)    Difficulty of Paying Living Expenses: Somewhat hard  Food Insecurity: Food Insecurity Present (04/09/2023)   Hunger Vital Sign    Worried About Running Out of Food in the Last Year: Never true    Ran Out of Food in the Last Year: Sometimes true  Transportation Needs: No Transportation Needs (04/09/2023)   PRAPARE - Administrator, Civil Service (Medical): No    Lack of Transportation (Non-Medical): No  Physical Activity: Insufficiently Active (04/09/2023)   Exercise Vital Sign    Days of Exercise per Week: 1 day    Minutes of Exercise per Session: 20 min  Stress: Stress Concern Present (04/09/2023)   [employer] of Occupational Health - Occupational Stress Questionnaire    Feeling of Stress : Very much  Social Connections: Moderately Integrated (04/09/2023)   Social Connection and Isolation Panel [NHANES]    Frequency of Communication with Friends and Family: Once a week    Frequency of Social Gatherings with Friends and Family: Three times a week    Attends Religious Services: 1 to 4 times per year    Active Member of Clubs or Organizations: No    Attends Engineer, structural: Not on file    Marital Status: Married  Catering manager Violence: Not on file     Constitutional: Pt reports headache, chills. Denies fever, malaise, fatigue, or abrupt weight changes.  HEENT: Pt reports sinus  pressure, runny nose and nasal congestion. Denies eye pain, eye redness, ear pain, ringing in the ears, wax buildup, runny nose, bloody nose. Respiratory: Pt reports cough and shortness of breath. Denies difficulty breathing.   Cardiovascular: Denies chest pain, chest tightness, palpitations or swelling in the hands or feet.  Gastrointestinal: Denies abdominal pain, bloating, constipation, diarrhea or blood in the stool.  Musculoskeletal: Denies decrease in range of motion, difficulty with gait, muscle pain or joint pain and swelling.  Skin: Denies redness, rashes, lesions or ulcercations.  Neurological: Denies dizziness, difficulty with memory, difficulty  with speech or problems with balance and coordination.    No other specific complaints in a complete review of systems (except as listed in HPI above).      Objective:   Physical Exam  BP 110/76   Temp 98.2 F (36.8 C)   Ht 5\' 6"  (1.676 m)   Wt 212 lb (96.2 kg)   BMI 34.22 kg/m   Wt Readings from Last 3 Encounters:  05/15/23 220 lb (99.8 kg)  05/09/23 220 lb (99.8 kg)  07/20/22 226 lb (102.5 kg)    General: Appears her stated age, appears unwell but in NAD. Skin: Warm, dry and intact. No rashes noted. HEENT: Head: normal shape and size, no sinus tenderness noted; Eyes: sclera white, no icterus, conjunctiva pink, PERRLA and EOMs intact;  Nose: mucosa pink and moist, septum midline; Throat/Mouth: Teeth present, mucosa erythematous and moist, + PND, no exudate, lesions or ulcerations noted.  Neck: No adenopathy noted. Cardiovascular: Normal rate and rhythm.  Pulmonary/Chest: Normal effort and positive vesicular breath sounds with bilateral expiratory wheezing and scattered rhonchi throughout. No respiratory distress. No rales noted.  Neurological: Alert and oriented.   BMET    Component Value Date/Time   NA 141 05/15/2023 1506   K 4.0 05/15/2023 1506   CL 105 05/15/2023 1506   CO2 27 05/15/2023 1506   GLUCOSE 88 05/15/2023  1506   BUN 15 05/15/2023 1506   CREATININE 1.15 (H) 05/15/2023 1506   CALCIUM 8.9 05/15/2023 1506   GFRNONAA >60 05/09/2023 0251    Lipid Panel     Component Value Date/Time   CHOL 205 (H) 05/15/2023 1506   TRIG 127 05/15/2023 1506   HDL 97 05/15/2023 1506   CHOLHDL 2.1 05/15/2023 1506   VLDL 19.6 07/15/2020 1611   LDLCALC 86 05/15/2023 1506    CBC    Component Value Date/Time   WBC 11.1 (H) 05/15/2023 1506   RBC 4.18 05/15/2023 1506   HGB 13.7 05/15/2023 1506   HCT 42.2 05/15/2023 1506   PLT 443 (H) 05/15/2023 1506   MCV 101.0 (H) 05/15/2023 1506   MCH 32.8 05/15/2023 1506   MCHC 32.5 05/15/2023 1506   RDW 12.3 05/15/2023 1506   LYMPHSABS 1.3 05/09/2023 0251   MONOABS 0.4 05/09/2023 0251   EOSABS 0.4 05/09/2023 0251   BASOSABS 0.1 05/09/2023 0251    Hgb A1C Lab Results  Component Value Date   HGBA1C 5.3 05/15/2023            Assessment & Plan:  Assessment and Plan    Bronchitis Persistent cough, shortness of breath, and fever for three weeks. No productive cough or other upper respiratory symptoms. Lung sounds consistent with bronchitis. -Prescribe Doxycycline 100mg  twice daily for 10 days. -Prescribed prednisone taper x 6 days -Prescribe Albuterol inhaler, 1-2 puffs every 4-6 hours as needed. -Prescribe Promethazine DM cough syrup, up to 4 times daily as needed, primarily for nighttime use. -Advise patient not to wait so long before seeking medical attention in the future.        RTC in 3 months for annual exam Nicki Reaper, NP

## 2023-09-12 NOTE — Patient Instructions (Signed)
Acute Bronchitis, Adult  Acute bronchitis is when air tubes in the lungs (bronchi) suddenly get swollen. The condition can make it hard for you to breathe. In adults, acute bronchitis usually goes away within 2 weeks. A cough caused by bronchitis may last up to 3 weeks. Smoking, allergies, and asthma can make the condition worse. What are the causes? Germs that cause cold and flu (viruses). The most common cause of this condition is the virus that causes the common cold. Bacteria. Substances that bother (irritate) the lungs, including: Smoke from cigarettes and other types of tobacco. Dust and pollen. Fumes from chemicals, gases, or burned fuel. Indoor or outdoor air pollution. What increases the risk? A weak body's defense system. This is also called the immune system. Any condition that affects your lungs and breathing, such as asthma. What are the signs or symptoms? A cough. Coughing up clear, yellow, or green mucus. Making high-pitched whistling sounds when you breathe, most often when you breathe out (wheezing). Runny or stuffy nose. Having too much mucus in your lungs (chest congestion). Shortness of breath. Body aches. A sore throat. How is this treated? Acute bronchitis may go away over time without treatment. Your doctor may tell you to: Drink more fluids. This will help thin your mucus so it is easier to cough up. Use a device that gets medicine into your lungs (inhaler). Use a vaporizer or a humidifier. These are machines that add water to the air. This helps with coughing and poor breathing. Take a medicine that thins mucus and helps clear it from your lungs. Take a medicine that prevents or stops coughing. It is not common to take an antibiotic medicine for this condition. Follow these instructions at home:  Take over-the-counter and prescription medicines only as told by your doctor. Use an inhaler, vaporizer, or humidifier as told by your doctor. Take two teaspoons  (10 mL) of honey at bedtime. This helps lessen your coughing at night. Drink enough fluid to keep your pee (urine) pale yellow. Do not smoke or use any products that contain nicotine or tobacco. If you need help quitting, ask your doctor. Get a lot of rest. Return to your normal activities when your doctor says that it is safe. Keep all follow-up visits. How is this prevented?  Wash your hands often with soap and water for at least 20 seconds. If you cannot use soap and water, use hand sanitizer. Avoid contact with people who have cold symptoms. Try not to touch your mouth, nose, or eyes with your hands. Avoid breathing in smoke or chemical fumes. Make sure to get the flu shot every year. Contact a doctor if: Your symptoms do not get better in 2 weeks. You have trouble coughing up the mucus. Your cough keeps you awake at night. You have a fever. Get help right away if: You cough up blood. You have chest pain. You have very bad shortness of breath. You faint or keep feeling like you are going to faint. You have a very bad headache. Your fever or chills get worse. These symptoms may be an emergency. Get help right away. Call your local emergency services (911 in the [place] Do not wait to see if the symptoms will go away. Do not drive yourself to the hospital. Summary Acute bronchitis is when air tubes in the lungs (bronchi) suddenly get swollen. In adults, acute bronchitis usually goes away within 2 weeks. Drink more fluids. This will help thin your mucus so it is easier  to cough up. Take over-the-counter and prescription medicines only as told by your doctor. Contact a doctor if your symptoms do not improve after 2 weeks of treatment. This information is not intended to replace advice given to you by your health care provider. Make sure you discuss any questions you have with your health care provider. Document Revised: 12/01/2020 Document Reviewed: 12/01/2020 Elsevier Patient  Education  2024 [employer].

## 2023-10-03 ENCOUNTER — Encounter: Payer: Self-pay | Admitting: Internal Medicine

## 2023-10-04 MED ORDER — ONDANSETRON 4 MG PO TBDP
4.0000 mg | ORAL_TABLET | Freq: Three times a day (TID) | ORAL | 0 refills | Status: DC | PRN
Start: 2023-10-04 — End: 2023-11-20

## 2023-10-14 ENCOUNTER — Other Ambulatory Visit: Payer: Self-pay | Admitting: Internal Medicine

## 2023-10-15 NOTE — Telephone Encounter (Signed)
 Requested medication (s) are due for refill today: Yes  Requested medication (s) are on the active medication list: Yes  Last refill:  05/15/23  Future visit scheduled: Yes  Notes to clinic:  Unable to refill per protocol, cannot delegate.      Requested Prescriptions  Pending Prescriptions Disp Refills   QUEtiapine (SEROQUEL) 50 MG tablet [Pharmacy Med Name: Quetiapine Fumarate 50mg  Tablet] 180 tablet 0    Sig: Take 1/2 to 1 tablet by mouth twice daily.     Not Delegated - Psychiatry:  Antipsychotics - Second Generation (Atypical) - quetiapine Failed - 10/15/2023  5:28 PM      Failed - This refill cannot be delegated      Failed - TSH in normal range and within 360 days    TSH  Date Value Ref Range Status  12/08/2021 2.27 mIU/L Final    Comment:              Reference Range .           > or = 20 Years  0.40-4.50 .                Pregnancy Ranges           First trimester    0.26-2.66           Second trimester   0.55-2.73           Third trimester    0.43-2.91          Failed - Lipid Panel in normal range within the last 12 months    Cholesterol  Date Value Ref Range Status  05/15/2023 205 (H) <200 mg/dL Final   LDL Cholesterol (Calc)  Date Value Ref Range Status  05/15/2023 86 mg/dL (calc) Final    Comment:    Reference range: <100 . Desirable range <100 mg/dL for primary prevention;   <70 mg/dL for patients with CHD or diabetic patients  with > or = 2 CHD risk factors. Marland Kitchen LDL-C is now calculated using the Martin-Hopkins  calculation, which is a validated novel method providing  better accuracy than the Friedewald equation in the  estimation of LDL-C.  Horald Pollen et al. Lenox Ahr. 7829;562(13): 2061-2068  (http://education.[facility].com/faq/FAQ164)    HDL  Date Value Ref Range Status  05/15/2023 97 > OR = 50 mg/dL Final   Triglycerides  Date Value Ref Range Status  05/15/2023 127 <150 mg/dL Final         Failed - CMP within normal limits and  completed in the last 12 months    Albumin  Date Value Ref Range Status  07/15/2020 4.4 3.5 - 5.2 g/dL Final   Alkaline Phosphatase  Date Value Ref Range Status  07/15/2020 53 39 - 117 U/L Final   Alkaline phosphatase (APISO)  Date Value Ref Range Status  05/15/2023 69 31 - 125 U/L Final   ALT  Date Value Ref Range Status  05/15/2023 12 6 - 29 U/L Final   AST  Date Value Ref Range Status  05/15/2023 12 10 - 35 U/L Final   BUN  Date Value Ref Range Status  05/15/2023 15 7 - 25 mg/dL Final   Calcium  Date Value Ref Range Status  05/15/2023 8.9 8.6 - 10.2 mg/dL Final   CO2  Date Value Ref Range Status  05/15/2023 27 20 - 32 mmol/L Final   Creat  Date Value Ref Range Status  05/15/2023 1.15 (H) 0.50 - 0.99 mg/dL Final   Glucose,  Bld  Date Value Ref Range Status  05/15/2023 88 65 - 139 mg/dL Final    Comment:    .        Non-fasting reference interval .    Potassium  Date Value Ref Range Status  05/15/2023 4.0 3.5 - 5.3 mmol/L Final   Sodium  Date Value Ref Range Status  05/15/2023 141 135 - 146 mmol/L Final   Total Bilirubin  Date Value Ref Range Status  05/15/2023 0.4 0.2 - 1.2 mg/dL Final   Protein, UA  Date Value Ref Range Status  11/23/2017 neg  Final   Total Protein  Date Value Ref Range Status  05/15/2023 7.0 6.1 - 8.1 g/dL Final   GFR  Date Value Ref Range Status  07/15/2020 49.68 (L) >60.00 mL/min Final    Comment:    Calculated using the CKD-EPI Creatinine Equation (2021)   eGFR  Date Value Ref Range Status  05/15/2023 59 (L) > OR = 60 mL/min/1.78m2 Final   GFR, Estimated  Date Value Ref Range Status  05/09/2023 >60 >60 mL/min Final    Comment:    (NOTE) Calculated using the CKD-EPI Creatinine Equation (2021)          Passed - Completed PHQ-2 or PHQ-9 in the last 360 days      Passed - Last BP in normal range    BP Readings from Last 1 Encounters:  09/12/23 110/76         Passed - Last Heart Rate in normal range     Pulse Readings from Last 1 Encounters:  05/15/23 81         Passed - Valid encounter within last 6 months    Recent Outpatient Visits           1 month ago Acute bacterial bronchitis   [place] [facility] [place], Salvadore Oxford, NP   5 months ago Class 2 severe obesity due to excess calories with serious comorbidity and body mass index (BMI) of 35.0 to 35.9 in adult [facility])   [place] [facility] [place], Salvadore Oxford, NP   1 year ago Cardiac microvascular disease   [place] [facility] [place], Salvadore Oxford, NP   1 year ago Seizure-like activity [facility])   [place] [facility], Salvadore Oxford, NP   2 years ago Encounter for general adult medical examination with abnormal findings   [place] [facility] [place], Salvadore Oxford, NP              Passed - CBC within normal limits and completed in the last 12 months    WBC  Date Value Ref Range Status  05/15/2023 11.1 (H) 3.8 - 10.8 Thousand/uL Final   RBC  Date Value Ref Range Status  05/15/2023 4.18 3.80 - 5.10 Million/uL Final   Hemoglobin  Date Value Ref Range Status  05/15/2023 13.7 11.7 - 15.5 g/dL Final   HCT  Date Value Ref Range Status  05/15/2023 42.2 35.0 - 45.0 % Final   MCHC  Date Value Ref Range Status  05/15/2023 32.5 32.0 - 36.0 g/dL Final    Comment:    For adults, a slight decrease in the calculated MCHC value (in the range of 30 to 32 g/dL) is most likely not clinically significant; however, it should be interpreted with caution in correlation with other red cell parameters and the patient's clinical condition.    [facility]  Date Value Ref  Range Status  05/15/2023 32.8 27.0 - 33.0 pg Final   MCV  Date Value Ref Range Status  05/15/2023 101.0 (H) 80.0 - 100.0 fL Final   No results found for: "PLTCOUNTKUC", "LABPLAT", "POCPLA" RDW  Date Value Ref Range Status  05/15/2023 12.3 11.0 - 15.0 % Final

## 2023-11-13 ENCOUNTER — Encounter: Payer: Medicare (Managed Care) | Admitting: Internal Medicine

## 2023-11-13 NOTE — Progress Notes (Deleted)
 Subjective:    Patient ID: Dana Miller, female    DOB: 1976/07/06, 48 y.o.   MRN: [id number]  HPI  Patient presents the clinic today for annual exam.   Flu: 05/2021 Tetanus: 12/2014 COVID: Never Pap smear: 08/2021, abnormal, repeat 3 years Mammogram: 08/2021 Colon screening: Never Vision screening: annually Dentist: biannually  Diet: She does eat meat.  She consumes fruits and vegetables.  She does eat some fried foods. Exercise: Walking  Review of Systems     Past Medical History:  Diagnosis Date   Anxiety    Cardiac microvascular disease    Chicken pox    Depression    Thyroid mass    right side    Current Outpatient Medications  Medication Sig Dispense Refill   albuterol (VENTOLIN HFA) 108 (90 Base) MCG/ACT inhaler Inhale 2 puffs into the lungs every 6 (six) hours as needed for wheezing or shortness of breath. 8 g 0   amLODipine (NORVASC) 10 MG tablet Take 1 tablet (10 mg total) by mouth daily. 90 tablet 1   aspirin EC 81 MG tablet Take 1 tablet by mouth daily.     atorvastatin (LIPITOR) 20 MG tablet Take 1 tablet (20 mg total) by mouth daily. 30 tablet 0   busPIRone (BUSPAR) 5 MG tablet Take 1 tablet by mouth twice daily. 60 tablet 0   carvedilol (COREG) 25 MG tablet Take 1 tablet (25 mg total) by mouth 2 (two) times daily. 180 tablet 1   doxycycline (VIBRA-TABS) 100 MG tablet Take 1 tablet (100 mg total) by mouth 2 (two) times daily. 20 tablet 0   EPINEPHrine 0.3 mg/0.3 mL IJ SOAJ injection Inject 0.3 mLs (0.3 mg total) into the muscle as needed. For Anaphylaxis 1 Device 0   fexofenadine (ALLEGRA) 180 MG tablet Take 1 tablet by mouth daily.     hydrOXYzine (ATARAX) 10 MG tablet Take 1 tablet by mouth twice daily as needed. 60 tablet 0   nitroGLYCERIN (NITROSTAT) 0.4 MG SL tablet Place 1 tablet (0.4 mg total) under the tongue every 5 (five) minutes as needed for chest pain. 25 tablet 0   ondansetron (ZOFRAN-ODT) 4 MG disintegrating tablet Take 1 tablet (4 mg total)  by mouth every 8 (eight) hours as needed for nausea or vomiting. 30 tablet 0   pramipexole (MIRAPEX) 0.5 MG tablet Take 1 tablet by mouth at bedtime. 30 tablet 0   predniSONE (DELTASONE) 10 MG tablet Take 6 tabs on day 1, 5 tabs on day 2, 4 tabs on day 3, 3 tabs on day 4, 2 tabs on day 5, 1 tab on day 6 21 tablet 0   promethazine-dextromethorphan (PROMETHAZINE-DM) 6.25-15 MG/5ML syrup Take 5 mLs by mouth 4 (four) times daily as needed. 118 mL 0   QUEtiapine (SEROQUEL) 50 MG tablet Take 1/2 to 1 tablet by mouth twice daily. 180 tablet 0   ranolazine (RANEXA) 500 MG 12 hr tablet Take 2 tablets (1,000 mg total) by mouth daily. 30 tablet 0   Selenium Sulfide 2.25 % SHAM Apply 1 application topically daily as needed. 1 Bottle 3   Semaglutide-Weight Management 0.5 MG/0.5ML SOAJ Inject 0.5 mg into the skin once a week. 6 mL 0   topiramate (TOPAMAX) 100 MG tablet Take 1 tablet by mouth daily. 30 tablet 0   venlafaxine XR (EFFEXOR-XR) 75 MG 24 hr capsule Take 3 capsules by mouth daily with breakfast. 270 capsule 0   No current facility-administered medications for this visit.    Allergies  Allergen Reactions   Fentanyl Swelling   Mirtazapine Swelling   Penicillins Anaphylaxis   Sulfa Antibiotics Hives   Ms Contin  [Morphine] Swelling    Family History  Problem Relation Age of Onset   Stroke Father    Cancer Father        Kidney    AAA (abdominal aortic aneurysm) Father    Depression Sister    Anxiety disorder Sister    Cancer Paternal Uncle        Lung   Depression Maternal Grandmother    Heart disease Paternal Grandmother    Stroke Paternal Grandmother    Diabetes Paternal Grandmother    Diabetes Paternal Grandfather     Social History   Socioeconomic History   Marital status: Married    Spouse name: Not on file   Number of children: Not on file   Years of education: Not on file   Highest education level: Master's degree (e.g., MA, MS, MEng, MEd, MSW, MBA)  Occupational  History   Not on file  Tobacco Use   Smoking status: Every Day    Types: Cigarettes, E-cigarettes   Smokeless tobacco: Never  Vaping Use   Vaping status: Some Days   Substances: Nicotine, Flavoring  Substance and Sexual Activity   Alcohol use: Yes    Alcohol/week: 0.0 standard drinks of alcohol    Comment: occasional   Drug use: No   Sexual activity: Yes  Other Topics Concern   Not on file  Social History Narrative   Not on file   Social Drivers of Health   Financial Resource Strain: Medium Risk (04/09/2023)   Overall Financial Resource Strain (CARDIA)    Difficulty of Paying Living Expenses: Somewhat hard  Food Insecurity: Food Insecurity Present (04/09/2023)   Hunger Vital Sign    Worried About Running Out of Food in the Last Year: Never true    Ran Out of Food in the Last Year: Sometimes true  Transportation Needs: No Transportation Needs (04/09/2023)   PRAPARE - Administrator, Civil Service (Medical): No    Lack of Transportation (Non-Medical): No  Physical Activity: Insufficiently Active (04/09/2023)   Exercise Vital Sign    Days of Exercise per Week: 1 day    Minutes of Exercise per Session: 20 min  Stress: Stress Concern Present (04/09/2023)   [employer] of Occupational Health - Occupational Stress Questionnaire    Feeling of Stress : Very much  Social Connections: Moderately Integrated (04/09/2023)   Social Connection and Isolation Panel [NHANES]    Frequency of Communication with Friends and Family: Once a week    Frequency of Social Gatherings with Friends and Family: Three times a week    Attends Religious Services: 1 to 4 times per year    Active Member of Clubs or Organizations: No    Attends Engineer, structural: Not on file    Marital Status: Married  Catering manager Violence: Not on file     Constitutional: Patient reports intermittent headaches.  Denies fever, malaise, fatigue, or abrupt weight changes.  HEENT: Denies eye  pain, eye redness, ear pain, ringing in the ears, wax buildup, runny nose, nasal congestion, bloody nose, or sore throat. Respiratory: Denies difficulty breathing, shortness of breath, cough or sputum production.   Cardiovascular: Patient reports intermittent chest pain.  Denies chest tightness, palpitations or swelling in the hands or feet.  Gastrointestinal: Denies abdominal pain, bloating, constipation, diarrhea or blood in the stool.  GU: Denies urgency,  frequency, pain with urination, burning sensation, blood in urine, odor or discharge. Musculoskeletal: Denies decrease in range of motion, difficulty with gait, muscle pain or joint pain and swelling.  Skin: Denies redness, rashes, lesions or ulcercations.  Neurological: Patient reports restless legs.  Denies dizziness, difficulty with memory, difficulty with speech or problems with balance and coordination.  Psych: Patient has a history of anxiety and depression.  Denies SI/HI.  No other specific complaints in a complete review of systems (except as listed in HPI above).  Objective:   Physical Exam  There were no vitals taken for this visit.  Wt Readings from Last 3 Encounters:  09/12/23 212 lb (96.2 kg)  05/15/23 220 lb (99.8 kg)  05/09/23 220 lb (99.8 kg)    General: Appears her stated age, obese, in NAD. Skin: Warm, dry and intact.  HEENT: Head: normal shape and size; Eyes: sclera white and EOMs intact;  Neck:  Neck supple, trachea midline. No masses, lumps or thyromegaly present.  Cardiovascular: Normal rate and rhythm. S1,S2 noted.  No murmur, rubs or gallops noted. No JVD or BLE edema.  Pulmonary/Chest: Normal effort and positive vesicular breath sounds. No respiratory distress. No wheezes, rales or ronchi noted.  Abdomen: Soft and nontender. Normal bowel sounds.  Pelvic: Normal female anatomy.  Cervix without lesion or mass.  No CMT.  Adnexa nonpalpable. Musculoskeletal: Strength 5/5 BUE/BLE.  No difficulty with gait.   Neurological: Alert and oriented. Cranial nerves II-XII grossly intact. Coordination normal.  Psychiatric: Mood and affect normal. Behavior is normal. Judgment and thought content normal.   BMET    Component Value Date/Time   NA 141 05/15/2023 1506   K 4.0 05/15/2023 1506   CL 105 05/15/2023 1506   CO2 27 05/15/2023 1506   GLUCOSE 88 05/15/2023 1506   BUN 15 05/15/2023 1506   CREATININE 1.15 (H) 05/15/2023 1506   CALCIUM 8.9 05/15/2023 1506   GFRNONAA >60 05/09/2023 0251    Lipid Panel     Component Value Date/Time   CHOL 205 (H) 05/15/2023 1506   TRIG 127 05/15/2023 1506   HDL 97 05/15/2023 1506   CHOLHDL 2.1 05/15/2023 1506   VLDL 19.6 07/15/2020 1611   LDLCALC 86 05/15/2023 1506    CBC    Component Value Date/Time   WBC 11.1 (H) 05/15/2023 1506   RBC 4.18 05/15/2023 1506   HGB 13.7 05/15/2023 1506   HCT 42.2 05/15/2023 1506   PLT 443 (H) 05/15/2023 1506   MCV 101.0 (H) 05/15/2023 1506   MCH 32.8 05/15/2023 1506   MCHC 32.5 05/15/2023 1506   RDW 12.3 05/15/2023 1506   LYMPHSABS 1.3 05/09/2023 0251   MONOABS 0.4 05/09/2023 0251   EOSABS 0.4 05/09/2023 0251   BASOSABS 0.1 05/09/2023 0251    Hgb A1C Lab Results  Component Value Date   HGBA1C 5.3 05/15/2023            Assessment & Plan:   Preventative Health Maintenance:  Encouraged her to get a flu shot in the fall Tetanus UTD Encouraged her to get her COVID-vaccine  Pap smear UTD Mammogram ordered, she will call GI breast center to schedule Referral to GI for screening colonoscopy Encouraged her to consume a balanced diet and exercise regimen Advised her to see an eye doctor and dentist annually We will check CBC, c-Met, lipid, A1c today  RTC in 6 months, follow-up chronic conditions  Nicki Reaper, NP

## 2023-11-20 ENCOUNTER — Ambulatory Visit (INDEPENDENT_AMBULATORY_CARE_PROVIDER_SITE_OTHER): Payer: Medicare (Managed Care) | Admitting: Internal Medicine

## 2023-11-20 VITALS — BP 108/72 | Ht 66.0 in | Wt 210.4 lb

## 2023-11-20 DIAGNOSIS — Z6833 Body mass index (BMI) 33.0-33.9, adult: Secondary | ICD-10-CM

## 2023-11-20 DIAGNOSIS — E6609 Other obesity due to excess calories: Secondary | ICD-10-CM

## 2023-11-20 DIAGNOSIS — R739 Hyperglycemia, unspecified: Secondary | ICD-10-CM | POA: Diagnosis not present

## 2023-11-20 DIAGNOSIS — E66812 Obesity, class 2: Secondary | ICD-10-CM | POA: Insufficient documentation

## 2023-11-20 DIAGNOSIS — Z136 Encounter for screening for cardiovascular disorders: Secondary | ICD-10-CM | POA: Diagnosis not present

## 2023-11-20 DIAGNOSIS — Z1231 Encounter for screening mammogram for malignant neoplasm of breast: Secondary | ICD-10-CM

## 2023-11-20 DIAGNOSIS — Z0001 Encounter for general adult medical examination with abnormal findings: Secondary | ICD-10-CM

## 2023-11-20 DIAGNOSIS — E66811 Obesity, class 1: Secondary | ICD-10-CM

## 2023-11-20 DIAGNOSIS — F322 Major depressive disorder, single episode, severe without psychotic features: Secondary | ICD-10-CM | POA: Insufficient documentation

## 2023-11-20 DIAGNOSIS — Z6835 Body mass index (BMI) 35.0-35.9, adult: Secondary | ICD-10-CM | POA: Insufficient documentation

## 2023-11-20 MED ORDER — ONDANSETRON 4 MG PO TBDP: 4.0000 mg | ORAL_TABLET | Freq: Three times a day (TID) | ORAL | 0 refills | Status: AC | PRN

## 2023-11-20 MED ORDER — EPINEPHRINE 0.3 MG/0.3ML IJ SOAJ
0.3000 mg | INTRAMUSCULAR | 0 refills | Status: AC | PRN
Start: 2023-11-20 — End: ?

## 2023-11-20 NOTE — Assessment & Plan Note (Signed)
 Encouraged diet and exercise for weight loss ?

## 2023-11-20 NOTE — Patient Instructions (Signed)

## 2023-11-20 NOTE — Progress Notes (Signed)
 Subjective:    Patient ID: Dana Miller, female    DOB: 1976-06-02, 48 y.o.   MRN: [id number]  HPI  Patient presents the clinic today for annual exam.   Flu: 05/2021 Tetanus: 12/2014 COVID: Never Pap smear: 08/2021, abnormal, repeat 3 years Mammogram: 08/2021 Colon screening: Never Vision screening: annually Dentist: biannually  Diet: She does eat meat.  She consumes fruits and vegetables.  She does eat some fried foods. Exercise: Walking  Review of Systems     Past Medical History:  Diagnosis Date   Anxiety    Cardiac microvascular disease    Chicken pox    Depression    Thyroid mass    right side    Current Outpatient Medications  Medication Sig Dispense Refill   albuterol (VENTOLIN HFA) 108 (90 Base) MCG/ACT inhaler Inhale 2 puffs into the lungs every 6 (six) hours as needed for wheezing or shortness of breath. 8 g 0   amLODipine (NORVASC) 10 MG tablet Take 1 tablet (10 mg total) by mouth daily. 90 tablet 1   aspirin EC 81 MG tablet Take 1 tablet by mouth daily.     atorvastatin (LIPITOR) 20 MG tablet Take 1 tablet (20 mg total) by mouth daily. 30 tablet 0   busPIRone (BUSPAR) 5 MG tablet Take 1 tablet by mouth twice daily. 60 tablet 0   carvedilol (COREG) 25 MG tablet Take 1 tablet (25 mg total) by mouth 2 (two) times daily. 180 tablet 1   doxycycline (VIBRA-TABS) 100 MG tablet Take 1 tablet (100 mg total) by mouth 2 (two) times daily. 20 tablet 0   EPINEPHrine 0.3 mg/0.3 mL IJ SOAJ injection Inject 0.3 mLs (0.3 mg total) into the muscle as needed. For Anaphylaxis 1 Device 0   fexofenadine (ALLEGRA) 180 MG tablet Take 1 tablet by mouth daily.     hydrOXYzine (ATARAX) 10 MG tablet Take 1 tablet by mouth twice daily as needed. 60 tablet 0   nitroGLYCERIN (NITROSTAT) 0.4 MG SL tablet Place 1 tablet (0.4 mg total) under the tongue every 5 (five) minutes as needed for chest pain. 25 tablet 0   ondansetron (ZOFRAN-ODT) 4 MG disintegrating tablet Take 1 tablet (4 mg total)  by mouth every 8 (eight) hours as needed for nausea or vomiting. 30 tablet 0   pramipexole (MIRAPEX) 0.5 MG tablet Take 1 tablet by mouth at bedtime. 30 tablet 0   predniSONE (DELTASONE) 10 MG tablet Take 6 tabs on day 1, 5 tabs on day 2, 4 tabs on day 3, 3 tabs on day 4, 2 tabs on day 5, 1 tab on day 6 21 tablet 0   promethazine-dextromethorphan (PROMETHAZINE-DM) 6.25-15 MG/5ML syrup Take 5 mLs by mouth 4 (four) times daily as needed. 118 mL 0   QUEtiapine (SEROQUEL) 50 MG tablet Take 1/2 to 1 tablet by mouth twice daily. 180 tablet 0   ranolazine (RANEXA) 500 MG 12 hr tablet Take 2 tablets (1,000 mg total) by mouth daily. 30 tablet 0   Selenium Sulfide 2.25 % SHAM Apply 1 application topically daily as needed. 1 Bottle 3   Semaglutide-Weight Management 0.5 MG/0.5ML SOAJ Inject 0.5 mg into the skin once a week. 6 mL 0   topiramate (TOPAMAX) 100 MG tablet Take 1 tablet by mouth daily. 30 tablet 0   venlafaxine XR (EFFEXOR-XR) 75 MG 24 hr capsule Take 3 capsules by mouth daily with breakfast. 270 capsule 0   No current facility-administered medications for this visit.    Allergies  Allergen Reactions   Fentanyl Swelling   Mirtazapine Swelling   Penicillins Anaphylaxis   Sulfa Antibiotics Hives   Ms Contin  [Morphine] Swelling    Family History  Problem Relation Age of Onset   Stroke Father    Cancer Father        Kidney    AAA (abdominal aortic aneurysm) Father    Depression Sister    Anxiety disorder Sister    Cancer Paternal Uncle        Lung   Depression Maternal Grandmother    Heart disease Paternal Grandmother    Stroke Paternal Grandmother    Diabetes Paternal Grandmother    Diabetes Paternal Grandfather     Social History   Socioeconomic History   Marital status: Married    Spouse name: Not on file   Number of children: Not on file   Years of education: Not on file   Highest education level: Master's degree (e.g., MA, MS, MEng, MEd, MSW, MBA)  Occupational  History   Not on file  Tobacco Use   Smoking status: Every Day    Types: Cigarettes, E-cigarettes   Smokeless tobacco: Never  Vaping Use   Vaping status: Some Days   Substances: Nicotine, Flavoring  Substance and Sexual Activity   Alcohol use: Yes    Alcohol/week: 0.0 standard drinks of alcohol    Comment: occasional   Drug use: No   Sexual activity: Yes  Other Topics Concern   Not on file  Social History Narrative   Not on file   Social Drivers of Health   Financial Resource Strain: Medium Risk (04/09/2023)   Overall Financial Resource Strain (CARDIA)    Difficulty of Paying Living Expenses: Somewhat hard  Food Insecurity: Food Insecurity Present (04/09/2023)   Hunger Vital Sign    Worried About Running Out of Food in the Last Year: Never true    Ran Out of Food in the Last Year: Sometimes true  Transportation Needs: No Transportation Needs (04/09/2023)   PRAPARE - Administrator, Civil Service (Medical): No    Lack of Transportation (Non-Medical): No  Physical Activity: Insufficiently Active (04/09/2023)   Exercise Vital Sign    Days of Exercise per Week: 1 day    Minutes of Exercise per Session: 20 min  Stress: Stress Concern Present (04/09/2023)   [employer] of Occupational Health - Occupational Stress Questionnaire    Feeling of Stress : Very much  Social Connections: Moderately Integrated (04/09/2023)   Social Connection and Isolation Panel [NHANES]    Frequency of Communication with Friends and Family: Once a week    Frequency of Social Gatherings with Friends and Family: Three times a week    Attends Religious Services: 1 to 4 times per year    Active Member of Clubs or Organizations: No    Attends Engineer, structural: Not on file    Marital Status: Married  Catering manager Violence: Not on file     Constitutional: Patient reports intermittent headaches.  Denies fever, malaise, fatigue, or abrupt weight changes.  HEENT: Denies eye  pain, eye redness, ear pain, ringing in the ears, wax buildup, runny nose, nasal congestion, bloody nose, or sore throat. Respiratory: Denies difficulty breathing, shortness of breath, cough or sputum production.   Cardiovascular: Patient reports intermittent chest pain.  Denies chest tightness, palpitations or swelling in the hands or feet.  Gastrointestinal: Denies abdominal pain, bloating, constipation, diarrhea or blood in the stool.  GU: Denies urgency,  frequency, pain with urination, burning sensation, blood in urine, odor or discharge. Musculoskeletal: Denies decrease in range of motion, difficulty with gait, muscle pain or joint pain and swelling.  Skin: Denies redness, rashes, lesions or ulcercations.  Neurological: Patient reports restless legs.  Denies dizziness, difficulty with memory, difficulty with speech or problems with balance and coordination.  Psych: Patient has a history of anxiety and depression.  Denies SI/HI.  No other specific complaints in a complete review of systems (except as listed in HPI above).  Objective:   Physical Exam BP 108/72 (BP Location: Left Arm, Patient Position: Sitting, Cuff Size: Normal)   Ht 5\' 6"  (1.676 m)   Wt 210 lb 6.4 oz (95.4 kg)   LMP 10/29/2023 (Approximate)   BMI 33.96 kg/m    Wt Readings from Last 3 Encounters:  09/12/23 212 lb (96.2 kg)  05/15/23 220 lb (99.8 kg)  05/09/23 220 lb (99.8 kg)    General: Appears her stated age, obese, in NAD. Skin: Warm, dry and intact.  HEENT: Head: normal shape and size; Eyes: sclera white and EOMs intact;  Neck:  Neck supple, trachea midline. No masses, lumps or thyromegaly present.  Cardiovascular: Normal rate and rhythm. S1,S2 noted.  No murmur, rubs or gallops noted. No JVD or BLE edema.  Pulmonary/Chest: Normal effort and positive vesicular breath sounds. No respiratory distress. No wheezes, rales or ronchi noted.  Abdomen: Soft and nontender. Normal bowel sounds.  Musculoskeletal:  Strength 5/5 BUE/BLE.  No difficulty with gait.  Neurological: Alert and oriented. Cranial nerves II-XII grossly intact. Coordination normal.  Psychiatric: Mood and affect normal. Behavior is normal. Judgment and thought content normal.   BMET    Component Value Date/Time   NA 141 05/15/2023 1506   K 4.0 05/15/2023 1506   CL 105 05/15/2023 1506   CO2 27 05/15/2023 1506   GLUCOSE 88 05/15/2023 1506   BUN 15 05/15/2023 1506   CREATININE 1.15 (H) 05/15/2023 1506   CALCIUM 8.9 05/15/2023 1506   GFRNONAA >60 05/09/2023 0251    Lipid Panel     Component Value Date/Time   CHOL 205 (H) 05/15/2023 1506   TRIG 127 05/15/2023 1506   HDL 97 05/15/2023 1506   CHOLHDL 2.1 05/15/2023 1506   VLDL 19.6 07/15/2020 1611   LDLCALC 86 05/15/2023 1506    CBC    Component Value Date/Time   WBC 11.1 (H) 05/15/2023 1506   RBC 4.18 05/15/2023 1506   HGB 13.7 05/15/2023 1506   HCT 42.2 05/15/2023 1506   PLT 443 (H) 05/15/2023 1506   MCV 101.0 (H) 05/15/2023 1506   MCH 32.8 05/15/2023 1506   MCHC 32.5 05/15/2023 1506   RDW 12.3 05/15/2023 1506   LYMPHSABS 1.3 05/09/2023 0251   MONOABS 0.4 05/09/2023 0251   EOSABS 0.4 05/09/2023 0251   BASOSABS 0.1 05/09/2023 0251    Hgb A1C Lab Results  Component Value Date   HGBA1C 5.3 05/15/2023            Assessment & Plan:   Preventative Health Maintenance:  Encouraged her to get a flu shot in the fall Tetanus UTD Encouraged her to get her COVID-vaccine  Pap smear UTD Mammogram ordered, she will call GI breast center to schedule She declines referral to GI for screening colonoscopy or Cologuard at this time Encouraged her to consume a balanced diet and exercise regimen Advised her to see an eye doctor and dentist annually We will check CBC, c-Met, lipid, A1c today  RTC in  6 months, follow-up chronic conditions  Nicki Reaper, NP

## 2023-11-21 ENCOUNTER — Encounter: Payer: Self-pay | Admitting: Internal Medicine

## 2023-11-21 LAB — CBC
HCT: 43.6 % (ref 35.0–45.0)
Hemoglobin: 14.4 g/dL (ref 11.7–15.5)
MCH: 33.1 pg — ABNORMAL HIGH (ref 27.0–33.0)
MCHC: 33 g/dL (ref 32.0–36.0)
MCV: 100.2 fL — ABNORMAL HIGH (ref 80.0–100.0)
MPV: 9.3 fL (ref 7.5–12.5)
Platelets: 355 10*3/uL (ref 140–400)
RBC: 4.35 10*6/uL (ref 3.80–5.10)
RDW: 12.5 % (ref 11.0–15.0)
WBC: 5.4 10*3/uL (ref 3.8–10.8)

## 2023-11-21 LAB — LIPID PANEL
Cholesterol: 185 mg/dL (ref <200)
HDL: 97 mg/dL (ref >=50)
LDL Cholesterol (Calc): 70 mg/dL
Non-HDL Cholesterol (Calc): 88 mg/dL (ref <130)
Total CHOL/HDL Ratio: 1.9 (calc) (ref <5.0)
Triglycerides: 92 mg/dL (ref <150)

## 2023-11-21 LAB — COMPREHENSIVE METABOLIC PANEL WITH GFR
AG Ratio: 1.8 (calc) (ref 1.0–2.5)
ALT: 8 U/L (ref 6–29)
AST: 12 U/L (ref 10–35)
Albumin: 4.4 g/dL (ref 3.6–5.1)
Alkaline phosphatase (APISO): 66 U/L (ref 31–125)
BUN/Creatinine Ratio: 13 (calc) (ref 6–22)
BUN: 15 mg/dL (ref 7–25)
CO2: 27 mmol/L (ref 20–32)
Calcium: 9.2 mg/dL (ref 8.6–10.2)
Chloride: 105 mmol/L (ref 98–110)
Creat: 1.17 mg/dL — ABNORMAL HIGH (ref 0.50–0.99)
Globulin: 2.4 g/dL (ref 1.9–3.7)
Glucose, Bld: 83 mg/dL (ref 65–99)
Potassium: 4.7 mmol/L (ref 3.5–5.3)
Sodium: 141 mmol/L (ref 135–146)
Total Bilirubin: 0.4 mg/dL (ref 0.2–1.2)
Total Protein: 6.8 g/dL (ref 6.1–8.1)
eGFR: 58 mL/min/{1.73_m2} — ABNORMAL LOW (ref >=60)

## 2023-11-21 LAB — HEMOGLOBIN A1C
Hgb A1c MFr Bld: 5.2 %{Hb} (ref <5.7)
Mean Plasma Glucose: 103 mg/dL
eAG (mmol/L): 5.7 mmol/L

## 2023-11-27 ENCOUNTER — Encounter: Payer: Self-pay | Admitting: Internal Medicine

## 2023-11-28 ENCOUNTER — Other Ambulatory Visit: Payer: Self-pay | Admitting: Internal Medicine

## 2023-11-28 ENCOUNTER — Telehealth: Payer: Self-pay

## 2023-11-28 NOTE — Telephone Encounter (Signed)
 Copied from CRM [id number]. Topic: Clinical - Prescription Issue >> Nov 28, 2023  3:24 PM Sasha H wrote: Reason for CRM: [employer] called in regards to a Prior Auth request for Zofran for this pt. Stated they will be faxing over a form to be completed by the provider.

## 2023-11-29 ENCOUNTER — Telehealth: Payer: Self-pay

## 2023-11-29 NOTE — Telephone Encounter (Signed)
 Copied from CRM [id number]. Topic: General - Other >> Nov 29, 2023  2:23 PM Santiya F wrote: Reason for CRM: [employer] is calling in wanting clarification on a request they received. They needed to know if the request is for continued coverage or reimbursement. She says if it's for continued coverage, the patient no longer has coverage under them. They stated they would be sending over a fax with the information as well. Ref #:[id number]

## 2023-11-29 NOTE — Telephone Encounter (Signed)
 Copied from CRM [id number]. Topic: General - Other >> Nov 29, 2023  8:45 AM Juluis Ok wrote: Reason for CRM: Brittney with [employer] calling to verify a coverage request for continued coverage or reimbursement for medications:  Zofran and Wegovy.  Callback # [phone] option 3 Ref# [id number]

## 2023-11-29 NOTE — Telephone Encounter (Signed)
 Requested Prescriptions  Pending Prescriptions Disp Refills   nitroGLYCERIN (NITROSTAT) 0.4 MG SL tablet [Pharmacy Med Name: Nitroglycerin 0.4mg  Sublingual Tablet] 25 tablet 1    Sig: Dissolve 1 tablet under the tongue every 5 minutes as needed for chest pain. Maximum dose 3 tablets per 24 hours period.     Cardiovascular:  Nitrates Failed - 11/29/2023  2:24 PM      Failed - Valid encounter within last 12 months    Recent Outpatient Visits           1 week ago Encounter for general adult medical examination with abnormal findings   [place] [facility] [place], [place] W, NP              Passed - Last BP in normal range    BP Readings from Last 1 Encounters:  11/20/23 108/72         Passed - Last Heart Rate in normal range    Pulse Readings from Last 1 Encounters:  05/15/23 81

## 2023-12-04 ENCOUNTER — Other Ambulatory Visit: Payer: Self-pay | Admitting: Internal Medicine

## 2023-12-04 DIAGNOSIS — F419 Anxiety disorder, unspecified: Secondary | ICD-10-CM

## 2023-12-04 DIAGNOSIS — G43819 Other migraine, intractable, without status migrainosus: Secondary | ICD-10-CM

## 2023-12-05 NOTE — Telephone Encounter (Signed)
 OV 11/20/23 Requested Prescriptions  Pending Prescriptions Disp Refills   busPIRone  (BUSPAR ) 5 MG tablet [Pharmacy Med Name: Buspirone  Hydrochloride 5mg  Tablet] 60 tablet 0    Sig: Take 1 tablet by mouth twice daily.     Psychiatry: Anxiolytics/Hypnotics - Non-controlled Failed - 12/05/2023 11:32 AM      Failed - Valid encounter within last 12 months    Recent Outpatient Visits           2 weeks ago Encounter for general adult medical examination with abnormal findings   [place] [facility] [place], [place] W, NP               atorvastatin  (LIPITOR) 20 MG tablet [Pharmacy Med Name: Atorvastatin  Calcium  20mg  Tablet] 30 tablet 0    Sig: Take 1 tablet by mouth daily.     Cardiovascular:  Antilipid - Statins Failed - 12/05/2023 11:32 AM      Failed - Valid encounter within last 12 months    Recent Outpatient Visits           2 weeks ago Encounter for general adult medical examination with abnormal findings   [place] [facility] [place], [place] W, NP              Failed - Lipid Panel in normal range within the last 12 months    Cholesterol  Date Value Ref Range Status  11/20/2023 185 <200 mg/dL Final   LDL Cholesterol (Calc)  Date Value Ref Range Status  11/20/2023 70 mg/dL (calc) Final    Comment:    Reference range: <100 . Desirable range <100 mg/dL for primary prevention;   <70 mg/dL for patients with CHD or diabetic patients  with > or = 2 CHD risk factors. Aaron Aas LDL-C is now calculated using the Martin-Hopkins  calculation, which is a validated novel method providing  better accuracy than the Friedewald equation in the  estimation of LDL-C.  Melinda Sprawls et al. Erroll Heard. 8295;621(30): 2061-2068  (http://education.[facility].com/faq/FAQ164)    HDL  Date Value Ref Range Status  11/20/2023 97 > OR = 50 mg/dL Final   Triglycerides  Date Value Ref Range Status  11/20/2023 92 <150 mg/dL Final         Passed - Patient is not  pregnant       hydrOXYzine  (ATARAX ) 10 MG tablet [Pharmacy Med Name: Hydroxyzine  Hydrochloride 10mg  Tablet] 60 tablet 0    Sig: Take 1 tablet by mouth twice daily as needed.     Ear, Nose, and Throat:  Antihistamines 2 Failed - 12/05/2023 11:32 AM      Failed - Cr in normal range and within 360 days    Creat  Date Value Ref Range Status  11/20/2023 1.17 (H) 0.50 - 0.99 mg/dL Final         Failed - Valid encounter within last 12 months    Recent Outpatient Visits           2 weeks ago Encounter for general adult medical examination with abnormal findings   [place] [facility] [place], [place] W, NP               topiramate  (TOPAMAX ) 100 MG tablet [Pharmacy Med Name: Topiramate  100mg  Tablet] 30 tablet 0    Sig: Take 1 tablet by mouth daily.     Neurology: Anticonvulsants - topiramate  & zonisamide Failed - 12/05/2023 11:32 AM      Failed - Cr in normal range and within  360 days    Creat  Date Value Ref Range Status  11/20/2023 1.17 (H) 0.50 - 0.99 mg/dL Final         Failed - Valid encounter within last 12 months    Recent Outpatient Visits           2 weeks ago Encounter for general adult medical examination with abnormal findings   [place] [facility] [place], [place] W, NP              Passed - CO2 in normal range and within 360 days    CO2  Date Value Ref Range Status  11/20/2023 27 20 - 32 mmol/L Final         Passed - ALT in normal range and within 360 days    ALT  Date Value Ref Range Status  11/20/2023 8 6 - 29 U/L Final         Passed - AST in normal range and within 360 days    AST  Date Value Ref Range Status  11/20/2023 12 10 - 35 U/L Final         Passed - Completed PHQ-2 or PHQ-9 in the last 360 days

## 2023-12-07 ENCOUNTER — Telehealth: Payer: Self-pay

## 2023-12-07 NOTE — Telephone Encounter (Signed)
 Prior authorization

## 2023-12-11 NOTE — Telephone Encounter (Signed)
 Is there an appeal we need to do. Typically the insurance calls requesting a peer to peer once the appeal has been denied.

## 2024-01-14 ENCOUNTER — Encounter: Payer: Self-pay | Admitting: Internal Medicine

## 2024-01-14 MED ORDER — SEMAGLUTIDE-WEIGHT MANAGEMENT 0.25 MG/0.5ML ~~LOC~~ SOAJ: 0.2500 mg | SUBCUTANEOUS | 0 refills | Status: DC

## 2024-02-02 ENCOUNTER — Other Ambulatory Visit: Payer: Self-pay | Admitting: Internal Medicine

## 2024-02-05 NOTE — Telephone Encounter (Signed)
 Requested medications are due for refill today.  yes  Requested medications are on the active medications list.  yes  Last refill. 10/16/2023 #180 0 rf  Future visit scheduled.   yes  Notes to clinic.  Refill not delegated.    Requested Prescriptions  Pending Prescriptions Disp Refills   QUEtiapine  (SEROQUEL ) 50 MG tablet [Pharmacy Med Name: Quetiapine  Fumarate 50mg  Tablet] 180 tablet 0    Sig: Take 1/2 to 1 tablet by mouth twice daily.     Not Delegated - Psychiatry:  Antipsychotics - Second Generation (Atypical) - quetiapine  Failed - 02/05/2024 12:43 PM      Failed - This refill cannot be delegated      Failed - TSH in normal range and within 360 days    TSH  Date Value Ref Range Status  12/08/2021 2.27 mIU/L Final    Comment:              Reference Range .           > or = 20 Years  0.40-4.50 .                Pregnancy Ranges           First trimester    0.26-2.66           Second trimester   0.55-2.73           Third trimester    0.43-2.91          Failed - Valid encounter within last 6 months    Recent Outpatient Visits           2 months ago Encounter for general adult medical examination with abnormal findings   [place] [facility] [place], [place] W, NP              Failed - Lipid Panel in normal range within the last 12 months    Cholesterol  Date Value Ref Range Status  11/20/2023 185 <200 mg/dL Final   LDL Cholesterol (Calc)  Date Value Ref Range Status  11/20/2023 70 mg/dL (calc) Final    Comment:    Reference range: <100 . Desirable range <100 mg/dL for primary prevention;   <70 mg/dL for patients with CHD or diabetic patients  with > or = 2 CHD risk factors. SABRA LDL-C is now calculated using the Martin-Hopkins  calculation, which is a validated novel method providing  better accuracy than the Friedewald equation in the  estimation of LDL-C.  Gladis APPLETHWAITE et al. SANDREA. 7986;689(80): 2061-2068   (http://education.[facility].com/faq/FAQ164)    HDL  Date Value Ref Range Status  11/20/2023 97 > OR = 50 mg/dL Final   Triglycerides  Date Value Ref Range Status  11/20/2023 92 <150 mg/dL Final         Passed - Completed PHQ-2 or PHQ-9 in the last 360 days      Passed - Last BP in normal range    BP Readings from Last 1 Encounters:  11/20/23 108/72         Passed - Last Heart Rate in normal range    Pulse Readings from Last 1 Encounters:  05/15/23 81         Passed - CBC within normal limits and completed in the last 12 months    WBC  Date Value Ref Range Status  11/20/2023 5.4 3.8 - 10.8 Thousand/uL Final   RBC  Date Value Ref Range Status  11/20/2023 4.35 3.80 - 5.10  Million/uL Final   Hemoglobin  Date Value Ref Range Status  11/20/2023 14.4 11.7 - 15.5 g/dL Final   HCT  Date Value Ref Range Status  11/20/2023 43.6 35.0 - 45.0 % Final   MCHC  Date Value Ref Range Status  11/20/2023 33.0 32.0 - 36.0 g/dL Final    Comment:    For adults, a slight decrease in the calculated MCHC value (in the range of 30 to 32 g/dL) is most likely not clinically significant; however, it should be interpreted with caution in correlation with other red cell parameters and the patient's clinical condition.    [facility]  Date Value Ref Range Status  11/20/2023 33.1 (H) 27.0 - 33.0 pg Final   MCV  Date Value Ref Range Status  11/20/2023 100.2 (H) 80.0 - 100.0 fL Final   No results found for: PLTCOUNTKUC, LABPLAT, POCPLA RDW  Date Value Ref Range Status  11/20/2023 12.5 11.0 - 15.0 % Final         Passed - CMP within normal limits and completed in the last 12 months    Albumin  Date Value Ref Range Status  07/15/2020 4.4 3.5 - 5.2 g/dL Final   Alkaline Phosphatase  Date Value Ref Range Status  07/15/2020 53 39 - 117 U/L Final   Alkaline phosphatase (APISO)  Date Value Ref Range Status  11/20/2023 66 31 - 125 U/L Final   ALT  Date Value Ref Range  Status  11/20/2023 8 6 - 29 U/L Final   AST  Date Value Ref Range Status  11/20/2023 12 10 - 35 U/L Final   BUN  Date Value Ref Range Status  11/20/2023 15 7 - 25 mg/dL Final   Calcium   Date Value Ref Range Status  11/20/2023 9.2 8.6 - 10.2 mg/dL Final   CO2  Date Value Ref Range Status  11/20/2023 27 20 - 32 mmol/L Final   Creat  Date Value Ref Range Status  11/20/2023 1.17 (H) 0.50 - 0.99 mg/dL Final   Glucose, Bld  Date Value Ref Range Status  11/20/2023 83 65 - 99 mg/dL Final    Comment:    .            Fasting reference interval .    Potassium  Date Value Ref Range Status  11/20/2023 4.7 3.5 - 5.3 mmol/L Final   Sodium  Date Value Ref Range Status  11/20/2023 141 135 - 146 mmol/L Final   Total Bilirubin  Date Value Ref Range Status  11/20/2023 0.4 0.2 - 1.2 mg/dL Final   Protein, UA  Date Value Ref Range Status  11/23/2017 neg  Final   Total Protein  Date Value Ref Range Status  11/20/2023 6.8 6.1 - 8.1 g/dL Final   GFR  Date Value Ref Range Status  07/15/2020 49.68 (L) >60.00 mL/min Final    Comment:    Calculated using the CKD-EPI Creatinine Equation (2021)   eGFR  Date Value Ref Range Status  11/20/2023 58 (L) > OR = 60 mL/min/1.38m2 Final   GFR, Estimated  Date Value Ref Range Status  05/09/2023 >60 >60 mL/min Final    Comment:    (NOTE) Calculated using the CKD-EPI Creatinine Equation (2021)          Signed Prescriptions Disp Refills   venlafaxine  XR (EFFEXOR -XR) 75 MG 24 hr capsule 270 capsule 0    Sig: Take 3 capsules by mouth daily with breakfast.     Psychiatry: Antidepressants - SNRI - desvenlafaxine &  venlafaxine  Failed - 02/05/2024 12:43 PM      Failed - Cr in normal range and within 360 days    Creat  Date Value Ref Range Status  11/20/2023 1.17 (H) 0.50 - 0.99 mg/dL Final         Failed - Valid encounter within last 6 months    Recent Outpatient Visits           2 months ago Encounter for general adult medical  examination with abnormal findings   [place] [facility] [place], [place] W, NP              Failed - Lipid Panel in normal range within the last 12 months    Cholesterol  Date Value Ref Range Status  11/20/2023 185 <200 mg/dL Final   LDL Cholesterol (Calc)  Date Value Ref Range Status  11/20/2023 70 mg/dL (calc) Final    Comment:    Reference range: <100 . Desirable range <100 mg/dL for primary prevention;   <70 mg/dL for patients with CHD or diabetic patients  with > or = 2 CHD risk factors. SABRA LDL-C is now calculated using the Martin-Hopkins  calculation, which is a validated novel method providing  better accuracy than the Friedewald equation in the  estimation of LDL-C.  Gladis APPLETHWAITE et al. SANDREA. 7986;689(80): 2061-2068  (http://education.[facility].com/faq/FAQ164)    HDL  Date Value Ref Range Status  11/20/2023 97 > OR = 50 mg/dL Final   Triglycerides  Date Value Ref Range Status  11/20/2023 92 <150 mg/dL Final         Passed - Completed PHQ-2 or PHQ-9 in the last 360 days      Passed - Last BP in normal range    BP Readings from Last 1 Encounters:  11/20/23 108/72

## 2024-02-05 NOTE — Telephone Encounter (Signed)
 Requested Prescriptions  Pending Prescriptions Disp Refills   venlafaxine  XR (EFFEXOR -XR) 75 MG 24 hr capsule [Pharmacy Med Name: Venlafaxine  Hydrochloride 75mg  Extended-Release Capsule] 270 capsule 0    Sig: Take 3 capsules by mouth daily with breakfast.     Psychiatry: Antidepressants - SNRI - desvenlafaxine & venlafaxine  Failed - 02/05/2024 12:42 PM      Failed - Cr in normal range and within 360 days    Creat  Date Value Ref Range Status  11/20/2023 1.17 (H) 0.50 - 0.99 mg/dL Final         Failed - Valid encounter within last 6 months    Recent Outpatient Visits           2 months ago Encounter for general adult medical examination with abnormal findings   [place] [facility] [place], [place] W, NP              Failed - Lipid Panel in normal range within the last 12 months    Cholesterol  Date Value Ref Range Status  11/20/2023 185 <200 mg/dL Final   LDL Cholesterol (Calc)  Date Value Ref Range Status  11/20/2023 70 mg/dL (calc) Final    Comment:    Reference range: <100 . Desirable range <100 mg/dL for primary prevention;   <70 mg/dL for patients with CHD or diabetic patients  with > or = 2 CHD risk factors. SABRA LDL-C is now calculated using the Martin-Hopkins  calculation, which is a validated novel method providing  better accuracy than the Friedewald equation in the  estimation of LDL-C.  Gladis APPLETHWAITE et al. SANDREA. 7986;689(80): 2061-2068  (http://education.[facility].com/faq/FAQ164)    HDL  Date Value Ref Range Status  11/20/2023 97 > OR = 50 mg/dL Final   Triglycerides  Date Value Ref Range Status  11/20/2023 92 <150 mg/dL Final         Passed - Completed PHQ-2 or PHQ-9 in the last 360 days      Passed - Last BP in normal range    BP Readings from Last 1 Encounters:  11/20/23 108/72          QUEtiapine  (SEROQUEL ) 50 MG tablet [Pharmacy Med Name: Quetiapine  Fumarate 50mg  Tablet] 180 tablet 0    Sig: Take 1/2 to 1 tablet by  mouth twice daily.     Not Delegated - Psychiatry:  Antipsychotics - Second Generation (Atypical) - quetiapine  Failed - 02/05/2024 12:42 PM      Failed - This refill cannot be delegated      Failed - TSH in normal range and within 360 days    TSH  Date Value Ref Range Status  12/08/2021 2.27 mIU/L Final    Comment:              Reference Range .           > or = 20 Years  0.40-4.50 .                Pregnancy Ranges           First trimester    0.26-2.66           Second trimester   0.55-2.73           Third trimester    0.43-2.91          Failed - Valid encounter within last 6 months    Recent Outpatient Visits           2 months ago Encounter  for general adult medical examination with abnormal findings   [place] [facility] [place], [place] W, NP              Failed - Lipid Panel in normal range within the last 12 months    Cholesterol  Date Value Ref Range Status  11/20/2023 185 <200 mg/dL Final   LDL Cholesterol (Calc)  Date Value Ref Range Status  11/20/2023 70 mg/dL (calc) Final    Comment:    Reference range: <100 . Desirable range <100 mg/dL for primary prevention;   <70 mg/dL for patients with CHD or diabetic patients  with > or = 2 CHD risk factors. SABRA LDL-C is now calculated using the Martin-Hopkins  calculation, which is a validated novel method providing  better accuracy than the Friedewald equation in the  estimation of LDL-C.  Gladis APPLETHWAITE et al. SANDREA. 7986;689(80): 2061-2068  (http://education.[facility].com/faq/FAQ164)    HDL  Date Value Ref Range Status  11/20/2023 97 > OR = 50 mg/dL Final   Triglycerides  Date Value Ref Range Status  11/20/2023 92 <150 mg/dL Final         Passed - Completed PHQ-2 or PHQ-9 in the last 360 days      Passed - Last BP in normal range    BP Readings from Last 1 Encounters:  11/20/23 108/72         Passed - Last Heart Rate in normal range    Pulse Readings from Last 1 Encounters:   05/15/23 81         Passed - CBC within normal limits and completed in the last 12 months    WBC  Date Value Ref Range Status  11/20/2023 5.4 3.8 - 10.8 Thousand/uL Final   RBC  Date Value Ref Range Status  11/20/2023 4.35 3.80 - 5.10 Million/uL Final   Hemoglobin  Date Value Ref Range Status  11/20/2023 14.4 11.7 - 15.5 g/dL Final   HCT  Date Value Ref Range Status  11/20/2023 43.6 35.0 - 45.0 % Final   MCHC  Date Value Ref Range Status  11/20/2023 33.0 32.0 - 36.0 g/dL Final    Comment:    For adults, a slight decrease in the calculated MCHC value (in the range of 30 to 32 g/dL) is most likely not clinically significant; however, it should be interpreted with caution in correlation with other red cell parameters and the patient's clinical condition.    [facility]  Date Value Ref Range Status  11/20/2023 33.1 (H) 27.0 - 33.0 pg Final   MCV  Date Value Ref Range Status  11/20/2023 100.2 (H) 80.0 - 100.0 fL Final   No results found for: PLTCOUNTKUC, LABPLAT, POCPLA RDW  Date Value Ref Range Status  11/20/2023 12.5 11.0 - 15.0 % Final         Passed - CMP within normal limits and completed in the last 12 months    Albumin  Date Value Ref Range Status  07/15/2020 4.4 3.5 - 5.2 g/dL Final   Alkaline Phosphatase  Date Value Ref Range Status  07/15/2020 53 39 - 117 U/L Final   Alkaline phosphatase (APISO)  Date Value Ref Range Status  11/20/2023 66 31 - 125 U/L Final   ALT  Date Value Ref Range Status  11/20/2023 8 6 - 29 U/L Final   AST  Date Value Ref Range Status  11/20/2023 12 10 - 35 U/L Final   BUN  Date Value Ref Range Status  11/20/2023 15 7 - 25 mg/dL Final   Calcium   Date Value Ref Range Status  11/20/2023 9.2 8.6 - 10.2 mg/dL Final   CO2  Date Value Ref Range Status  11/20/2023 27 20 - 32 mmol/L Final   Creat  Date Value Ref Range Status  11/20/2023 1.17 (H) 0.50 - 0.99 mg/dL Final   Glucose, Bld  Date Value Ref Range Status   11/20/2023 83 65 - 99 mg/dL Final    Comment:    .            Fasting reference interval .    Potassium  Date Value Ref Range Status  11/20/2023 4.7 3.5 - 5.3 mmol/L Final   Sodium  Date Value Ref Range Status  11/20/2023 141 135 - 146 mmol/L Final   Total Bilirubin  Date Value Ref Range Status  11/20/2023 0.4 0.2 - 1.2 mg/dL Final   Protein, UA  Date Value Ref Range Status  11/23/2017 neg  Final   Total Protein  Date Value Ref Range Status  11/20/2023 6.8 6.1 - 8.1 g/dL Final   GFR  Date Value Ref Range Status  07/15/2020 49.68 (L) >60.00 mL/min Final    Comment:    Calculated using the CKD-EPI Creatinine Equation (2021)   eGFR  Date Value Ref Range Status  11/20/2023 58 (L) > OR = 60 mL/min/1.68m2 Final   GFR, Estimated  Date Value Ref Range Status  05/09/2023 >60 >60 mL/min Final    Comment:    (NOTE) Calculated using the CKD-EPI Creatinine Equation (2021)

## 2024-02-11 ENCOUNTER — Ambulatory Visit: Payer: Medicare (Managed Care) | Admitting: Internal Medicine

## 2024-02-11 NOTE — Progress Notes (Deleted)
 Subjective:    Patient ID: Dana Miller, female    DOB: 1976/03/21, 48 y.o.   MRN: [id number]  HPI    Review of Systems     Past Medical History:  Diagnosis Date   Anxiety    Cardiac microvascular disease    Chicken pox    Depression    Thyroid  mass    right side    Current Outpatient Medications  Medication Sig Dispense Refill   albuterol  (VENTOLIN  HFA) 108 (90 Base) MCG/ACT inhaler Inhale 2 puffs into the lungs every 6 (six) hours as needed for wheezing or shortness of breath. 8 g 0   amLODipine  (NORVASC ) 10 MG tablet Take 1 tablet (10 mg total) by mouth daily. 90 tablet 1   aspirin EC 81 MG tablet Take 1 tablet by mouth daily.     atorvastatin  (LIPITOR) 20 MG tablet Take 1 tablet by mouth daily. 30 tablet 5   busPIRone  (BUSPAR ) 5 MG tablet Take 1 tablet by mouth twice daily. 60 tablet 5   carvedilol  (COREG ) 25 MG tablet Take 1 tablet (25 mg total) by mouth 2 (two) times daily. 180 tablet 1   EPINEPHrine  0.3 mg/0.3 mL IJ SOAJ injection Inject 0.3 mg into the muscle as needed. For Anaphylaxis 1 each 0   fexofenadine (ALLEGRA) 180 MG tablet Take 1 tablet by mouth daily.     hydrOXYzine  (ATARAX ) 10 MG tablet Take 1 tablet by mouth twice daily as needed. 60 tablet 5   nitroGLYCERIN  (NITROSTAT ) 0.4 MG SL tablet Dissolve 1 tablet under the tongue every 5 minutes as needed for chest pain. Maximum dose 3 tablets per 24 hours period. 25 tablet 1   ondansetron  (ZOFRAN -ODT) 4 MG disintegrating tablet Take 1 tablet (4 mg total) by mouth every 8 (eight) hours as needed for nausea or vomiting. 30 tablet 0   pramipexole  (MIRAPEX ) 0.5 MG tablet Take 1 tablet by mouth at bedtime. 30 tablet 0   QUEtiapine  (SEROQUEL ) 50 MG tablet Take 1/2 to 1 tablet by mouth twice daily. 180 tablet 0   ranolazine  (RANEXA ) 500 MG 12 hr tablet Take 2 tablets (1,000 mg total) by mouth daily. 30 tablet 0   Semaglutide -Weight Management 0.25 MG/0.5ML SOAJ Inject 0.25 mg into the skin once a week. 2 mL 0    SUMAtriptan  (IMITREX ) 25 MG tablet Take 25 mg by mouth every 2 (two) hours as needed for migraine.     topiramate  (TOPAMAX ) 100 MG tablet Take 1 tablet by mouth daily. 30 tablet 5   venlafaxine  XR (EFFEXOR -XR) 75 MG 24 hr capsule Take 3 capsules by mouth daily with breakfast. 270 capsule 0   No current facility-administered medications for this visit.    Allergies  Allergen Reactions   Fentanyl Swelling   Mirtazapine Swelling   Penicillins Anaphylaxis   Sulfa Antibiotics Hives   Ms Contin  [Morphine] Swelling    Family History  Problem Relation Age of Onset   Stroke Father    Cancer Father        Kidney    AAA (abdominal aortic aneurysm) Father    Depression Sister    Anxiety disorder Sister    Cancer Paternal Uncle        Lung   Depression Maternal Grandmother    Heart disease Paternal Grandmother    Stroke Paternal Grandmother    Diabetes Paternal Grandmother    Diabetes Paternal Grandfather     Social History   Socioeconomic History   Marital status: Married  Spouse name: Not on file   Number of children: Not on file   Years of education: Not on file   Highest education level: Master's degree (e.g., MA, MS, MEng, MEd, MSW, MBA)  Occupational History   Not on file  Tobacco Use   Smoking status: Every Day    Types: Cigarettes, E-cigarettes   Smokeless tobacco: Never  Vaping Use   Vaping status: Some Days   Substances: Nicotine, Flavoring  Substance and Sexual Activity   Alcohol use: Yes    Alcohol/week: 0.0 standard drinks of alcohol    Comment: occasional   Drug use: No   Sexual activity: Yes  Other Topics Concern   Not on file  Social History Narrative   Not on file   Social Drivers of Health   Financial Resource Strain: Medium Risk (11/20/2023)   Overall Financial Resource Strain (CARDIA)    Difficulty of Paying Living Expenses: Somewhat hard  Food Insecurity: No Food Insecurity (11/20/2023)   Hunger Vital Sign    Worried About Running Out of Food  in the Last Year: Never true    Ran Out of Food in the Last Year: Never true  Transportation Needs: No Transportation Needs (11/20/2023)   PRAPARE - Administrator, Civil Service (Medical): No    Lack of Transportation (Non-Medical): No  Physical Activity: Insufficiently Active (11/20/2023)   Exercise Vital Sign    Days of Exercise per Week: 1 day    Minutes of Exercise per Session: 10 min  Stress: Stress Concern Present (11/20/2023)   [employer] of Occupational Health - Occupational Stress Questionnaire    Feeling of Stress : Very much  Social Connections: Moderately Isolated (11/20/2023)   Social Connection and Isolation Panel    Frequency of Communication with Friends and Family: Never    Frequency of Social Gatherings with Friends and Family: Three times a week    Attends Religious Services: Never    Active Member of Clubs or Organizations: No    Attends Engineer, structural: Not on file    Marital Status: Married  Catering manager Violence: Not on file     Constitutional: Patient reports intermittent headaches.  Denies fever, malaise, fatigue, or abrupt weight changes.  HEENT: Denies eye pain, eye redness, ear pain, ringing in the ears, wax buildup, runny nose, nasal congestion, bloody nose, or sore throat. Respiratory: Denies difficulty breathing, shortness of breath, cough or sputum production.   Cardiovascular: Patient reports intermittent chest pain.  Denies chest tightness, palpitations or swelling in the hands or feet.  Gastrointestinal: Denies abdominal pain, bloating, constipation, diarrhea or blood in the stool.  GU: Denies urgency, frequency, pain with urination, burning sensation, blood in urine, odor or discharge. Musculoskeletal: Denies decrease in range of motion, difficulty with gait, muscle pain or joint pain and swelling.  Skin: Denies redness, rashes, lesions or ulcercations.  Neurological: Patient reports restless legs.  Denies dizziness,  difficulty with memory, difficulty with speech or problems with balance and coordination.  Psych: Patient has a history of anxiety and depression.  Denies SI/HI.  No other specific complaints in a complete review of systems (except as listed in HPI above).  Objective:   Physical Exam There were no vitals taken for this visit.   Wt Readings from Last 3 Encounters:  11/20/23 210 lb 6.4 oz (95.4 kg)  09/12/23 212 lb (96.2 kg)  05/15/23 220 lb (99.8 kg)    General: Appears her stated age, obese, in NAD. Skin: Warm,  dry and intact.  HEENT: Head: normal shape and size; Eyes: sclera white and EOMs intact;  Neck:  Neck supple, trachea midline. No masses, lumps or thyromegaly present.  Cardiovascular: Normal rate and rhythm. S1,S2 noted.  No murmur, rubs or gallops noted. No JVD or BLE edema.  Pulmonary/Chest: Normal effort and positive vesicular breath sounds. No respiratory distress. No wheezes, rales or ronchi noted.  Abdomen: Soft and nontender. Normal bowel sounds.  Musculoskeletal: Strength 5/5 BUE/BLE.  No difficulty with gait.  Neurological: Alert and oriented. Cranial nerves II-XII grossly intact. Coordination normal.  Psychiatric: Mood and affect normal. Behavior is normal. Judgment and thought content normal.   BMET    Component Value Date/Time   NA 141 11/20/2023 1418   K 4.7 11/20/2023 1418   CL 105 11/20/2023 1418   CO2 27 11/20/2023 1418   GLUCOSE 83 11/20/2023 1418   BUN 15 11/20/2023 1418   CREATININE 1.17 (H) 11/20/2023 1418   CALCIUM  9.2 11/20/2023 1418   GFRNONAA >60 05/09/2023 0251    Lipid Panel     Component Value Date/Time   CHOL 185 11/20/2023 1418   TRIG 92 11/20/2023 1418   HDL 97 11/20/2023 1418   CHOLHDL 1.9 11/20/2023 1418   VLDL 19.6 07/15/2020 1611   LDLCALC 70 11/20/2023 1418    CBC    Component Value Date/Time   WBC 5.4 11/20/2023 1418   RBC 4.35 11/20/2023 1418   HGB 14.4 11/20/2023 1418   HCT 43.6 11/20/2023 1418   PLT 355  11/20/2023 1418   MCV 100.2 (H) 11/20/2023 1418   MCH 33.1 (H) 11/20/2023 1418   MCHC 33.0 11/20/2023 1418   RDW 12.5 11/20/2023 1418   LYMPHSABS 1.3 05/09/2023 0251   MONOABS 0.4 05/09/2023 0251   EOSABS 0.4 05/09/2023 0251   BASOSABS 0.1 05/09/2023 0251    Hgb A1C Lab Results  Component Value Date   HGBA1C 5.2 11/20/2023            Assessment & Plan:    RTC in 4 months, follow-up chronic conditions  Angeline Laura, NP

## 2024-04-24 ENCOUNTER — Other Ambulatory Visit: Payer: Self-pay | Admitting: Internal Medicine

## 2024-04-25 NOTE — Telephone Encounter (Signed)
 Requested Prescriptions  Pending Prescriptions Disp Refills   amLODipine  (NORVASC ) 10 MG tablet 90 tablet 0    Sig: Take 1 tablet by mouth daily.     Cardiovascular: Calcium  Channel Blockers 2 Passed - 04/25/2024 11:43 AM      Passed - Last BP in normal range    BP Readings from Last 1 Encounters:  11/20/23 108/72         Passed - Last Heart Rate in normal range    Pulse Readings from Last 1 Encounters:  05/15/23 81         Passed - Valid encounter within last 6 months    Recent Outpatient Visits           5 months ago Encounter for general adult medical examination with abnormal findings   [place] [facility] [place], Angeline ORN, NP               carvedilol  (COREG ) 25 MG tablet 180 tablet 0    Sig: Take 1 tablet by mouth twice daily.     Cardiovascular: Beta Blockers 3 Failed - 04/25/2024 11:43 AM      Failed - Cr in normal range and within 360 days    Creat  Date Value Ref Range Status  11/20/2023 1.17 (H) 0.50 - 0.99 mg/dL Final         Passed - AST in normal range and within 360 days    AST  Date Value Ref Range Status  11/20/2023 12 10 - 35 U/L Final         Passed - ALT in normal range and within 360 days    ALT  Date Value Ref Range Status  11/20/2023 8 6 - 29 U/L Final         Passed - Last BP in normal range    BP Readings from Last 1 Encounters:  11/20/23 108/72         Passed - Last Heart Rate in normal range    Pulse Readings from Last 1 Encounters:  05/15/23 81         Passed - Valid encounter within last 6 months    Recent Outpatient Visits           5 months ago Encounter for general adult medical examination with abnormal findings   [place] [facility] [place], Angeline ORN, NP

## 2024-05-10 ENCOUNTER — Other Ambulatory Visit: Payer: Self-pay | Admitting: Internal Medicine

## 2024-05-10 DIAGNOSIS — R252 Cramp and spasm: Secondary | ICD-10-CM

## 2024-05-13 NOTE — Telephone Encounter (Signed)
 Requested medication (s) are due for refill today: yes  Requested medication (s) are on the active medication list: yes  Last refill:  07/23/23 #30  Future visit scheduled: yes  Notes to clinic:  Med not delegated to NT to RF    Requested Prescriptions  Pending Prescriptions Disp Refills   pramipexole  (MIRAPEX ) 0.5 MG tablet [Pharmacy Med Name: Pramipexole  Dihydrochloride 0.5mg  Tablet] 90 tablet     Sig: Take 1 tablet by mouth at bedtime.     Neurology:  Parkinsonian Agents Passed - 05/13/2024 12:10 PM      Passed - Last BP in normal range    BP Readings from Last 1 Encounters:  11/20/23 108/72         Passed - Last Heart Rate in normal range    Pulse Readings from Last 1 Encounters:  05/15/23 81         Passed - Valid encounter within last 12 months    Recent Outpatient Visits           5 months ago Encounter for general adult medical examination with abnormal findings   [place] [facility] [place], [place], NP               ranolazine  (RANEXA ) 500 MG 12 hr tablet 30 tablet 0    Sig: Take 2 tablets by mouth daily.     Not Delegated - Cardiovascular: Ranolazine  Failed - 05/13/2024 12:10 PM      Failed - This refill cannot be delegated      Failed - Cr in normal range and within 360 days    Creat  Date Value Ref Range Status  11/20/2023 1.17 (H) 0.50 - 0.99 mg/dL Final         Passed - K in normal range and within 360 days    Potassium  Date Value Ref Range Status  11/20/2023 4.7 3.5 - 5.3 mmol/L Final         Passed - Last BP in normal range    BP Readings from Last 1 Encounters:  11/20/23 108/72         Passed - Valid encounter within last 12 months    Recent Outpatient Visits           5 months ago Encounter for general adult medical examination with abnormal findings   [place] [facility] [place], Angeline ORN, NP

## 2024-05-22 ENCOUNTER — Encounter: Payer: Self-pay | Admitting: Internal Medicine

## 2024-05-22 ENCOUNTER — Ambulatory Visit: Payer: Medicare (Managed Care) | Admitting: Internal Medicine

## 2024-05-22 VITALS — BP 110/70 | Ht 66.0 in | Wt 208.8 lb

## 2024-05-22 DIAGNOSIS — Z23 Encounter for immunization: Secondary | ICD-10-CM | POA: Diagnosis not present

## 2024-05-22 DIAGNOSIS — F419 Anxiety disorder, unspecified: Secondary | ICD-10-CM

## 2024-05-22 DIAGNOSIS — F32A Depression, unspecified: Secondary | ICD-10-CM

## 2024-05-22 DIAGNOSIS — I2585 Chronic coronary microvascular dysfunction: Secondary | ICD-10-CM

## 2024-05-22 DIAGNOSIS — E66811 Obesity, class 1: Secondary | ICD-10-CM

## 2024-05-22 DIAGNOSIS — Z6833 Body mass index (BMI) 33.0-33.9, adult: Secondary | ICD-10-CM

## 2024-05-22 DIAGNOSIS — R062 Wheezing: Secondary | ICD-10-CM

## 2024-05-22 DIAGNOSIS — F17200 Nicotine dependence, unspecified, uncomplicated: Secondary | ICD-10-CM | POA: Diagnosis not present

## 2024-05-22 DIAGNOSIS — E041 Nontoxic single thyroid nodule: Secondary | ICD-10-CM

## 2024-05-22 DIAGNOSIS — E6609 Other obesity due to excess calories: Secondary | ICD-10-CM

## 2024-05-22 DIAGNOSIS — G43819 Other migraine, intractable, without status migrainosus: Secondary | ICD-10-CM

## 2024-05-22 DIAGNOSIS — G2581 Restless legs syndrome: Secondary | ICD-10-CM

## 2024-05-22 MED ORDER — ALBUTEROL SULFATE HFA 108 (90 BASE) MCG/ACT IN AERS: 2.0000 | INHALATION_SPRAY | Freq: Four times a day (QID) | RESPIRATORY_TRACT | 0 refills | Status: AC | PRN

## 2024-05-22 MED ORDER — PREDNISONE 10 MG PO TABS: ORAL_TABLET | ORAL | 0 refills | Status: AC

## 2024-05-22 NOTE — Assessment & Plan Note (Signed)
 Encourage stress reduction techniques Continue topamax  100 mg daily and imitrex  25 mg daily.

## 2024-05-22 NOTE — Assessment & Plan Note (Addendum)
 Complicated by obesity Will check lipid profile with annual exam Continue amlodipine  10 mg daily, carvedilol  25 mg twice daily, aspirin 81 mg daily, atorvastatin  20 mg daily and ranexa  500 mg twice daily She will continue to follow with cardiology

## 2024-05-22 NOTE — Assessment & Plan Note (Signed)
 Continue pramipexole  0.5 mg at bedtime

## 2024-05-22 NOTE — Progress Notes (Signed)
 Subjective:    Patient ID: Jonay Hernon, female    DOB: Dec 12, 1975, 48 y.o.   MRN: [id number]  HPI  Patient presents to clinic today for follow-up of chronic conditions.  Anxiety and depression: Chronic, managed on venlafaxine , buspirone , seroquel  and hydroxyzine . She does feel like her anxiety has been worse latley, but she is unsure what is triggering this. She is not currently seeing a therapist or psychiatrist.  She denies SI/HI.  Chronic microvascular heart disease: Her last LDL was 70, triglycerides 92, 11/2023.  She reports intermittent chest pain.  She is taking amlodipine , carvedilol , aspirin, atorvastatin  and ranexa  as prescribed.  She follows with cardiology.  Migraines: These occur a few times per week.  Triggered by stress.  She is taking topamax  and imitrex  as prescribed.  She does not follow with neurology.  RLS: Managed with pramipexole .  She does not follow with neurology.  Thyroid  nodule: She is due for repeat ultrasound.  Last thyroid  ultrasound 10/2021  She has been experiencing chronic shortness of breath and wheezing for several months. The wheezing is particularly noticeable when lying down to sleep at night. There is no associated cough, and the shortness of breath does not worsen with exertion; it seems to improve with movement. She has a history of smoking for eighteen years before switching to vaping, which she has been doing for the past four years. She continues to vape with nicotine. She denies any history of asthma and reports no leg swelling except when it is very hot outside.  She mentions having a bump on her head for about four months. It does not itch, burn, hurt, or drain, and it does not cause any issues.      Review of Systems  Past Medical History:  Diagnosis Date   Anxiety    Cardiac microvascular disease    Chicken pox    Depression    Thyroid  mass    right side    Current Outpatient Medications  Medication Sig Dispense Refill    albuterol  (VENTOLIN  HFA) 108 (90 Base) MCG/ACT inhaler Inhale 2 puffs into the lungs every 6 (six) hours as needed for wheezing or shortness of breath. 8 g 0   amLODipine  (NORVASC ) 10 MG tablet Take 1 tablet by mouth daily. 90 tablet 0   aspirin EC 81 MG tablet Take 1 tablet by mouth daily.     atorvastatin  (LIPITOR) 20 MG tablet Take 1 tablet by mouth daily. 30 tablet 5   busPIRone  (BUSPAR ) 5 MG tablet Take 1 tablet by mouth twice daily. 60 tablet 5   carvedilol  (COREG ) 25 MG tablet Take 1 tablet by mouth twice daily. 180 tablet 0   EPINEPHrine  0.3 mg/0.3 mL IJ SOAJ injection Inject 0.3 mg into the muscle as needed. For Anaphylaxis 1 each 0   fexofenadine (ALLEGRA) 180 MG tablet Take 1 tablet by mouth daily.     hydrOXYzine  (ATARAX ) 10 MG tablet Take 1 tablet by mouth twice daily as needed. 60 tablet 5   nitroGLYCERIN  (NITROSTAT ) 0.4 MG SL tablet Dissolve 1 tablet under the tongue every 5 minutes as needed for chest pain. Maximum dose 3 tablets per 24 hours period. 25 tablet 1   ondansetron  (ZOFRAN -ODT) 4 MG disintegrating tablet Take 1 tablet (4 mg total) by mouth every 8 (eight) hours as needed for nausea or vomiting. 30 tablet 0   pramipexole  (MIRAPEX ) 0.5 MG tablet Take 1 tablet by mouth at bedtime. 90 tablet 0   QUEtiapine  (SEROQUEL ) 50 MG tablet Take  1/2 to 1 tablet by mouth twice daily. 180 tablet 0   ranolazine  (RANEXA ) 500 MG 12 hr tablet Take 2 tablets by mouth daily. 30 tablet 0   Semaglutide -Weight Management 0.25 MG/0.5ML SOAJ Inject 0.25 mg into the skin once a week. 2 mL 0   SUMAtriptan  (IMITREX ) 25 MG tablet Take 25 mg by mouth every 2 (two) hours as needed for migraine.     topiramate  (TOPAMAX ) 100 MG tablet Take 1 tablet by mouth daily. 30 tablet 5   venlafaxine  XR (EFFEXOR -XR) 75 MG 24 hr capsule Take 3 capsules by mouth daily with breakfast. 270 capsule 0   No current facility-administered medications for this visit.    Allergies  Allergen Reactions   Fentanyl Swelling    Mirtazapine Swelling   Penicillins Anaphylaxis   Sulfa Antibiotics Hives   Ms Contin  [Morphine] Swelling    Family History  Problem Relation Age of Onset   Stroke Father    Cancer Father        Kidney    AAA (abdominal aortic aneurysm) Father    Depression Sister    Anxiety disorder Sister    Cancer Paternal Uncle        Lung   Depression Maternal Grandmother    Heart disease Paternal Grandmother    Stroke Paternal Grandmother    Diabetes Paternal Grandmother    Diabetes Paternal Grandfather     Social History   Socioeconomic History   Marital status: Married    Spouse name: Not on file   Number of children: Not on file   Years of education: Not on file   Highest education level: Master's degree (e.g., MA, MS, MEng, MEd, MSW, MBA)  Occupational History   Not on file  Tobacco Use   Smoking status: Every Day    Types: Cigarettes, E-cigarettes   Smokeless tobacco: Never  Vaping Use   Vaping status: Some Days   Substances: Nicotine, Flavoring  Substance and Sexual Activity   Alcohol use: Yes    Alcohol/week: 0.0 standard drinks of alcohol    Comment: occasional   Drug use: No   Sexual activity: Yes  Other Topics Concern   Not on file  Social History Narrative   Not on file   Social Drivers of Health   Financial Resource Strain: Medium Risk (11/20/2023)   Overall Financial Resource Strain (CARDIA)    Difficulty of Paying Living Expenses: Somewhat hard  Food Insecurity: No Food Insecurity (11/20/2023)   Hunger Vital Sign    Worried About Running Out of Food in the Last Year: Never true    Ran Out of Food in the Last Year: Never true  Transportation Needs: No Transportation Needs (11/20/2023)   PRAPARE - Administrator, Civil Service (Medical): No    Lack of Transportation (Non-Medical): No  Physical Activity: Insufficiently Active (11/20/2023)   Exercise Vital Sign    Days of Exercise per Week: 1 day    Minutes of Exercise per Session: 10 min  Stress:  Stress Concern Present (11/20/2023)   [employer] of Occupational Health - Occupational Stress Questionnaire    Feeling of Stress : Very much  Social Connections: Moderately Isolated (11/20/2023)   Social Connection and Isolation Panel    Frequency of Communication with Friends and Family: Never    Frequency of Social Gatherings with Friends and Family: Three times a week    Attends Religious Services: Never    Active Member of Clubs or Organizations: No  Attends Banker Meetings: Not on file    Marital Status: Married  Intimate Partner Violence: Not on file     Constitutional: Patient reports intermittent headaches.  Denies fever, malaise, fatigue, or abrupt weight changes.  HEENT: Denies eye pain, eye redness, ear pain, ringing in the ears, wax buildup, runny nose, nasal congestion, bloody nose, or sore throat. Respiratory: Pt reports shortness of breath. Denies difficulty breathing, cough or sputum production.   Cardiovascular: Patient reports intermittent chest pain.  Denies chest tightness, palpitations or swelling in the hands or feet.  Gastrointestinal: Denies abdominal pain, bloating, constipation, diarrhea or blood in the stool.  GU: Denies urgency, frequency, pain with urination, burning sensation, blood in urine, odor or discharge. Musculoskeletal: Denies decrease in range of motion, difficulty with gait, muscle pain or joint pain and swelling.  Skin: Pt reports skin lesion of scalp. Denies redness, rashes, or ulcercations.  Neurological: Patient reports restless legs, insomnia.  Denies dizziness, difficulty with memory, difficulty with speech or problems with balance and coordination.  Psych: Patient has a history of anxiety and depression.  Denies SI/HI.  No other specific complaints in a complete review of systems (except as listed in HPI above).     Objective:   Physical Exam  BP 110/70 (BP Location: Left Arm, Patient Position: Sitting, Cuff Size:  Normal)   Ht 5' 6 (1.676 m)   Wt 208 lb 12.8 oz (94.7 kg)   LMP 04/26/2024 (Approximate)   BMI 33.70 kg/m    Wt Readings from Last 3 Encounters:  11/20/23 210 lb 6.4 oz (95.4 kg)  09/12/23 212 lb (96.2 kg)  05/15/23 220 lb (99.8 kg)    General: Appears her stated age, obese, in NAD. Skin: Warm, dry and intact. 0.5cm hypopigmented rough lesion of the scalp. HEENT: Head: normal shape and size; Eyes: sclera white, no icterus, conjunctiva pink, PERRLA and EOMs intact;   Cardiovascular: Normal rate and rhythm. S1,S2 noted.  No murmur, rubs or gallops noted. No JVD or BLE edema.  Pulmonary/Chest: Normal effort and positive vesicular breath sounds with bilateral inspiratory and expiratory wheezing noted. No respiratory distress. No rales or ronchi noted.  Musculoskeletal: No difficulty with gait.  Neurological: Alert and oriented. Coordination normal.  Psychiatric: Mood and affect normal. Behavior is normal. Judgment and thought content normal.    BMET    Component Value Date/Time   NA 141 11/20/2023 1418   K 4.7 11/20/2023 1418   CL 105 11/20/2023 1418   CO2 27 11/20/2023 1418   GLUCOSE 83 11/20/2023 1418   BUN 15 11/20/2023 1418   CREATININE 1.17 (H) 11/20/2023 1418   CALCIUM  9.2 11/20/2023 1418   GFRNONAA >60 05/09/2023 0251    Lipid Panel     Component Value Date/Time   CHOL 185 11/20/2023 1418   TRIG 92 11/20/2023 1418   HDL 97 11/20/2023 1418   CHOLHDL 1.9 11/20/2023 1418   VLDL 19.6 07/15/2020 1611   LDLCALC 70 11/20/2023 1418    CBC    Component Value Date/Time   WBC 5.4 11/20/2023 1418   RBC 4.35 11/20/2023 1418   HGB 14.4 11/20/2023 1418   HCT 43.6 11/20/2023 1418   PLT 355 11/20/2023 1418   MCV 100.2 (H) 11/20/2023 1418   MCH 33.1 (H) 11/20/2023 1418   MCHC 33.0 11/20/2023 1418   RDW 12.5 11/20/2023 1418   LYMPHSABS 1.3 05/09/2023 0251   MONOABS 0.4 05/09/2023 0251   EOSABS 0.4 05/09/2023 0251   BASOSABS 0.1 05/09/2023 0251  Hgb A1C Lab  Results  Component Value Date   HGBA1C 5.2 11/20/2023           Assessment & Plan:  Assessment and Plan    Wheezing and shortness of breath Chronic wheezing and dyspnea, worsened when supine, improved with activity. Long-term smoking and nicotine vaping history. Differential includes COPD and microvascular issues. Previous chest x-ray negative for COPD. Wheezing noted. - Prescribed 6-day prednisone  taper. - Prescribed albuterol  inhaler. - Ordered CT lung cancer screening.  Tobacco and nicotine use 18-year smoking history, currently using nicotine vape for 4 years. Concern for potential COPD and lung issues.  Skin lesion of scalp: -appears to be a benign mole, -monitor at this time   RTC in 6 months for your annual exam  Angeline Laura, NP

## 2024-05-22 NOTE — Assessment & Plan Note (Signed)
Ultrasound thyroid ordered

## 2024-05-22 NOTE — Assessment & Plan Note (Signed)
 Encouraged diet and exercise for weight loss ?

## 2024-05-22 NOTE — Assessment & Plan Note (Signed)
 Stable on her current dose of venlafaxine  225 mg daily, buspirone  5 mg twice daily, seroquel  25 to 50 mg twice daily and hydroxyzine  10 mg twice daily She will consider scheduling appointment with a therapist and psychiatrist at this time Support offered

## 2024-05-22 NOTE — Patient Instructions (Signed)

## 2024-05-23 ENCOUNTER — Telehealth: Payer: Self-pay

## 2024-05-23 ENCOUNTER — Encounter: Payer: Self-pay | Admitting: Internal Medicine

## 2024-05-23 MED ORDER — SEMAGLUTIDE-WEIGHT MANAGEMENT 1 MG/0.5ML ~~LOC~~ SOAJ: 1.0000 mg | SUBCUTANEOUS | 0 refills | Status: AC

## 2024-05-23 NOTE — Telephone Encounter (Signed)
 Copied from CRM [id number]. Topic: Referral - Status >> May 23, 2024 10:02 AM Zebedee SAUNDERS wrote: Reason for CRM: Received call from [facility] per Bonnie ph: [phone] fax: [phone] regarding prior authorization for referral #[id number]. Pt has scheduled appt on Tues. Oct. 14th please call [phone] secure line.

## 2024-05-27 ENCOUNTER — Ambulatory Visit: Payer: Self-pay | Admitting: Internal Medicine

## 2024-05-27 ENCOUNTER — Ambulatory Visit
Admission: RE | Admit: 2024-05-27 | Discharge: 2024-05-27 | Disposition: A | Discharge status: Home / Self Care | Payer: Medicare (Managed Care) | Source: Ambulatory Visit | Attending: Internal Medicine | Admitting: Internal Medicine

## 2024-05-27 DIAGNOSIS — Z87891 Personal history of nicotine dependence: Secondary | ICD-10-CM | POA: Diagnosis not present

## 2024-05-27 DIAGNOSIS — R062 Wheezing: Secondary | ICD-10-CM

## 2024-05-27 DIAGNOSIS — F17200 Nicotine dependence, unspecified, uncomplicated: Secondary | ICD-10-CM

## 2024-05-27 DIAGNOSIS — E041 Nontoxic single thyroid nodule: Secondary | ICD-10-CM | POA: Diagnosis not present

## 2024-05-28 ENCOUNTER — Other Ambulatory Visit (HOSPITAL_COMMUNITY): Payer: Self-pay

## 2024-05-28 ENCOUNTER — Telehealth: Payer: Self-pay

## 2024-05-28 NOTE — Telephone Encounter (Signed)
 Pharmacy Patient Advocate Encounter   Received notification from Onbase that prior authorization for [employer]  is required/requested.   Insurance verification completed.   The patient is insured through [facility].   Per test claim: Per test claim, medication is not covered due to plan/benefit exclusion, PA not submitted at this time  Per test claim, member must be enrolled and obtain medication through [facility]

## 2024-07-14 ENCOUNTER — Other Ambulatory Visit: Payer: Self-pay

## 2024-07-14 ENCOUNTER — Encounter (HOSPITAL_COMMUNITY): Payer: Self-pay

## 2024-07-14 ENCOUNTER — Emergency Department (HOSPITAL_COMMUNITY)

## 2024-07-14 ENCOUNTER — Emergency Department (HOSPITAL_COMMUNITY)
Admission: EM | Admit: 2024-07-14 | Discharge: 2024-07-15 | Disposition: A | Attending: Emergency Medicine | Admitting: Emergency Medicine

## 2024-07-14 DIAGNOSIS — N179 Acute kidney failure, unspecified: Secondary | ICD-10-CM | POA: Diagnosis not present

## 2024-07-14 DIAGNOSIS — R42 Dizziness and giddiness: Secondary | ICD-10-CM | POA: Diagnosis not present

## 2024-07-14 DIAGNOSIS — Z7982 Long term (current) use of aspirin: Secondary | ICD-10-CM | POA: Insufficient documentation

## 2024-07-14 DIAGNOSIS — E860 Dehydration: Secondary | ICD-10-CM | POA: Diagnosis not present

## 2024-07-14 DIAGNOSIS — I10 Essential (primary) hypertension: Secondary | ICD-10-CM | POA: Diagnosis not present

## 2024-07-14 DIAGNOSIS — R55 Syncope and collapse: Secondary | ICD-10-CM | POA: Diagnosis not present

## 2024-07-14 DIAGNOSIS — E039 Hypothyroidism, unspecified: Secondary | ICD-10-CM | POA: Diagnosis not present

## 2024-07-14 LAB — CBG MONITORING, ED: Glucose-Capillary: 110 mg/dL — ABNORMAL HIGH (ref 70–99)

## 2024-07-14 LAB — CBC WITH DIFFERENTIAL/PLATELET
Abs Immature Granulocytes: 0.04 K/uL (ref 0.00–0.07)
Basophils Absolute: 0.1 K/uL (ref 0.0–0.1)
Basophils Relative: 1 %
Eosinophils Absolute: 0.3 K/uL (ref 0.0–0.5)
Eosinophils Relative: 3 %
HCT: 44.3 % (ref 36.0–46.0)
Hemoglobin: 13.8 g/dL (ref 12.0–15.0)
Immature Granulocytes: 0 %
Lymphocytes Relative: 13 %
Lymphs Abs: 1.3 K/uL (ref 0.7–4.0)
MCH: 34.7 pg — ABNORMAL HIGH (ref 26.0–34.0)
MCHC: 31.2 g/dL (ref 30.0–36.0)
MCV: 111.3 fL — ABNORMAL HIGH (ref 80.0–100.0)
Monocytes Absolute: 0.5 K/uL (ref 0.1–1.0)
Monocytes Relative: 5 %
Neutro Abs: 7.8 K/uL — ABNORMAL HIGH (ref 1.7–7.7)
Neutrophils Relative %: 78 %
Platelets: 285 K/uL (ref 150–400)
RBC: 3.98 MIL/uL (ref 3.87–5.11)
RDW: 13.7 % (ref 11.5–15.5)
WBC: 10 K/uL (ref 4.0–10.5)
nRBC: 0 % (ref 0.0–0.2)

## 2024-07-14 LAB — URINALYSIS, ROUTINE W REFLEX MICROSCOPIC
Bilirubin Urine: NEGATIVE
Glucose, UA: NEGATIVE mg/dL
Hgb urine dipstick: NEGATIVE
Ketones, ur: NEGATIVE mg/dL
Nitrite: NEGATIVE
Protein, ur: 30 mg/dL — AB
Specific Gravity, Urine: 1.024 (ref 1.005–1.030)
Squamous Epithelial / HPF: >50 /HPF (ref 0–5)
pH: 5 (ref 5.0–8.0)

## 2024-07-14 LAB — COMPREHENSIVE METABOLIC PANEL WITH GFR
ALT: 12 U/L (ref 0–44)
AST: 20 U/L (ref 15–41)
Albumin: 3.1 g/dL — ABNORMAL LOW (ref 3.5–5.0)
Alkaline Phosphatase: 51 U/L (ref 38–126)
Anion gap: 9 (ref 5–15)
BUN: 13 mg/dL (ref 6–20)
CO2: 22 mmol/L (ref 22–32)
Calcium: 8.1 mg/dL — ABNORMAL LOW (ref 8.9–10.3)
Chloride: 105 mmol/L (ref 98–111)
Creatinine, Ser: 1.43 mg/dL — ABNORMAL HIGH (ref 0.44–1.00)
GFR, Estimated: 45 mL/min — ABNORMAL LOW (ref >60)
Glucose, Bld: 103 mg/dL — ABNORMAL HIGH (ref 70–99)
Potassium: 3.5 mmol/L (ref 3.5–5.1)
Sodium: 136 mmol/L (ref 135–145)
Total Bilirubin: 0.5 mg/dL (ref 0.0–1.2)
Total Protein: 6.1 g/dL — ABNORMAL LOW (ref 6.5–8.1)

## 2024-07-14 LAB — TROPONIN I (HIGH SENSITIVITY): Troponin I (High Sensitivity): <2 ng/L (ref <18)

## 2024-07-14 LAB — HCG, SERUM, QUALITATIVE: Preg, Serum: NEGATIVE

## 2024-07-14 LAB — MAGNESIUM: Magnesium: 2 mg/dL (ref 1.7–2.4)

## 2024-07-14 LAB — PREGNANCY, URINE: Preg Test, Ur: NEGATIVE

## 2024-07-14 MED ORDER — LACTATED RINGERS IV BOLUS
1000.0000 mL | Freq: Once | INTRAVENOUS | Status: AC
  Administered 2024-07-14: 1000 mL via INTRAVENOUS

## 2024-07-14 NOTE — ED Triage Notes (Signed)
 Pt arrived via [facility] c/o dizziness after x 1 alcoholic drink. Pt then had diarrhea x 1 at bar, then a second bm enroute. Per ems pt was diaphoretic and systolic of 84 when they arrived on scene

## 2024-07-14 NOTE — ED Provider Notes (Signed)
 [place] EMERGENCY DEPARTMENT AT [facility] Provider Note   CSN: [id number] Arrival date & time: 07/14/24  2015     Patient presents with: Hypotension   Dana Miller is a 48 y.o. female.  History of anxiety, depression, hypothyroidism, HTN, HLD, migraine, RLS, microvascular heart disease presenting with near syncopal episode today.  History per patient.  Endorses she was at a bar, had a single drink, went to the bathroom, did not use the bathroom and suddenly felt very lightheaded, like she was going to pass out.  Did not have a syncopal episode, denies hitting her head.  She endorses that she went and sat down, and felt little bit better.  She then went to use the bathroom again, began feeling lightheaded, and then had episode of diarrhea.  She went back and sat down, continue to feel very lightheaded, therefore EMS was called.  Patient initially hypotensive 80/50 on arrival, given 100 cc of fluid with improvement in BP, systolics in the low 100s.  Felt significantly improved after receiving fluid bolus.  Endorses diarrhea started after she initially felt near syncopal episode today.  Denies chest pain, shortness of breath, or palpitations associated.  Denies fever or chills, recent illnesses.  Denies any symptoms at this time except for continued mild lightheadedness, denies any head trauma, chest trauma.  Denies any chest pain or shortness of breath earlier today.  Patient endorses that she has been compliant with her medications.   HPI     Prior to Admission medications   Medication Sig Start Date End Date Taking? Authorizing Provider  albuterol  (VENTOLIN  HFA) 108 (90 Base) MCG/ACT inhaler Inhale 2 puffs into the lungs every 6 (six) hours as needed for wheezing or shortness of breath. 05/22/24   Antonette Angeline ORN, NP  amLODipine  (NORVASC ) 10 MG tablet Take 1 tablet by mouth daily. 04/25/24   Antonette Angeline ORN, NP  aspirin EC 81 MG tablet Take 1 tablet by mouth daily.    [provider]  atorvastatin  (LIPITOR) 20 MG tablet Take 1 tablet by mouth daily. 12/05/23   Antonette Angeline ORN, NP  busPIRone  (BUSPAR ) 5 MG tablet Take 1 tablet by mouth twice daily. 12/05/23   Antonette Angeline ORN, NP  carvedilol  (COREG ) 25 MG tablet Take 1 tablet by mouth twice daily. 04/25/24   Antonette Angeline ORN, NP  EPINEPHrine  0.3 mg/0.3 mL IJ SOAJ injection Inject 0.3 mg into the muscle as needed. For Anaphylaxis 11/20/23   Antonette Angeline ORN, NP  fexofenadine (ALLEGRA) 180 MG tablet Take 1 tablet by mouth daily.    [provider]  hydrOXYzine  (ATARAX ) 10 MG tablet Take 1 tablet by mouth twice daily as needed. 12/05/23   Antonette Angeline ORN, NP  nitroGLYCERIN  (NITROSTAT ) 0.4 MG SL tablet Dissolve 1 tablet under the tongue every 5 minutes as needed for chest pain. Maximum dose 3 tablets per 24 hours period. 11/29/23   Antonette Angeline ORN, NP  ondansetron  (ZOFRAN -ODT) 4 MG disintegrating tablet Take 1 tablet (4 mg total) by mouth every 8 (eight) hours as needed for nausea or vomiting. 11/20/23   Antonette Angeline ORN, NP  pramipexole  (MIRAPEX ) 0.5 MG tablet Take 1 tablet by mouth at bedtime. 05/13/24   Antonette Angeline ORN, NP  predniSONE  (DELTASONE ) 10 MG tablet Take 6 tabs on day 1, 5 tabs on day 2, 4 tabs on day 3, 3 tabs on day 4, 2 tabs on day 5, 1 tab on day 6 05/22/24   Antonette Angeline ORN, NP  QUEtiapine  (SEROQUEL ) 50 MG tablet Take 1/2 to 1 tablet by mouth twice daily. 02/05/24   Antonette Angeline ORN, NP  ranolazine  (RANEXA ) 500 MG 12 hr tablet Take 2 tablets by mouth daily. 05/13/24   Antonette Angeline ORN, NP  semaglutide -weight management (WEGOVY ) 1 MG/0.5ML SOAJ SQ injection Inject 1 mg into the skin once a week. 05/23/24   Antonette Angeline ORN, NP  SUMAtriptan  (IMITREX ) 25 MG tablet Take 25 mg by mouth every 2 (two) hours as needed for migraine. 08/05/20   [provider]  topiramate  (TOPAMAX ) 100 MG tablet Take 1 tablet by mouth daily. 12/05/23   Antonette Angeline ORN, NP  venlafaxine  XR (EFFEXOR -XR) 75 MG 24 hr capsule Take 3  capsules by mouth daily with breakfast. 02/05/24   Antonette Angeline ORN, NP    Allergies: Fentanyl, Mirtazapine, Penicillins, Sulfa antibiotics, and Ms contin  [morphine]    Review of Systems  Updated Vital Signs BP 96/74 (BP Location: Left Arm)   Pulse 68   Temp (!) 97.4 F (36.3 C) (Oral)   Resp 18   Ht 5' 6 (1.676 m)   Wt 93 kg   LMP 06/30/2024 (Approximate)   SpO2 99%   BMI 33.09 kg/m   Physical Exam Vitals and nursing note reviewed.  Constitutional:      General: She is not in acute distress.    Appearance: She is well-developed.  HENT:     Head: Normocephalic and atraumatic.  Eyes:     Conjunctiva/sclera: Conjunctivae normal.  Cardiovascular:     Rate and Rhythm: Normal rate and regular rhythm.     Heart sounds: No murmur heard. Pulmonary:     Effort: Pulmonary effort is normal. No respiratory distress.     Breath sounds: Normal breath sounds.  Abdominal:     Palpations: Abdomen is soft.     Tenderness: There is no abdominal tenderness.  Musculoskeletal:        General: No swelling.     Cervical back: Neck supple.  Skin:    General: Skin is warm and dry.     Capillary Refill: Capillary refill takes less than 2 seconds.     Coloration: Skin is not pale.     Findings: No erythema or rash.  Neurological:     General: No focal deficit present.     Mental Status: She is alert and oriented to person, place, and time.     Cranial Nerves: No cranial nerve deficit.     Sensory: No sensory deficit.     Motor: No weakness.     Coordination: Coordination normal.  Psychiatric:        Mood and Affect: Mood normal.     (all labs ordered are listed, but only abnormal results are displayed) Labs Reviewed  COMPREHENSIVE METABOLIC PANEL WITH GFR - Abnormal; Notable for the following components:      Result Value   Glucose, Bld 103 (*)    Creatinine, Ser 1.43 (*)    Calcium  8.1 (*)    Total Protein 6.1 (*)    Albumin 3.1 (*)    GFR, Estimated 45 (*)    All other  components within normal limits  URINALYSIS, ROUTINE W REFLEX MICROSCOPIC - Abnormal; Notable for the following components:   Color, Urine AMBER (*)    APPearance CLOUDY (*)    Protein, ur 30 (*)    Leukocytes,Ua SMALL (*)    Bacteria, UA MANY (*)    All other components within normal limits  CBC  WITH DIFFERENTIAL/PLATELET - Abnormal; Notable for the following components:   MCV 111.3 (*)    MCH 34.7 (*)    Neutro Abs 7.8 (*)    All other components within normal limits  CBG MONITORING, ED - Abnormal; Notable for the following components:   Glucose-Capillary 110 (*)    All other components within normal limits  MAGNESIUM  HCG, SERUM, QUALITATIVE  PREGNANCY, URINE  CBC WITH DIFFERENTIAL/PLATELET  TROPONIN I (HIGH SENSITIVITY)  TROPONIN I (HIGH SENSITIVITY)    EKG: None  Radiology: DG Chest Portable 1 View Result Date: 07/14/2024 CLINICAL DATA:  Dizziness. EXAM: PORTABLE CHEST 1 VIEW COMPARISON:  May 09, 2023 FINDINGS: The heart size and mediastinal contours are within normal limits. Both lungs are clear. Surgical clips are seen overlying the right neck soft tissues. The visualized skeletal structures are unremarkable. IMPRESSION: No active disease. Electronically Signed   By: Suzen Dials M.D.   On: 07/14/2024 21:16     Procedures   Medications Ordered in the ED  lactated ringers  bolus 1,000 mL (0 mLs Intravenous Stopped 07/14/24 2306)    Clinical Course as of 07/15/24 0041  Mon Jul 14, 2024  2332 Stable  48 YOF with a chief complaint of NS S/p ETOH 80s/50s with ems and responded to IVF [CC]    Clinical Course User Index [CC] Jerral Meth, MD                                 Medical Decision Making Amount and/or Complexity of Data Reviewed Labs: ordered. Radiology: ordered.   Based on patient presentation, history, evaluation, high suspicion for near syncope in the setting of dehydration, AKI, and alcohol intoxication.  Patient was drinking prior  to near syncopal episode, and then had multiple episodes of diarrhea.  Patient with evidence of AKI on evaluation, and appears dehydrated, provided fluid resuscitation with significant improvement of mental status as well with blood pressure.  Patient was able to walk around without difficulty, no lightheadedness or dizziness associated.  She endorses that she feels significantly improved with overall medical management today.  Her workup today, I have low suspicion for acute electrolyte derangement versus anemia versus ACS versus UTI versus pregnancy versus ectopic pregnancy versus PE versus pneumothorax versus pneumonia versus pneumonitis versus pericarditis versus myocarditis.  Overall reassuring workup, significant improvement in patient presentation, patient is overall stable for discharge at this time.  Recommended to drink lots of fluids at home to continue dehydration management.  Also recommended to follow-up with PCP in 3 to 4 days.     Final diagnoses:  AKI (acute kidney injury)  Dehydration  Near syncope    ED Discharge Orders     None          Arlee Katz, MD 07/15/24 9958    Tonia Chew, MD 07/19/24 [phone]

## 2024-07-15 LAB — TROPONIN I (HIGH SENSITIVITY): Troponin I (High Sensitivity): <2 ng/L (ref <18)

## 2024-07-27 ENCOUNTER — Other Ambulatory Visit: Payer: Self-pay | Admitting: Internal Medicine

## 2024-07-27 DIAGNOSIS — R252 Cramp and spasm: Secondary | ICD-10-CM

## 2024-07-30 NOTE — Telephone Encounter (Signed)
 Requested Prescriptions  Pending Prescriptions Disp Refills   pramipexole  (MIRAPEX ) 0.5 MG tablet [Pharmacy Med Name: Pramipexole  Dihydrochloride 0.5mg  Tablet] 90 tablet 0    Sig: Take 1 tablet by mouth at bedtime.     Neurology:  Parkinsonian Agents Passed - 07/30/2024  9:28 AM      Passed - Last BP in normal range    BP Readings from Last 1 Encounters:  07/14/24 96/74         Passed - Last Heart Rate in normal range    Pulse Readings from Last 1 Encounters:  07/14/24 68         Passed - Valid encounter within last 12 months    Recent Outpatient Visits           2 months ago Cardiac microvascular disease   [facility] [place], Angeline ORN, NP   8 months ago Encounter for general adult medical examination with abnormal findings   [place] [facility] [place], Angeline ORN, NP               amLODipine  (NORVASC ) 10 MG tablet 90 tablet 0    Sig: Take 1 tablet by mouth daily.     Cardiovascular: Calcium  Channel Blockers 2 Passed - 07/30/2024  9:28 AM      Passed - Last BP in normal range    BP Readings from Last 1 Encounters:  07/14/24 96/74         Passed - Last Heart Rate in normal range    Pulse Readings from Last 1 Encounters:  07/14/24 68         Passed - Valid encounter within last 6 months    Recent Outpatient Visits           2 months ago Cardiac microvascular disease   [place] [place], Angeline ORN, NP   8 months ago Encounter for general adult medical examination with abnormal findings   [facility] [place], Angeline ORN, NP               QUEtiapine  (SEROQUEL ) 50 MG tablet [Pharmacy Med Name: Quetiapine  Fumarate 50mg  Tablet] 180 tablet 0    Sig: Take 1/2 to 1 tablet by mouth twice daily.     Not Delegated - Psychiatry:  Antipsychotics - Second Generation (Atypical) - quetiapine  Failed - 07/30/2024  9:28 AM      Failed - This refill cannot be delegated       Failed - TSH in normal range and within 360 days    TSH  Date Value Ref Range Status  12/08/2021 2.27 mIU/L Final    Comment:              Reference Range .           > or = 20 Years  0.40-4.50 .                Pregnancy Ranges           First trimester    0.26-2.66           Second trimester   0.55-2.73           Third trimester    0.43-2.91          Failed - Lipid Panel in normal range within the last 12 months    Cholesterol  Date Value Ref Range Status  11/20/2023 185 <200 mg/dL Final  LDL Cholesterol (Calc)  Date Value Ref Range Status  11/20/2023 70 mg/dL (calc) Final    Comment:    Reference range: <100 . Desirable range <100 mg/dL for primary prevention;   <70 mg/dL for patients with CHD or diabetic patients  with > or = 2 CHD risk factors. SABRA LDL-C is now calculated using the Martin-Hopkins  calculation, which is a validated novel method providing  better accuracy than the Friedewald equation in the  estimation of LDL-C.  Gladis APPLETHWAITE et al. SANDREA. 7986;689(80): 2061-2068  (http://education.[facility].com/faq/FAQ164)    HDL  Date Value Ref Range Status  11/20/2023 97 > OR = 50 mg/dL Final   Triglycerides  Date Value Ref Range Status  11/20/2023 92 <150 mg/dL Final         Passed - Completed PHQ-2 or PHQ-9 in the last 360 days      Passed - Last BP in normal range    BP Readings from Last 1 Encounters:  07/14/24 96/74         Passed - Last Heart Rate in normal range    Pulse Readings from Last 1 Encounters:  07/14/24 68         Passed - Valid encounter within last 6 months    Recent Outpatient Visits           2 months ago Cardiac microvascular disease   [place] [facility] [place], Angeline ORN, NP   8 months ago Encounter for general adult medical examination with abnormal findings   [facility] [place], Angeline ORN, NP              Passed - CBC within normal limits and completed in the last 12  months    WBC  Date Value Ref Range Status  07/14/2024 10.0 4.0 - 10.5 K/uL Final   RBC  Date Value Ref Range Status  07/14/2024 3.98 3.87 - 5.11 MIL/uL Final   Hemoglobin  Date Value Ref Range Status  07/14/2024 13.8 12.0 - 15.0 g/dL Final   HCT  Date Value Ref Range Status  07/14/2024 44.3 36.0 - 46.0 % Final   MCHC  Date Value Ref Range Status  07/14/2024 31.2 30.0 - 36.0 g/dL Final   [facility]  Date Value Ref Range Status  07/14/2024 34.7 (H) 26.0 - 34.0 pg Final   MCV  Date Value Ref Range Status  07/14/2024 111.3 (H) 80.0 - 100.0 fL Final   No results found for: PLTCOUNTKUC, LABPLAT, POCPLA RDW  Date Value Ref Range Status  07/14/2024 13.7 11.5 - 15.5 % Final         Passed - CMP within normal limits and completed in the last 12 months    Albumin  Date Value Ref Range Status  07/14/2024 3.1 (L) 3.5 - 5.0 g/dL Final   Alkaline Phosphatase  Date Value Ref Range Status  07/14/2024 51 38 - 126 U/L Final   Alkaline phosphatase (APISO)  Date Value Ref Range Status  11/20/2023 66 31 - 125 U/L Final   ALT  Date Value Ref Range Status  07/14/2024 12 0 - 44 U/L Final   AST  Date Value Ref Range Status  07/14/2024 20 15 - 41 U/L Final   BUN  Date Value Ref Range Status  07/14/2024 13 6 - 20 mg/dL Final   Calcium   Date Value Ref Range Status  07/14/2024 8.1 (L) 8.9 - 10.3 mg/dL Final   CO2  Date Value Ref  Range Status  07/14/2024 22 22 - 32 mmol/L Final   Creat  Date Value Ref Range Status  11/20/2023 1.17 (H) 0.50 - 0.99 mg/dL Final   Creatinine, Ser  Date Value Ref Range Status  07/14/2024 1.43 (H) 0.44 - 1.00 mg/dL Final   Glucose, Bld  Date Value Ref Range Status  07/14/2024 103 (H) 70 - 99 mg/dL Final    Comment:    Glucose reference range applies only to samples taken after fasting for at least 8 hours.   Glucose-Capillary  Date Value Ref Range Status  07/14/2024 110 (H) 70 - 99 mg/dL Final    Comment:    Glucose reference range  applies only to samples taken after fasting for at least 8 hours.   Potassium  Date Value Ref Range Status  07/14/2024 3.5 3.5 - 5.1 mmol/L Final   Sodium  Date Value Ref Range Status  07/14/2024 136 135 - 145 mmol/L Final   Total Bilirubin  Date Value Ref Range Status  07/14/2024 0.5 0.0 - 1.2 mg/dL Final   Protein, ur  Date Value Ref Range Status  07/14/2024 30 (A) NEGATIVE mg/dL Final   Total Protein  Date Value Ref Range Status  07/14/2024 6.1 (L) 6.5 - 8.1 g/dL Final   GFR  Date Value Ref Range Status  07/15/2020 49.68 (L) >60.00 mL/min Final    Comment:    Calculated using the CKD-EPI Creatinine Equation (2021)   eGFR  Date Value Ref Range Status  11/20/2023 58 (L) > OR = 60 mL/min/1.9m2 Final   GFR, Estimated  Date Value Ref Range Status  07/14/2024 45 (L) >60 mL/min Final    Comment:    (NOTE) Calculated using the CKD-EPI Creatinine Equation (2021)           carvedilol  (COREG ) 25 MG tablet 180 tablet 0    Sig: Take 1 tablet by mouth twice daily.     Cardiovascular: Beta Blockers 3 Failed - 07/30/2024  9:28 AM      Failed - Cr in normal range and within 360 days    Creat  Date Value Ref Range Status  11/20/2023 1.17 (H) 0.50 - 0.99 mg/dL Final   Creatinine, Ser  Date Value Ref Range Status  07/14/2024 1.43 (H) 0.44 - 1.00 mg/dL Final         Passed - AST in normal range and within 360 days    AST  Date Value Ref Range Status  07/14/2024 20 15 - 41 U/L Final         Passed - ALT in normal range and within 360 days    ALT  Date Value Ref Range Status  07/14/2024 12 0 - 44 U/L Final         Passed - Last BP in normal range    BP Readings from Last 1 Encounters:  07/14/24 96/74         Passed - Last Heart Rate in normal range    Pulse Readings from Last 1 Encounters:  07/14/24 68         Passed - Valid encounter within last 6 months    Recent Outpatient Visits           2 months ago Cardiac microvascular disease   [place]  [facility] [place], Angeline ORN, NP   8 months ago Encounter for general adult medical examination with abnormal findings   [place] [facility] [place], Angeline ORN, NP  venlafaxine  XR (EFFEXOR -XR) 75 MG 24 hr capsule [Pharmacy Med Name: Venlafaxine  Hydrochloride 75mg  Extended-Release Capsule] 270 capsule 0    Sig: Take 3 capsules by mouth daily with breakfast.     Psychiatry: Antidepressants - SNRI - desvenlafaxine & venlafaxine  Failed - 07/30/2024  9:28 AM      Failed - Cr in normal range and within 360 days    Creat  Date Value Ref Range Status  11/20/2023 1.17 (H) 0.50 - 0.99 mg/dL Final   Creatinine, Ser  Date Value Ref Range Status  07/14/2024 1.43 (H) 0.44 - 1.00 mg/dL Final         Failed - Lipid Panel in normal range within the last 12 months    Cholesterol  Date Value Ref Range Status  11/20/2023 185 <200 mg/dL Final   LDL Cholesterol (Calc)  Date Value Ref Range Status  11/20/2023 70 mg/dL (calc) Final    Comment:    Reference range: <100 . Desirable range <100 mg/dL for primary prevention;   <70 mg/dL for patients with CHD or diabetic patients  with > or = 2 CHD risk factors. SABRA LDL-C is now calculated using the Martin-Hopkins  calculation, which is a validated novel method providing  better accuracy than the Friedewald equation in the  estimation of LDL-C.  Gladis APPLETHWAITE et al. SANDREA. 7986;689(80): 2061-2068  (http://education.[facility].com/faq/FAQ164)    HDL  Date Value Ref Range Status  11/20/2023 97 > OR = 50 mg/dL Final   Triglycerides  Date Value Ref Range Status  11/20/2023 92 <150 mg/dL Final         Passed - Completed PHQ-2 or PHQ-9 in the last 360 days      Passed - Last BP in normal range    BP Readings from Last 1 Encounters:  07/14/24 96/74         Passed - Valid encounter within last 6 months    Recent Outpatient Visits           2 months ago Cardiac microvascular disease   [facility] [place], Angeline ORN, NP   8 months ago Encounter for general adult medical examination with abnormal findings   [facility] [place], Angeline ORN, NP

## 2024-07-30 NOTE — Telephone Encounter (Signed)
 Requested medication (s) are due for refill today: yes  Requested medication (s) are on the active medication list: yes  Last refill:  02/05/24                                       Future visit scheduled: {Yes  Notes to clinic:  Unable to refill per protocol, cannot delegate.      Requested Prescriptions  Pending Prescriptions Disp Refills   QUEtiapine  (SEROQUEL ) 50 MG tablet [Pharmacy Med Name: Quetiapine  Fumarate 50mg  Tablet] 180 tablet 0    Sig: Take 1/2 to 1 tablet by mouth twice daily.     Not Delegated - Psychiatry:  Antipsychotics - Second Generation (Atypical) - quetiapine  Failed - 07/30/2024  9:29 AM      Failed - This refill cannot be delegated      Failed - TSH in normal range and within 360 days    TSH  Date Value Ref Range Status  12/08/2021 2.27 mIU/L Final    Comment:              Reference Range .           > or = 20 Years  0.40-4.50 .                Pregnancy Ranges           First trimester    0.26-2.66           Second trimester   0.55-2.73           Third trimester    0.43-2.91          Failed - Lipid Panel in normal range within the last 12 months    Cholesterol  Date Value Ref Range Status  11/20/2023 185 <200 mg/dL Final   LDL Cholesterol (Calc)  Date Value Ref Range Status  11/20/2023 70 mg/dL (calc) Final    Comment:    Reference range: <100 . Desirable range <100 mg/dL for primary prevention;   <70 mg/dL for patients with CHD or diabetic patients  with > or = 2 CHD risk factors. SABRA LDL-C is now calculated using the Martin-Hopkins  calculation, which is a validated novel method providing  better accuracy than the Friedewald equation in the  estimation of LDL-C.  Gladis APPLETHWAITE et al. SANDREA. 7986;689(80): 2061-2068  (http://education.[facility].com/faq/FAQ164)    HDL  Date Value Ref Range Status  11/20/2023 97 > OR = 50 mg/dL Final   Triglycerides  Date Value Ref Range Status  11/20/2023 92 <150 mg/dL Final         Passed -  Completed PHQ-2 or PHQ-9 in the last 360 days      Passed - Last BP in normal range    BP Readings from Last 1 Encounters:  07/14/24 96/74         Passed - Last Heart Rate in normal range    Pulse Readings from Last 1 Encounters:  07/14/24 68         Passed - Valid encounter within last 6 months    Recent Outpatient Visits           2 months ago Cardiac microvascular disease   [place] [facility] [place], Angeline ORN, NP   8 months ago Encounter for general adult medical examination with abnormal findings   [facility] [place], [place],  NP              Passed - CBC within normal limits and completed in the last 12 months    WBC  Date Value Ref Range Status  07/14/2024 10.0 4.0 - 10.5 K/uL Final   RBC  Date Value Ref Range Status  07/14/2024 3.98 3.87 - 5.11 MIL/uL Final   Hemoglobin  Date Value Ref Range Status  07/14/2024 13.8 12.0 - 15.0 g/dL Final   HCT  Date Value Ref Range Status  07/14/2024 44.3 36.0 - 46.0 % Final   MCHC  Date Value Ref Range Status  07/14/2024 31.2 30.0 - 36.0 g/dL Final   [facility]  Date Value Ref Range Status  07/14/2024 34.7 (H) 26.0 - 34.0 pg Final   MCV  Date Value Ref Range Status  07/14/2024 111.3 (H) 80.0 - 100.0 fL Final   No results found for: PLTCOUNTKUC, LABPLAT, POCPLA RDW  Date Value Ref Range Status  07/14/2024 13.7 11.5 - 15.5 % Final         Passed - CMP within normal limits and completed in the last 12 months    Albumin  Date Value Ref Range Status  07/14/2024 3.1 (L) 3.5 - 5.0 g/dL Final   Alkaline Phosphatase  Date Value Ref Range Status  07/14/2024 51 38 - 126 U/L Final   Alkaline phosphatase (APISO)  Date Value Ref Range Status  11/20/2023 66 31 - 125 U/L Final   ALT  Date Value Ref Range Status  07/14/2024 12 0 - 44 U/L Final   AST  Date Value Ref Range Status  07/14/2024 20 15 - 41 U/L Final   BUN  Date Value Ref Range Status  07/14/2024 13 6  - 20 mg/dL Final   Calcium   Date Value Ref Range Status  07/14/2024 8.1 (L) 8.9 - 10.3 mg/dL Final   CO2  Date Value Ref Range Status  07/14/2024 22 22 - 32 mmol/L Final   Creat  Date Value Ref Range Status  11/20/2023 1.17 (H) 0.50 - 0.99 mg/dL Final   Creatinine, Ser  Date Value Ref Range Status  07/14/2024 1.43 (H) 0.44 - 1.00 mg/dL Final   Glucose, Bld  Date Value Ref Range Status  07/14/2024 103 (H) 70 - 99 mg/dL Final    Comment:    Glucose reference range applies only to samples taken after fasting for at least 8 hours.   Glucose-Capillary  Date Value Ref Range Status  07/14/2024 110 (H) 70 - 99 mg/dL Final    Comment:    Glucose reference range applies only to samples taken after fasting for at least 8 hours.   Potassium  Date Value Ref Range Status  07/14/2024 3.5 3.5 - 5.1 mmol/L Final   Sodium  Date Value Ref Range Status  07/14/2024 136 135 - 145 mmol/L Final   Total Bilirubin  Date Value Ref Range Status  07/14/2024 0.5 0.0 - 1.2 mg/dL Final   Protein, ur  Date Value Ref Range Status  07/14/2024 30 (A) NEGATIVE mg/dL Final   Total Protein  Date Value Ref Range Status  07/14/2024 6.1 (L) 6.5 - 8.1 g/dL Final   GFR  Date Value Ref Range Status  07/15/2020 49.68 (L) >60.00 mL/min Final    Comment:    Calculated using the CKD-EPI Creatinine Equation (2021)   eGFR  Date Value Ref Range Status  11/20/2023 58 (L) > OR = 60 mL/min/1.80m2 Final   GFR, Estimated  Date Value Ref  Range Status  07/14/2024 45 (L) >60 mL/min Final    Comment:    (NOTE) Calculated using the CKD-EPI Creatinine Equation (2021)          Signed Prescriptions Disp Refills   pramipexole  (MIRAPEX ) 0.5 MG tablet 90 tablet 0    Sig: Take 1 tablet by mouth at bedtime.     Neurology:  Parkinsonian Agents Passed - 07/30/2024  9:29 AM      Passed - Last BP in normal range    BP Readings from Last 1 Encounters:  07/14/24 96/74         Passed - Last Heart Rate in normal  range    Pulse Readings from Last 1 Encounters:  07/14/24 68         Passed - Valid encounter within last 12 months    Recent Outpatient Visits           2 months ago Cardiac microvascular disease   [place] [facility] [place], Angeline ORN, NP   8 months ago Encounter for general adult medical examination with abnormal findings   [place] [facility] [place], Angeline ORN, NP               amLODipine  (NORVASC ) 10 MG tablet 90 tablet 0    Sig: Take 1 tablet by mouth daily.     Cardiovascular: Calcium  Channel Blockers 2 Passed - 07/30/2024  9:29 AM      Passed - Last BP in normal range    BP Readings from Last 1 Encounters:  07/14/24 96/74         Passed - Last Heart Rate in normal range    Pulse Readings from Last 1 Encounters:  07/14/24 68         Passed - Valid encounter within last 6 months    Recent Outpatient Visits           2 months ago Cardiac microvascular disease   [place] [facility] [place], Angeline ORN, NP   8 months ago Encounter for general adult medical examination with abnormal findings   [place] [facility] [place], Angeline ORN, NP               carvedilol  (COREG ) 25 MG tablet 180 tablet 0    Sig: Take 1 tablet by mouth twice daily.     Cardiovascular: Beta Blockers 3 Failed - 07/30/2024  9:29 AM      Failed - Cr in normal range and within 360 days    Creat  Date Value Ref Range Status  11/20/2023 1.17 (H) 0.50 - 0.99 mg/dL Final   Creatinine, Ser  Date Value Ref Range Status  07/14/2024 1.43 (H) 0.44 - 1.00 mg/dL Final         Passed - AST in normal range and within 360 days    AST  Date Value Ref Range Status  07/14/2024 20 15 - 41 U/L Final         Passed - ALT in normal range and within 360 days    ALT  Date Value Ref Range Status  07/14/2024 12 0 - 44 U/L Final         Passed - Last BP in normal range    BP Readings from Last 1 Encounters:  07/14/24 96/74          Passed - Last Heart Rate in normal range    Pulse Readings from Last 1  Encounters:  07/14/24 68         Passed - Valid encounter within last 6 months    Recent Outpatient Visits           2 months ago Cardiac microvascular disease   [place] [facility] [place], [place], NP   8 months ago Encounter for general adult medical examination with abnormal findings   [facility] [place], Angeline ORN, NP               venlafaxine  XR (EFFEXOR -XR) 75 MG 24 hr capsule 270 capsule 0    Sig: Take 3 capsules by mouth daily with breakfast.     Psychiatry: Antidepressants - SNRI - desvenlafaxine & venlafaxine  Failed - 07/30/2024  9:29 AM      Failed - Cr in normal range and within 360 days    Creat  Date Value Ref Range Status  11/20/2023 1.17 (H) 0.50 - 0.99 mg/dL Final   Creatinine, Ser  Date Value Ref Range Status  07/14/2024 1.43 (H) 0.44 - 1.00 mg/dL Final         Failed - Lipid Panel in normal range within the last 12 months    Cholesterol  Date Value Ref Range Status  11/20/2023 185 <200 mg/dL Final   LDL Cholesterol (Calc)  Date Value Ref Range Status  11/20/2023 70 mg/dL (calc) Final    Comment:    Reference range: <100 . Desirable range <100 mg/dL for primary prevention;   <70 mg/dL for patients with CHD or diabetic patients  with > or = 2 CHD risk factors. SABRA LDL-C is now calculated using the Martin-Hopkins  calculation, which is a validated novel method providing  better accuracy than the Friedewald equation in the  estimation of LDL-C.  Gladis APPLETHWAITE et al. SANDREA. 7986;689(80): 2061-2068  (http://education.[facility].com/faq/FAQ164)    HDL  Date Value Ref Range Status  11/20/2023 97 > OR = 50 mg/dL Final   Triglycerides  Date Value Ref Range Status  11/20/2023 92 <150 mg/dL Final         Passed - Completed PHQ-2 or PHQ-9 in the last 360 days      Passed - Last BP in normal range    BP Readings from  Last 1 Encounters:  07/14/24 96/74         Passed - Valid encounter within last 6 months    Recent Outpatient Visits           2 months ago Cardiac microvascular disease   [place] [facility] [place], Angeline ORN, NP   8 months ago Encounter for general adult medical examination with abnormal findings   [place] [facility] [place], Angeline ORN, NP

## 2024-08-07 ENCOUNTER — Telehealth

## 2024-08-07 ENCOUNTER — Telehealth: Admitting: Physician Assistant

## 2024-08-07 DIAGNOSIS — J069 Acute upper respiratory infection, unspecified: Secondary | ICD-10-CM | POA: Diagnosis not present

## 2024-08-07 MED ORDER — BENZONATATE 200 MG PO CAPS: 200.0000 mg | ORAL_CAPSULE | Freq: Three times a day (TID) | ORAL | 0 refills | Status: AC | PRN

## 2024-08-07 MED ORDER — FLUTICASONE PROPIONATE 50 MCG/ACT NA SUSP: 2.0000 | Freq: Every day | NASAL | 6 refills | Status: AC

## 2024-08-07 NOTE — Progress Notes (Signed)
 Message sent to patient, awaiting response.

## 2024-08-07 NOTE — Progress Notes (Signed)

## 2024-11-20 ENCOUNTER — Encounter: Payer: Medicare (Managed Care) | Admitting: Internal Medicine
